# Patient Record
Sex: Female | Born: 1955 | Race: Black or African American | Hispanic: No | Smoking: Current every day smoker
Health system: Southern US, Community
[De-identification: ages and names within clinical notes are randomized; demographics above are authoritative.]

## PROBLEM LIST (undated history)

## (undated) ENCOUNTER — Emergency Department (HOSPITAL_COMMUNITY): Admission: EM | Discharge status: Absent without leave | Payer: Self-pay

## (undated) DIAGNOSIS — J189 Pneumonia, unspecified organism: Secondary | ICD-10-CM

## (undated) DIAGNOSIS — J45909 Unspecified asthma, uncomplicated: Secondary | ICD-10-CM

## (undated) DIAGNOSIS — M1990 Unspecified osteoarthritis, unspecified site: Secondary | ICD-10-CM

## (undated) DIAGNOSIS — J449 Chronic obstructive pulmonary disease, unspecified: Secondary | ICD-10-CM

## (undated) DIAGNOSIS — N83209 Unspecified ovarian cyst, unspecified side: Secondary | ICD-10-CM

## (undated) DIAGNOSIS — I10 Essential (primary) hypertension: Secondary | ICD-10-CM

## (undated) HISTORY — DX: Chronic obstructive pulmonary disease, unspecified: J44.9

## (undated) HISTORY — DX: Pneumonia, unspecified organism: J18.9

## (undated) HISTORY — DX: Unspecified asthma, uncomplicated: J45.909

## (undated) HISTORY — PX: TUBAL LIGATION: SHX77

## (undated) HISTORY — DX: Unspecified ovarian cyst, unspecified side: N83.209

---

## 2014-05-10 ENCOUNTER — Other Ambulatory Visit (HOSPITAL_COMMUNITY): Payer: Self-pay | Admitting: Family

## 2014-05-10 DIAGNOSIS — R1031 Right lower quadrant pain: Secondary | ICD-10-CM

## 2014-05-10 DIAGNOSIS — R1011 Right upper quadrant pain: Secondary | ICD-10-CM

## 2014-05-13 ENCOUNTER — Ambulatory Visit (HOSPITAL_COMMUNITY): Admission: RE | Admit: 2014-05-13 | Payer: 59 | Source: Ambulatory Visit

## 2014-05-24 ENCOUNTER — Encounter: Payer: Self-pay | Admitting: Obstetrics and Gynecology

## 2014-05-25 ENCOUNTER — Encounter: Payer: Self-pay | Admitting: *Deleted

## 2014-05-25 DIAGNOSIS — Z720 Tobacco use: Secondary | ICD-10-CM | POA: Insufficient documentation

## 2014-06-02 ENCOUNTER — Encounter: Payer: Self-pay | Admitting: Obstetrics and Gynecology

## 2015-08-12 ENCOUNTER — Observation Stay (HOSPITAL_COMMUNITY)
Admission: EM | Admit: 2015-08-12 | Discharge: 2015-08-15 | Disposition: A | Discharge status: Home / Self Care | Payer: 59 | Attending: Internal Medicine | Admitting: Internal Medicine

## 2015-08-12 ENCOUNTER — Emergency Department (HOSPITAL_COMMUNITY): Payer: 59

## 2015-08-12 ENCOUNTER — Encounter (HOSPITAL_COMMUNITY): Payer: Self-pay | Admitting: Emergency Medicine

## 2015-08-12 DIAGNOSIS — T50905A Adverse effect of unspecified drugs, medicaments and biological substances, initial encounter: Secondary | ICD-10-CM

## 2015-08-12 DIAGNOSIS — J441 Chronic obstructive pulmonary disease with (acute) exacerbation: Secondary | ICD-10-CM | POA: Diagnosis not present

## 2015-08-12 DIAGNOSIS — R0603 Acute respiratory distress: Secondary | ICD-10-CM | POA: Diagnosis present

## 2015-08-12 DIAGNOSIS — Z7982 Long term (current) use of aspirin: Secondary | ICD-10-CM | POA: Insufficient documentation

## 2015-08-12 DIAGNOSIS — J9600 Acute respiratory failure, unspecified whether with hypoxia or hypercapnia: Secondary | ICD-10-CM

## 2015-08-12 DIAGNOSIS — R739 Hyperglycemia, unspecified: Secondary | ICD-10-CM | POA: Diagnosis present

## 2015-08-12 DIAGNOSIS — F17210 Nicotine dependence, cigarettes, uncomplicated: Secondary | ICD-10-CM | POA: Insufficient documentation

## 2015-08-12 DIAGNOSIS — Z720 Tobacco use: Secondary | ICD-10-CM | POA: Diagnosis present

## 2015-08-12 DIAGNOSIS — J45909 Unspecified asthma, uncomplicated: Secondary | ICD-10-CM | POA: Insufficient documentation

## 2015-08-12 LAB — BASIC METABOLIC PANEL
ANION GAP: 9 (ref 5–15)
BUN: 12 mg/dL (ref 6–20)
CALCIUM: 9 mg/dL (ref 8.9–10.3)
CO2: 26 mmol/L (ref 22–32)
Chloride: 102 mmol/L (ref 101–111)
Creatinine, Ser: 0.82 mg/dL (ref 0.44–1.00)
GLUCOSE: 97 mg/dL (ref 65–99)
POTASSIUM: 3.7 mmol/L (ref 3.5–5.1)
Sodium: 137 mmol/L (ref 135–145)

## 2015-08-12 LAB — CBC WITH DIFFERENTIAL/PLATELET
BASOS ABS: 0 10*3/uL (ref 0.0–0.1)
BASOS PCT: 0 %
Eosinophils Absolute: 0 10*3/uL (ref 0.0–0.7)
Eosinophils Relative: 0 %
HEMATOCRIT: 45.4 % (ref 36.0–46.0)
HEMOGLOBIN: 14.7 g/dL (ref 12.0–15.0)
LYMPHS PCT: 48 %
Lymphs Abs: 5.3 10*3/uL — ABNORMAL HIGH (ref 0.7–4.0)
MCH: 30.8 pg (ref 26.0–34.0)
MCHC: 32.4 g/dL (ref 30.0–36.0)
MCV: 95.2 fL (ref 78.0–100.0)
MONO ABS: 1.2 10*3/uL — AB (ref 0.1–1.0)
Monocytes Relative: 11 %
NEUTROS ABS: 4.5 10*3/uL (ref 1.7–7.7)
NEUTROS PCT: 41 %
Platelets: 402 10*3/uL — ABNORMAL HIGH (ref 150–400)
RBC: 4.77 MIL/uL (ref 3.87–5.11)
RDW: 13.2 % (ref 11.5–15.5)
WBC: 11.1 10*3/uL — ABNORMAL HIGH (ref 4.0–10.5)

## 2015-08-12 MED ORDER — ACETAMINOPHEN 650 MG RE SUPP: 650.0000 mg | Freq: Four times a day (QID) | RECTAL | Status: DC | PRN

## 2015-08-12 MED ORDER — MOMETASONE FURO-FORMOTEROL FUM 200-5 MCG/ACT IN AERO
2.0000 | INHALATION_SPRAY | Freq: Two times a day (BID) | RESPIRATORY_TRACT | Status: DC
  Administered 2015-08-12 – 2015-08-15 (×6): 2 via RESPIRATORY_TRACT
  Filled 2015-08-12: qty 8.8

## 2015-08-12 MED ORDER — ALBUTEROL SULFATE (2.5 MG/3ML) 0.083% IN NEBU: 2.5000 mg | INHALATION_SOLUTION | Freq: Four times a day (QID) | RESPIRATORY_TRACT | Status: DC

## 2015-08-12 MED ORDER — ASPIRIN EC 81 MG PO TBEC
81.0000 mg | DELAYED_RELEASE_TABLET | Freq: Every day | ORAL | Status: DC
  Administered 2015-08-12 – 2015-08-15 (×4): 81 mg via ORAL
  Filled 2015-08-12 (×4): qty 1

## 2015-08-12 MED ORDER — IPRATROPIUM-ALBUTEROL 0.5-2.5 (3) MG/3ML IN SOLN
3.0000 mL | Freq: Four times a day (QID) | RESPIRATORY_TRACT | Status: DC
  Administered 2015-08-12: 3 mL via RESPIRATORY_TRACT
  Filled 2015-08-12: qty 3

## 2015-08-12 MED ORDER — METHYLPREDNISOLONE SODIUM SUCC 125 MG IJ SOLR
60.0000 mg | Freq: Two times a day (BID) | INTRAMUSCULAR | Status: DC
  Administered 2015-08-13 (×3): 60 mg via INTRAVENOUS
  Filled 2015-08-12 (×3): qty 2

## 2015-08-12 MED ORDER — ENOXAPARIN SODIUM 40 MG/0.4ML ~~LOC~~ SOLN
40.0000 mg | SUBCUTANEOUS | Status: DC
  Administered 2015-08-12 – 2015-08-14 (×3): 40 mg via SUBCUTANEOUS
  Filled 2015-08-12 (×3): qty 0.4

## 2015-08-12 MED ORDER — NICOTINE 21 MG/24HR TD PT24
21.0000 mg | MEDICATED_PATCH | Freq: Every day | TRANSDERMAL | Status: DC
  Administered 2015-08-12 – 2015-08-15 (×4): 21 mg via TRANSDERMAL
  Filled 2015-08-12 (×4): qty 1

## 2015-08-12 MED ORDER — LEVOFLOXACIN IN D5W 750 MG/150ML IV SOLN
750.0000 mg | Freq: Once | INTRAVENOUS | Status: AC
  Administered 2015-08-12: 750 mg via INTRAVENOUS
  Filled 2015-08-12: qty 150

## 2015-08-12 MED ORDER — IPRATROPIUM BROMIDE 0.02 % IN SOLN
1.0000 mg | Freq: Once | RESPIRATORY_TRACT | Status: AC
Start: 2015-08-12 — End: 2015-08-12
  Administered 2015-08-12: 1 mg via RESPIRATORY_TRACT
  Filled 2015-08-12: qty 5

## 2015-08-12 MED ORDER — ALBUTEROL (5 MG/ML) CONTINUOUS INHALATION SOLN
10.0000 mg/h | INHALATION_SOLUTION | Freq: Once | RESPIRATORY_TRACT | Status: AC
  Administered 2015-08-12: 10 mg/h via RESPIRATORY_TRACT
  Filled 2015-08-12: qty 20

## 2015-08-12 MED ORDER — MAGNESIUM SULFATE 2 GM/50ML IV SOLN
2.0000 g | Freq: Once | INTRAVENOUS | Status: AC
  Administered 2015-08-12: 2 g via INTRAVENOUS
  Filled 2015-08-12: qty 50

## 2015-08-12 MED ORDER — LEVOFLOXACIN IN D5W 750 MG/150ML IV SOLN
750.0000 mg | INTRAVENOUS | Status: DC
  Administered 2015-08-13: 750 mg via INTRAVENOUS
  Filled 2015-08-12: qty 150

## 2015-08-12 MED ORDER — POLYETHYLENE GLYCOL 3350 17 G PO PACK: 17.0000 g | PACK | Freq: Every day | ORAL | Status: DC | PRN

## 2015-08-12 MED ORDER — ONDANSETRON HCL 4 MG PO TABS: 4.0000 mg | ORAL_TABLET | Freq: Four times a day (QID) | ORAL | Status: DC | PRN

## 2015-08-12 MED ORDER — SODIUM CHLORIDE 0.9 % IV SOLN
INTRAVENOUS | Status: AC
  Administered 2015-08-12: 18:00:00 via INTRAVENOUS

## 2015-08-12 MED ORDER — ALBUTEROL SULFATE (2.5 MG/3ML) 0.083% IN NEBU
2.5000 mg | INHALATION_SOLUTION | RESPIRATORY_TRACT | Status: DC | PRN
  Administered 2015-08-13: 2.5 mg via RESPIRATORY_TRACT
  Filled 2015-08-12: qty 3

## 2015-08-12 MED ORDER — IPRATROPIUM BROMIDE 0.02 % IN SOLN: 0.5000 mg | Freq: Four times a day (QID) | RESPIRATORY_TRACT | Status: DC

## 2015-08-12 MED ORDER — GUAIFENESIN ER 600 MG PO TB12
600.0000 mg | ORAL_TABLET | Freq: Two times a day (BID) | ORAL | Status: DC
  Administered 2015-08-12 – 2015-08-15 (×6): 600 mg via ORAL
  Filled 2015-08-12 (×6): qty 1

## 2015-08-12 MED ORDER — TIOTROPIUM BROMIDE MONOHYDRATE 18 MCG IN CAPS
18.0000 ug | ORAL_CAPSULE | Freq: Every day | RESPIRATORY_TRACT | Status: DC
  Administered 2015-08-13: 18 ug via RESPIRATORY_TRACT
  Filled 2015-08-12: qty 5

## 2015-08-12 MED ORDER — ALBUTEROL SULFATE (2.5 MG/3ML) 0.083% IN NEBU: 2.5000 mg | INHALATION_SOLUTION | Freq: Once | RESPIRATORY_TRACT | Status: DC

## 2015-08-12 MED ORDER — ACETAMINOPHEN 325 MG PO TABS
650.0000 mg | ORAL_TABLET | Freq: Four times a day (QID) | ORAL | Status: DC | PRN
  Administered 2015-08-12: 650 mg via ORAL
  Filled 2015-08-12: qty 2

## 2015-08-12 MED ORDER — OXYCODONE HCL 5 MG PO TABS: 5.0000 mg | ORAL_TABLET | ORAL | Status: DC | PRN

## 2015-08-12 MED ORDER — ONDANSETRON HCL 4 MG/2ML IJ SOLN: 4.0000 mg | Freq: Four times a day (QID) | INTRAMUSCULAR | Status: DC | PRN

## 2015-08-12 MED ORDER — IPRATROPIUM-ALBUTEROL 0.5-2.5 (3) MG/3ML IN SOLN: 3.0000 mL | Freq: Once | RESPIRATORY_TRACT | Status: DC

## 2015-08-12 MED ORDER — METHYLPREDNISOLONE SODIUM SUCC 125 MG IJ SOLR
125.0000 mg | Freq: Once | INTRAMUSCULAR | Status: AC
  Administered 2015-08-12: 125 mg via INTRAVENOUS
  Filled 2015-08-12: qty 2

## 2015-08-12 NOTE — ED Provider Notes (Signed)
CSN: [id number]     Arrival date & time 08/12/15  1338 History   First MD Initiated Contact with Patient 08/12/15 1349     Chief Complaint  Patient presents with  . COPD    HPI  Pt was seen at 1355.  Per pt, c/o gradual onset and worsening of persistent cough, wheezing and SOB for the past 1 week.  Describes her symptoms as "my COPD is acting up."  Pt was evaluated by her PMD 4 days ago for same, rx prednisone and zithromax, in addition to her usual MDI. Pt has been taking these meds without change in her symptoms.  Denies CP/palpitations, no back pain, no abd pain, no N/V/D, no fevers, no rash.    Past Medical History  Diagnosis Date  . Ovarian cyst     Right ovarian cyst,   . Asthma   . COPD (chronic obstructive pulmonary disease) (HCC)   . Pneumonia    History reviewed. No pertinent past surgical history.   Family History  Problem Relation Age of Onset  . Hypertension Mother   . Stroke Brother    Social History  Substance Use Topics  . Smoking status: Current Every Day Smoker -- 0.50 packs/day    Types: Cigarettes  . Smokeless tobacco: Never Used  . Alcohol Use: No    Review of Systems ROS: Statement: All systems negative except as marked or noted in the HPI; Constitutional: Negative for fever and chills. ; ; Eyes: Negative for eye pain, redness and discharge. ; ; ENMT: Negative for ear pain, hoarseness, nasal congestion, sinus pressure and sore throat. ; ; Cardiovascular: Negative for chest pain, palpitations, diaphoresis, and peripheral edema. ; ; Respiratory: +cough, wheezing, SOB. Negative for stridor. ; ; Gastrointestinal: Negative for nausea, vomiting, diarrhea, abdominal pain, blood in stool, hematemesis, jaundice and rectal bleeding. . ; ; Genitourinary: Negative for dysuria, flank pain and hematuria. ; ; Musculoskeletal: Negative for back pain and neck pain. Negative for swelling and trauma.; ; Skin: Negative for pruritus, rash, abrasions, blisters, bruising and skin  lesion.; ; Neuro: Negative for headache, lightheadedness and neck stiffness. Negative for weakness, altered level of consciousness, altered mental status, extremity weakness, paresthesias, involuntary movement, seizure and syncope.      Allergies  Review of patient's allergies indicates no known allergies.  Home Medications   Prior to Admission medications   Medication Sig Start Date End Date Taking? Authorizing Provider  albuterol (PROVENTIL HFA;VENTOLIN HFA) 108 (90 BASE) MCG/ACT inhaler Inhale 2 puffs into the lungs every 6 (six) hours as needed for wheezing or shortness of breath.    Historical Provider, MD  Fluticasone-Salmeterol (ADVAIR) 500-50 MCG/DOSE AEPB Inhale 1 puff into the lungs 2 (two) times daily.    Historical Provider, MD  tiotropium (SPIRIVA) 18 MCG inhalation capsule Place 18 mcg into inhaler and inhale daily.    Historical Provider, MD   BP 175/89 mmHg  Pulse 108  Temp(Src) 97.6 F (36.4 C) (Tympanic)  Resp 18  Ht 5' (1.524 m)  Wt 158 lb (71.668 kg)  BMI 30.86 kg/m2  SpO2 96% Physical Exam 1400: Physical examination:  Nursing notes reviewed; Vital signs and O2 SAT reviewed;  Constitutional: Well developed, Well nourished, Well hydrated, Uncomfortable appearing.; Head:  Normocephalic, atraumatic; Eyes: EOMI, PERRL, No scleral icterus; ENMT: Mouth and pharynx normal, Mucous membranes moist; Neck: Supple, Full range of motion, No lymphadenopathy; Cardiovascular: Tachycardic rate and rhythm, No gallop; Respiratory: Breath sounds diminished & equal bilaterally, faint wheezes. No audible wheezing. Sitting  upright. Speaking phrases, tachypneic; Chest: Nontender, Movement normal; Abdomen: Soft, Nontender, Nondistended, Normal bowel sounds; Genitourinary: No CVA tenderness; Extremities: Pulses normal, No tenderness, No edema, No calf edema or asymmetry.; Neuro: AA&Ox3, Major CN grossly intact.  Speech clear. No gross focal motor or sensory deficits in extremities.; Skin: Color  normal, Warm, Dry.    ED Course  Procedures (including critical care time) Labs Review  Imaging Review  I have personally reviewed and evaluated these images and lab results as part of my medical decision-making.   EKG Interpretation None      MDM  MDM Reviewed: previous chart, nursing note and vitals Reviewed previous: labs Interpretation: labs and x-ray Total time providing critical care: 30-74 minutes. This excludes time spent performing separately reportable procedures and services. Consults: admitting MD   CRITICAL CARE Performed by: Laray AngerMCMANUS,Mathan Darroch M Total critical care time: 35 minutes Critical care time was exclusive of separately billable procedures and treating other patients. Critical care was necessary to treat or prevent imminent or life-threatening deterioration. Critical care was time spent personally by me on the following activities: development of treatment plan with patient and/or surrogate as well as nursing, discussions with consultants, evaluation of patient's response to treatment, examination of patient, obtaining history from patient or surrogate, ordering and performing treatments and interventions, ordering and review of laboratory studies, ordering and review of radiographic studies, pulse oximetry and re-evaluation of patient's condition.    Results for orders placed or performed during the hospital encounter of 08/12/15  Basic metabolic panel  Result Value Ref Range   Sodium 137 135 - 145 mmol/L   Potassium 3.7 3.5 - 5.1 mmol/L   Chloride 102 101 - 111 mmol/L   CO2 26 22 - 32 mmol/L   Glucose, Bld 97 65 - 99 mg/dL   BUN 12 6 - 20 mg/dL   Creatinine, Ser 0.450.82 0.44 - 1.00 mg/dL   Calcium 9.0 8.9 - 40.910.3 mg/dL   GFR calc non Af Amer >60 >60 mL/min   GFR calc Af Amer >60 >60 mL/min   Anion gap 9 5 - 15  CBC with Differential  Result Value Ref Range   WBC 11.1 (H) 4.0 - 10.5 K/uL   RBC 4.77 3.87 - 5.11 MIL/uL   Hemoglobin 14.7 12.0 - 15.0  g/dL   HCT 81.145.4 91.436.0 - 78.246.0 %   MCV 95.2 78.0 - 100.0 fL   MCH 30.8 26.0 - 34.0 pg   MCHC 32.4 30.0 - 36.0 g/dL   RDW 95.613.2 21.311.5 - 08.615.5 %   Platelets 402 (H) 150 - 400 K/uL   Neutrophils Relative % 41 %   Neutro Abs 4.5 1.7 - 7.7 K/uL   Lymphocytes Relative 48 %   Lymphs Abs 5.3 (H) 0.7 - 4.0 K/uL   Monocytes Relative 11 %   Monocytes Absolute 1.2 (H) 0.1 - 1.0 K/uL   Eosinophils Relative 0 %   Eosinophils Absolute 0.0 0.0 - 0.7 K/uL   Basophils Relative 0 %   Basophils Absolute 0.0 0.0 - 0.1 K/uL   Dg Chest 2 View 08/12/2015  CLINICAL DATA:  SOB X 1 WEEK, WORSENING, COPD, EMPHYSEMA, SMOKER 1 PK/DAY EXAM: CHEST  2 VIEW COMPARISON:  None. FINDINGS: Mild hyperinflation. Midline trachea. Normal heart size and mediastinal contours. No pleural effusion or pneumothorax. Clear lungs. IMPRESSION: Hyperinflation, without acute disease. Electronically Signed   By: Jeronimo GreavesKyle  Talbot M.D.   On: 08/12/2015 14:12    1545:  On arrival: pt sitting upright, tachypneic, tachycardic, Sats  92-95 % R/A, lungs diminished. IV solumedrol and hour long neb started. After neb: pt appears more comfortable at rest, less tachypneic, Sats 99 % R/A, lungs continue diminished/coarse with faint scattered wheezes. Pt ambulated in hallway: pt's O2 Sats remained 97-99 % R/A but pt c/o increasing SOB, with increasing HR and RR and had to stop after walking every few feet to rest. Lungs coarse with scattered wheezing, no audible wheezing. Pt escorted back to stretcher. Will admit. Dx and testing d/w pt and family.  Questions answered.  Verb understanding, agreeable to admit.  T/C to [facility] Dr. Adrian BlackwaterStinson, case discussed, including:  HPI, pertinent PM/SHx, VS/PE, dx testing, ED course and treatment:  Agreeable to admit, requests to start IV levaquin, write temporary orders, obtain medical bed to team APAdmits.   Samuel JesterKathleen Eilene Voigt, DO 08/17/15 1238

## 2015-08-12 NOTE — H&P (Signed)
History and Physical  Cassandra Montoya GMW:[id number] DOB: 12/15/1955 DOA: 08/12/2015  Referring physician: Dr Clarene DukeMcManus, ED physician [facility]  Outpatient Specialists: none  Chief Complaint: Shortness of breath  HPI: Cassandra Montoya is a 60 y.o. female with a history of COPD/asthma, tobacco abuse. Patient seen for 1 week of shortness of breath that has been worsening. Worse with exertion to about 20-30 feet, after which she has to stop and recover for approximately 30 minutes. Improved with rest. She was placed on Zithromax and steroid taper, which she has not improved on. Has exacerbations once to twice a year and is hospitalized approximately once a year. Does have productive cough with yellow sputum.  With the patient was ambulating after nebulizer treatment, patient had to stop every 10 feet or so to catch breath because she was so winded. Patient became quite tachypneic in our conversation giving 3-4 word answers despite not desaturating.   Review of Systems:   Pt denies any fevers, chills, nausea, vomiting, diarrhea, constipation, abdominal pain, orthopnea, wheezing, palpitations, headache, vision changes, lightheadedness, dizziness, melena, rectal bleeding.  Review of systems are otherwise negative  Past Medical History  Diagnosis Date  . Ovarian cyst     Right ovarian cyst,   . Asthma   . COPD (chronic obstructive pulmonary disease) (HCC)   . Pneumonia    History reviewed. No pertinent past surgical history. Social History:  reports that she has been smoking Cigarettes.  She has been smoking about 0.50 packs per day. She has never used smokeless tobacco. She reports that she does not drink alcohol or use illicit drugs. Patient lives at Home  No Known Allergies  Family History  Problem Relation Age of Onset  . Hypertension Mother   . Stroke Brother       Prior to Admission medications   Medication Sig Start Date End Date Taking? Authorizing  Provider  albuterol (PROVENTIL HFA;VENTOLIN HFA) 108 (90 BASE) MCG/ACT inhaler Inhale 2 puffs into the lungs every 6 (six) hours as needed for wheezing or shortness of breath.   Yes Historical Provider, MD  aspirin EC 81 MG tablet Take 81 mg by mouth daily.   Yes Historical Provider, MD  Fluticasone-Salmeterol (ADVAIR) 500-50 MCG/DOSE AEPB Inhale 1 puff into the lungs 2 (two) times daily.   Yes Historical Provider, MD  tiotropium (SPIRIVA) 18 MCG inhalation capsule Place 18 mcg into inhaler and inhale daily.   Yes Historical Provider, MD    Physical Exam: BP 188/91 mmHg  Pulse 105  Temp(Src) 97.6 F (36.4 C) (Tympanic)  Resp 21  Ht 5' (1.524 m)  Wt 71.668 kg (158 lb)  BMI 30.86 kg/m2  SpO2 99%  General: Older black female. Awake and alert and oriented x3. No acute cardiopulmonary distress.  HEENT: Normocephalic atraumatic.  Right and left ears normal in appearance.  Pupils equal, round, reactive to light. Extraocular muscles are intact. Sclerae anicteric and noninjected.  Moist mucosal membranes. No mucosal lesions.  Neck: Neck supple without lymphadenopathy. No carotid bruits. No masses palpated.  Cardiovascular: Regular rate with normal S1-S2 sounds. No murmurs, rubs, gallops auscultated. No JVD.  Respiratory: Lungs tight - diminished breath sounds with prolonged exhalation phase. Wheezing throughout. Possible rales in left base. Abdomen: Soft, nontender, nondistended. Active bowel sounds. No masses or hepatosplenomegaly  Skin: No rashes, lesions, or ulcerations.  Dry, warm to touch. 2+ dorsalis pedis and radial pulses. Musculoskeletal: No calf or leg pain. All major joints not erythematous  nontender.  No upper or lower joint deformation.  Good ROM.  No contractures  Psychiatric: Intact judgment and insight. Pleasant and cooperative. Neurologic: No focal neurological deficits. Strength is 5/5 and symmetric in upper and lower extremities.  Cranial nerves II through XII are grossly  intact.           Labs on Admission: I have personally reviewed following labs and imaging studies  CBC:  Recent Labs Lab 08/12/15 1434  WBC 11.1*  NEUTROABS 4.5  HGB 14.7  HCT 45.4  MCV 95.2  PLT 402*   Basic Metabolic Panel:  Recent Labs Lab 08/12/15 1434  NA 137  K 3.7  CL 102  CO2 26  GLUCOSE 97  BUN 12  CREATININE 0.82  CALCIUM 9.0   GFR: Estimated Creatinine Clearance: 64.5 mL/min (by C-G formula based on Cr of 0.82). Liver Function Tests: No results for input(s): AST, ALT, ALKPHOS, BILITOT, PROT, ALBUMIN in the last 168 hours. No results for input(s): LIPASE, AMYLASE in the last 168 hours. No results for input(s): AMMONIA in the last 168 hours. Coagulation Profile: No results for input(s): INR, PROTIME in the last 168 hours. Cardiac Enzymes: No results for input(s): CKTOTAL, CKMB, CKMBINDEX, TROPONINI in the last 168 hours. BNP (last 3 results) No results for input(s): PROBNP in the last 8760 hours. HbA1C: No results for input(s): HGBA1C in the last 72 hours. CBG: No results for input(s): GLUCAP in the last 168 hours. Lipid Profile: No results for input(s): CHOL, HDL, LDLCALC, TRIG, CHOLHDL, LDLDIRECT in the last 72 hours. Thyroid Function Tests: No results for input(s): TSH, T4TOTAL, FREET4, T3FREE, THYROIDAB in the last 72 hours. Anemia Panel: No results for input(s): VITAMINB12, FOLATE, FERRITIN, TIBC, IRON, RETICCTPCT in the last 72 hours. Urine analysis: No results found for: COLORURINE, APPEARANCEUR, LABSPEC, PHURINE, GLUCOSEU, HGBUR, BILIRUBINUR, KETONESUR, PROTEINUR, UROBILINOGEN, NITRITE, LEUKOCYTESUR Sepsis Labs: @LABRCNTIP (procalcitonin:4,lacticidven:4) )No results found for this or any previous visit (from the past 240 hour(s)).   Radiological Exams on Admission: Dg Chest 2 View  08/12/2015  CLINICAL DATA:  SOB X 1 WEEK, WORSENING, COPD, EMPHYSEMA, SMOKER 1 PK/DAY EXAM: CHEST  2 VIEW COMPARISON:  None. FINDINGS: Mild hyperinflation.  Midline trachea. Normal heart size and mediastinal contours. No pleural effusion or pneumothorax. Clear lungs. IMPRESSION: Hyperinflation, without acute disease. Electronically Signed   By: Jeronimo GreavesKyle  Talbot M.D.   On: 08/12/2015 14:12    Assessment/Plan: Principal Problem:   Acute respiratory failure (HCC) Active Problems:   Tobacco abuse   COPD with acute exacerbation [facility](HCC)    This patient was discussed with the ED physician, including pertinent vitals, physical exam findings, labs, and imaging.  We also discussed care given by the ED provider.  #1 acute respiratory failure  Admit to MedSurg  Oxygen therapy #2 COPD with acute exacerbation Antibiotics: Levaquin DuoNeb's every 6 scheduled with albuterol every 2 when necessary Continue inhaled steroids and LA bronchodilator Solu-Medrol 60 mg IV every 12 hours Mucinex #3 tobacco abuse  Nicoderm patch  DVT prophylaxis: Lovenox Consultants: None Code Status: Full code Family Communication: Son  Disposition Plan: Admit to MedSurg - plan to return home   Levie HeritageJacob J Kristyne Woodring, DO [facility] Pager [phone]  If 7PM-7AM, please contact night-coverage [email or web address] Password [email or web address]

## 2015-08-12 NOTE — ED Notes (Signed)
Pt c/o sob/cough x one week. Pt seen by PCP on Monday-has been taking inhalers, po prednisone and azithromycin with no relief. Pt states she does not have nebulizer prescriptions.

## 2015-08-12 NOTE — ED Notes (Signed)
EDP updated and assessed pt while ambulating in hallway, RA sats never dropped but pt c/o having difficulty breathing prior, during and after ambulation

## 2015-08-13 DIAGNOSIS — T50905A Adverse effect of unspecified drugs, medicaments and biological substances, initial encounter: Secondary | ICD-10-CM

## 2015-08-13 DIAGNOSIS — R0600 Dyspnea, unspecified: Secondary | ICD-10-CM | POA: Diagnosis not present

## 2015-08-13 DIAGNOSIS — R739 Hyperglycemia, unspecified: Secondary | ICD-10-CM

## 2015-08-13 DIAGNOSIS — Z720 Tobacco use: Secondary | ICD-10-CM

## 2015-08-13 DIAGNOSIS — J441 Chronic obstructive pulmonary disease with (acute) exacerbation: Secondary | ICD-10-CM

## 2015-08-13 LAB — CBC
HEMATOCRIT: 42.1 % (ref 36.0–46.0)
Hemoglobin: 13.7 g/dL (ref 12.0–15.0)
MCH: 31 pg (ref 26.0–34.0)
MCHC: 32.5 g/dL (ref 30.0–36.0)
MCV: 95.2 fL (ref 78.0–100.0)
Platelets: 365 10*3/uL (ref 150–400)
RBC: 4.42 MIL/uL (ref 3.87–5.11)
RDW: 13.2 % (ref 11.5–15.5)
WBC: 12.9 10*3/uL — ABNORMAL HIGH (ref 4.0–10.5)

## 2015-08-13 LAB — BASIC METABOLIC PANEL
ANION GAP: 5 (ref 5–15)
BUN: 13 mg/dL (ref 6–20)
CALCIUM: 8.5 mg/dL — AB (ref 8.9–10.3)
CHLORIDE: 106 mmol/L (ref 101–111)
CO2: 27 mmol/L (ref 22–32)
Creatinine, Ser: 0.55 mg/dL (ref 0.44–1.00)
GFR calc non Af Amer: >60 mL/min (ref >60)
Glucose, Bld: 133 mg/dL — ABNORMAL HIGH (ref 65–99)
Potassium: 4.5 mmol/L (ref 3.5–5.1)
SODIUM: 138 mmol/L (ref 135–145)

## 2015-08-13 LAB — GLUCOSE, CAPILLARY
GLUCOSE-CAPILLARY: 122 mg/dL — AB (ref 65–99)
GLUCOSE-CAPILLARY: 202 mg/dL — AB (ref 65–99)
GLUCOSE-CAPILLARY: 120 mg/dL — AB (ref 65–99)

## 2015-08-13 MED ORDER — INSULIN ASPART 100 UNIT/ML ~~LOC~~ SOLN: 0.0000 [IU] | Freq: Every day | SUBCUTANEOUS | Status: DC

## 2015-08-13 MED ORDER — INSULIN ASPART 100 UNIT/ML ~~LOC~~ SOLN
0.0000 [IU] | Freq: Three times a day (TID) | SUBCUTANEOUS | Status: DC
  Administered 2015-08-13 – 2015-08-14 (×2): 2 [IU] via SUBCUTANEOUS

## 2015-08-13 MED ORDER — ALBUTEROL SULFATE (2.5 MG/3ML) 0.083% IN NEBU
2.5000 mg | INHALATION_SOLUTION | Freq: Four times a day (QID) | RESPIRATORY_TRACT | Status: DC
  Administered 2015-08-13: 2.5 mg via RESPIRATORY_TRACT
  Filled 2015-08-13: qty 3

## 2015-08-13 MED ORDER — IPRATROPIUM-ALBUTEROL 0.5-2.5 (3) MG/3ML IN SOLN
3.0000 mL | Freq: Four times a day (QID) | RESPIRATORY_TRACT | Status: DC
  Administered 2015-08-13 – 2015-08-14 (×4): 3 mL via RESPIRATORY_TRACT
  Filled 2015-08-13 (×4): qty 3

## 2015-08-13 MED ORDER — BENZONATATE 100 MG PO CAPS: 100.0000 mg | ORAL_CAPSULE | Freq: Three times a day (TID) | ORAL | Status: DC | PRN

## 2015-08-13 NOTE — Progress Notes (Signed)
PROGRESS NOTE    Cassandra Montoya  ZOX:[id number] DOB: 02/22/1955 DOA: 08/12/2015 [facility]    Brief Narrative:  Patient is a 60 year old woman with a history of COPD/asthma and tobacco use, who presented to the ED on 08/12/2015 with a chief complaint of shortness of breath and chest congestion. In the ED, she was afebrile, short of breath, mildly tachycardic, and with an elevated blood pressure. Her oxygen saturations were in the low 90s on room air. Her chest x-ray revealed hyperinflation, but without acute disease. She was admitted for further evaluation and management.   Assessment & Plan:   Principal Problem:   Acute respiratory distress (HCC) Active Problems:   COPD with acute exacerbation (HCC)   Tobacco abuse   Hyperglycemia, drug-induced   1. Acute respiratory distress secondary to COPD exacerbation. The patient was given a continuous albuterol nebulizer in the ED. Following admission, she was started on treatment with 4 times a day albuterol nebulizer, Spiriva inhaler, IV Solu-Medrol, and IV Levaquin. Dulera inhaler was started. Oxygen was added for comfort. Patient is slightly symptomatically improved. -Have changed bronchodilator to DuoNeb every 6 hours; discontinue Spiriva; and continue when necessary albuterol nebulizer. -Have added Tessalon Perles as needed for cough. -Patient was strongly advised to stop smoking. We'll continue nicotine patch.  Hyperglycemia, likely steroid-induced. -Sliding-scale NovoLog has been started.    DVT prophylaxis: Lovenox Code Status: Full code Family Communication: Discussed with son Disposition Plan: discharge to home when clinically appropriate, likely in the next 24-48 hours.    Consultants:   None  Procedures:   None  Antimicrobials:  Levaquin 08/12/15   Subjective: Patient says she is breathing better. She has a mild cough with clear sputum. She denies chest pain or  pleurisy.  Objective: Filed Vitals:   08/13/15 0615 08/13/15 0733 08/13/15 0736 08/13/15 0743  BP: 143/72     Pulse: 64     Temp: 97.5 F (36.4 C)     TempSrc: Oral     Resp: 17     Height:      Weight:      SpO2: 99% 95% 90% 96%    Intake/Output Summary (Last 24 hours) at 08/13/15 1000 Last data filed at 08/13/15 0835  Gross per 24 hour  Intake    120 ml  Output      0 ml  Net    120 ml   Filed Weights   08/12/15 1345 08/12/15 1716  Weight: 71.668 kg (158 lb) 52.663 kg (116 lb 1.6 oz)    Examination:  General exam: Appears calm and comfortable  Respiratory system: Scattered wheezes and crackles Respiratory effort normal. Cardiovascular system: S1 & S2 heard, RRR. No JVD, murmurs, rubs, gallops or clicks. No pedal edema. Gastrointestinal system: Abdomen is nondistended, soft and nontender. No organomegaly or masses felt. Normal bowel sounds heard. Central nervous system: Alert and oriented. No focal neurological deficits. Extremities: Symmetric 5 x 5 power. Skin: No rashes, lesions or ulcers Psychiatry: Judgement and insight appear normal. Mood & affect appropriate.     Data Reviewed: I have personally reviewed following labs and imaging studies  CBC:  Recent Labs Lab 08/12/15 1434 08/13/15 0530  WBC 11.1* 12.9*  NEUTROABS 4.5  --   HGB 14.7 13.7  HCT 45.4 42.1  MCV 95.2 95.2  PLT 402* 365   Basic Metabolic Panel:  Recent Labs Lab 08/12/15 1434 08/13/15 0530  NA 137 138  K 3.7 4.5  CL 102 106  CO2 26 27  GLUCOSE 97 133*  BUN 12 13  CREATININE 0.82 0.55  CALCIUM 9.0 8.5*   GFR: Estimated Creatinine Clearance: 56.4 mL/min (by C-G formula based on Cr of 0.55). Liver Function Tests: No results for input(s): AST, ALT, ALKPHOS, BILITOT, PROT, ALBUMIN in the last 168 hours. No results for input(s): LIPASE, AMYLASE in the last 168 hours. No results for input(s): AMMONIA in the last 168 hours. Coagulation Profile: No results for input(s): INR,  PROTIME in the last 168 hours. Cardiac Enzymes: No results for input(s): CKTOTAL, CKMB, CKMBINDEX, TROPONINI in the last 168 hours. BNP (last 3 results) No results for input(s): PROBNP in the last 8760 hours. HbA1C: No results for input(s): HGBA1C in the last 72 hours. CBG: No results for input(s): GLUCAP in the last 168 hours. Lipid Profile: No results for input(s): CHOL, HDL, LDLCALC, TRIG, CHOLHDL, LDLDIRECT in the last 72 hours. Thyroid Function Tests: No results for input(s): TSH, T4TOTAL, FREET4, T3FREE, THYROIDAB in the last 72 hours. Anemia Panel: No results for input(s): VITAMINB12, FOLATE, FERRITIN, TIBC, IRON, RETICCTPCT in the last 72 hours. Sepsis Labs: No results for input(s): PROCALCITON, LATICACIDVEN in the last 168 hours.  No results found for this or any previous visit (from the past 240 hour(s)).       Radiology Studies: Dg Chest 2 View  08/12/2015  CLINICAL DATA:  SOB X 1 WEEK, WORSENING, COPD, EMPHYSEMA, SMOKER 1 PK/DAY EXAM: CHEST  2 VIEW COMPARISON:  None. FINDINGS: Mild hyperinflation. Midline trachea. Normal heart size and mediastinal contours. No pleural effusion or pneumothorax. Clear lungs. IMPRESSION: Hyperinflation, without acute disease. Electronically Signed   By: Jeronimo GreavesKyle  Talbot M.D.   On: 08/12/2015 14:12        Scheduled Meds: . aspirin EC  81 mg Oral Daily  . enoxaparin (LOVENOX) injection  40 mg Subcutaneous Q24H  . guaiFENesin  600 mg Oral BID  . insulin aspart  0-15 Units Subcutaneous TID WC  . insulin aspart  0-5 Units Subcutaneous QHS  . ipratropium-albuterol  3 mL Nebulization Q6H  . levofloxacin (LEVAQUIN) IV  750 mg Intravenous Q24H  . methylPREDNISolone (SOLU-MEDROL) injection  60 mg Intravenous Q12H  . mometasone-formoterol  2 puff Inhalation BID  . nicotine  21 mg Transdermal Daily   Continuous Infusions:       Time spent: 30 minutes    Elliot CousinFISHER,Cassandra Mauney, MD [facility] Pager [phone]  If 7PM-7AM, please  contact night-coverage [email or web address] Password [email or web address] 08/13/2015, 10:00 AM

## 2015-08-14 DIAGNOSIS — J441 Chronic obstructive pulmonary disease with (acute) exacerbation: Secondary | ICD-10-CM | POA: Diagnosis not present

## 2015-08-14 DIAGNOSIS — Z720 Tobacco use: Secondary | ICD-10-CM | POA: Diagnosis not present

## 2015-08-14 DIAGNOSIS — R739 Hyperglycemia, unspecified: Secondary | ICD-10-CM | POA: Diagnosis not present

## 2015-08-14 DIAGNOSIS — R0600 Dyspnea, unspecified: Secondary | ICD-10-CM | POA: Diagnosis not present

## 2015-08-14 LAB — GLUCOSE, CAPILLARY
GLUCOSE-CAPILLARY: 143 mg/dL — AB (ref 65–99)
GLUCOSE-CAPILLARY: 183 mg/dL — AB (ref 65–99)
Glucose-Capillary: 108 mg/dL — ABNORMAL HIGH (ref 65–99)
Glucose-Capillary: 107 mg/dL — ABNORMAL HIGH (ref 65–99)

## 2015-08-14 LAB — CBC
HEMATOCRIT: 40 % (ref 36.0–46.0)
HEMOGLOBIN: 12.8 g/dL (ref 12.0–15.0)
MCH: 30.9 pg (ref 26.0–34.0)
MCHC: 32 g/dL (ref 30.0–36.0)
MCV: 96.6 fL (ref 78.0–100.0)
Platelets: 380 10*3/uL (ref 150–400)
RBC: 4.14 MIL/uL (ref 3.87–5.11)
RDW: 13.5 % (ref 11.5–15.5)
WBC: 19.6 10*3/uL — ABNORMAL HIGH (ref 4.0–10.5)

## 2015-08-14 MED ORDER — IPRATROPIUM-ALBUTEROL 0.5-2.5 (3) MG/3ML IN SOLN
3.0000 mL | Freq: Three times a day (TID) | RESPIRATORY_TRACT | Status: DC
  Administered 2015-08-14 – 2015-08-15 (×3): 3 mL via RESPIRATORY_TRACT
  Filled 2015-08-14 (×3): qty 3

## 2015-08-14 MED ORDER — PREDNISONE 20 MG PO TABS
60.0000 mg | ORAL_TABLET | Freq: Two times a day (BID) | ORAL | Status: DC
  Administered 2015-08-14: 60 mg via ORAL
  Filled 2015-08-14: qty 3

## 2015-08-14 MED ORDER — LEVOFLOXACIN 750 MG PO TABS
750.0000 mg | ORAL_TABLET | ORAL | Status: DC
  Administered 2015-08-14: 750 mg via ORAL
  Filled 2015-08-14: qty 1

## 2015-08-14 NOTE — Progress Notes (Signed)
PROGRESS NOTE    Cassandra Montoya  HYQ:[id number] DOB: 11/08/1955 DOA: 08/12/2015 [facility]    Brief Narrative:  Patient is a 60 year old woman with a history of COPD/asthma and tobacco use, who presented to the ED on 08/12/2015 with a chief complaint of shortness of breath and chest congestion. In the ED, she was afebrile, short of breath, mildly tachycardic, and with an elevated blood pressure. Her oxygen saturations were in the low 90s on room air. Her chest x-ray revealed hyperinflation, but without acute disease. She was admitted for further evaluation and management.   Assessment & Plan:   Principal Problem:   Acute respiratory distress (HCC) Active Problems:   COPD with acute exacerbation (HCC)   Tobacco abuse   Hyperglycemia, drug-induced   1. Acute respiratory distress secondary to COPD exacerbation. The patient was given a continuous albuterol nebulizer in the ED. Following admission, she was started on treatment with 4 times a day albuterol nebulizer, Spiriva inhaler, IV Solu-Medrol, and IV Levaquin. Dulera inhaler was started. Oxygen was added for comfort. Patient is improving clinically and symptomatically, but may need another 24 hours of steroid tapering and reassessment. -Bronchodilators changed to DuoNeb every 6 hours; discontinue Spiriva; and continue when necessary albuterol nebulizer. Tessalon Perles added as needed for cough. The patient was strongly advised to stop smoking. Nicotine patch was ordered and was continued. -Will change steroid therapy to oral prednisone. -We'll ambulate the patient today and reassess.  Hyperglycemia, likely steroid-induced. -Sliding-scale NovoLog was started for treatment.    DVT prophylaxis: Lovenox Code Status: Full code Family Communication: Discussed with son Disposition Plan: discharge to home when clinically appropriate, likely tomorrow.    Consultants:   None  Procedures:    None  Antimicrobials:  Levaquin 08/12/15   Subjective: Patient says she is breathing better, but acknowledged some shortness breath when she walks to the bathroom.  Objective: Filed Vitals:   08/14/15 0132 08/14/15 0634 08/14/15 0739 08/14/15 0745  BP:  144/59    Pulse:  58    Temp:  97.7 F (36.5 C)    TempSrc:  Oral    Resp:  15    Height:      Weight:      SpO2: 98% 100% 98% 99%    Intake/Output Summary (Last 24 hours) at 08/14/15 1031 Last data filed at 08/13/15 1700  Gross per 24 hour  Intake    360 ml  Output      0 ml  Net    360 ml   Filed Weights   08/12/15 1345 08/12/15 1716  Weight: 71.668 kg (158 lb) 52.663 kg (116 lb 1.6 oz)    Examination:  General exam: Appears calm and comfortable  Respiratory system: Scattered wheezes and crackles, Decreased from yesterday. Respiratory effort normal. Cardiovascular system: S1 & S2 heard, RRR. No JVD, murmurs, rubs, gallops or clicks. No pedal edema. Gastrointestinal system: Abdomen is nondistended, soft and nontender. No organomegaly or masses felt. Normal bowel sounds heard. Central nervous system: Alert and oriented. No focal neurological deficits. Extremities: Symmetric 5 x 5 power. Skin: No rashes, lesions or ulcers Psychiatry: Judgement and insight appear normal. Mood & affect appropriate.     Data Reviewed: I have personally reviewed following labs and imaging studies  CBC:  Recent Labs Lab 08/12/15 1434 08/13/15 0530 08/14/15 0453  WBC 11.1* 12.9* 19.6*  NEUTROABS 4.5  --   --   HGB 14.7 13.7 12.8  HCT 45.4 42.1 40.0  MCV 95.2 95.2 96.6  PLT 402* 365 380   Basic Metabolic Panel:  Recent Labs Lab 08/12/15 1434 08/13/15 0530  NA 137 138  K 3.7 4.5  CL 102 106  CO2 26 27  GLUCOSE 97 133*  BUN 12 13  CREATININE 0.82 0.55  CALCIUM 9.0 8.5*   GFR: Estimated Creatinine Clearance: 56.4 mL/min (by C-G formula based on Cr of 0.55). Liver Function Tests: No results for input(s): AST,  ALT, ALKPHOS, BILITOT, PROT, ALBUMIN in the last 168 hours. No results for input(s): LIPASE, AMYLASE in the last 168 hours. No results for input(s): AMMONIA in the last 168 hours. Coagulation Profile: No results for input(s): INR, PROTIME in the last 168 hours. Cardiac Enzymes: No results for input(s): CKTOTAL, CKMB, CKMBINDEX, TROPONINI in the last 168 hours. BNP (last 3 results) No results for input(s): PROBNP in the last 8760 hours. HbA1C: No results for input(s): HGBA1C in the last 72 hours. CBG:  Recent Labs Lab 08/13/15 1128 08/13/15 1718 08/13/15 2202 08/14/15 0731  GLUCAP 122* 202* 120* 143*   Lipid Profile: No results for input(s): CHOL, HDL, LDLCALC, TRIG, CHOLHDL, LDLDIRECT in the last 72 hours. Thyroid Function Tests: No results for input(s): TSH, T4TOTAL, FREET4, T3FREE, THYROIDAB in the last 72 hours. Anemia Panel: No results for input(s): VITAMINB12, FOLATE, FERRITIN, TIBC, IRON, RETICCTPCT in the last 72 hours. Sepsis Labs: No results for input(s): PROCALCITON, LATICACIDVEN in the last 168 hours.  No results found for this or any previous visit (from the past 240 hour(s)).       Radiology Studies: Dg Chest 2 View  08/12/2015  CLINICAL DATA:  SOB X 1 WEEK, WORSENING, COPD, EMPHYSEMA, SMOKER 1 PK/DAY EXAM: CHEST  2 VIEW COMPARISON:  None. FINDINGS: Mild hyperinflation. Midline trachea. Normal heart size and mediastinal contours. No pleural effusion or pneumothorax. Clear lungs. IMPRESSION: Hyperinflation, without acute disease. Electronically Signed   By: Jeronimo GreavesKyle  Talbot M.D.   On: 08/12/2015 14:12        Scheduled Meds: . aspirin EC  81 mg Oral Daily  . enoxaparin (LOVENOX) injection  40 mg Subcutaneous Q24H  . guaiFENesin  600 mg Oral BID  . insulin aspart  0-15 Units Subcutaneous TID WC  . insulin aspart  0-5 Units Subcutaneous QHS  . ipratropium-albuterol  3 mL Nebulization TID  . levofloxacin  750 mg Oral Q24H  . methylPREDNISolone (SOLU-MEDROL)  injection  60 mg Intravenous Q12H  . mometasone-formoterol  2 puff Inhalation BID  . nicotine  21 mg Transdermal Daily   Continuous Infusions:       Time spent: 25 minutes    Elliot CousinFISHER,Lona Six, MD [facility] Pager [phone]  If 7PM-7AM, please contact night-coverage [email or web address] Password [email or web address] 08/14/2015, 10:31 AM

## 2015-08-14 NOTE — Progress Notes (Signed)
Pt ambulated in hallway with son without using oxygen. Walk was well tolerated, pt returned back to room and sat on the bed. Pt did not show any signs of distress, breathing was regular and non labored. Resting oxygen sat on room air was 99%.

## 2015-08-15 DIAGNOSIS — R739 Hyperglycemia, unspecified: Secondary | ICD-10-CM | POA: Diagnosis not present

## 2015-08-15 DIAGNOSIS — Z720 Tobacco use: Secondary | ICD-10-CM | POA: Diagnosis not present

## 2015-08-15 DIAGNOSIS — J441 Chronic obstructive pulmonary disease with (acute) exacerbation: Secondary | ICD-10-CM | POA: Diagnosis not present

## 2015-08-15 DIAGNOSIS — R0600 Dyspnea, unspecified: Secondary | ICD-10-CM | POA: Diagnosis not present

## 2015-08-15 LAB — GLUCOSE, CAPILLARY
GLUCOSE-CAPILLARY: 110 mg/dL — AB (ref 65–99)
Glucose-Capillary: 86 mg/dL (ref 65–99)

## 2015-08-15 MED ORDER — LEVOFLOXACIN 750 MG PO TABS
750.0000 mg | ORAL_TABLET | ORAL | Status: AC
  Administered 2015-08-15: 750 mg via ORAL
  Filled 2015-08-15: qty 1

## 2015-08-15 MED ORDER — BENZONATATE 100 MG PO CAPS: 100.0000 mg | ORAL_CAPSULE | Freq: Three times a day (TID) | ORAL | Status: DC

## 2015-08-15 MED ORDER — FLUTICASONE-SALMETEROL 500-50 MCG/DOSE IN AEPB: 1.0000 | INHALATION_SPRAY | Freq: Two times a day (BID) | RESPIRATORY_TRACT | Status: DC

## 2015-08-15 MED ORDER — PREDNISONE 10 MG PO TABS: ORAL_TABLET | ORAL | Status: DC

## 2015-08-15 MED ORDER — LEVOFLOXACIN 750 MG PO TABS: 750.0000 mg | ORAL_TABLET | Freq: Every day | ORAL | Status: DC

## 2015-08-15 MED ORDER — PREDNISONE 20 MG PO TABS
40.0000 mg | ORAL_TABLET | Freq: Two times a day (BID) | ORAL | Status: DC
  Administered 2015-08-15: 40 mg via ORAL
  Filled 2015-08-15: qty 2

## 2015-08-15 MED ORDER — TIOTROPIUM BROMIDE MONOHYDRATE 18 MCG IN CAPS: 18.0000 ug | ORAL_CAPSULE | Freq: Every day | RESPIRATORY_TRACT | Status: DC

## 2015-08-15 NOTE — Progress Notes (Signed)
Patient discharged home.  IV removed - WNL.  Reviewed DC instructions and medications.  Educational handouts given.  Encouraged to stop smoking.  Follow up appt in place.  Verbalized understanding. No questions at this time, assisted off unit via WC in NAD.

## 2015-08-15 NOTE — Care Management Note (Signed)
Case Management Note  Patient Details  Name: Cassandra Montoya MRN: [id number] Date of Birth: 06/09/1955  Subjective/Objective:                  Pt is from home, lives with her son and is ind with ADL's. Pt is employed. Pt plans to return home with self care. Pt has no home O2 or neb prior to admission. Pt will not need these at DC.   Action/Plan: Pt discharging home today. No CM needs.   Expected Discharge Date:     08/15/2015             Expected Discharge Plan:  Home/Self Care  In-House Referral:  NA  Discharge planning Services  CM Consult  Post Acute Care Choice:  NA Choice offered to:  NA  DME Arranged:    DME Agency:     HH Arranged:    HH Agency:     Status of Service:  Completed, signed off  If discussed at [employer] Length of Stay Meetings, dates discussed:    Additional Comments:  Malcolm MetroChildress, Travanti Mcmanus Demske, RN 08/15/2015, 1:05 PM

## 2015-08-15 NOTE — Discharge Summary (Signed)
Physician Discharge Summary  Cassandra Montoya ZHY:[id number] DOB: 1955/04/06 DOA: 08/12/2015  PCP: [facility]  Admit date: 08/12/2015 Discharge date: 08/15/2015  Time spent: Greater than 30 minutes  Recommendations for Outpatient Follow-up:  1. Recommend follow-up with the patient's blood pressure off of prednisone; to assess for need of BP medication. 2. Recommend follow-up of tobacco cessation. 3. Recommend follow-up of her blood glucose and WBC off of steroids.    Discharge Diagnoses:  1. COPD with acute exacerbation. 2. Acute respiratory distress secondary to #1. 3. Tobacco abuse. Patient advised to stop smoking. 4. Steroid-induced hyperglycemia. 5. Steroid-induced leukocytosis. 6. Elevated blood pressure without diagnosis of hypertension.  Discharge Condition: Improved.  Diet recommendation: Heart healthy.  Filed Weights   08/12/15 1345 08/12/15 1716  Weight: 71.668 kg (158 lb) 52.663 kg (116 lb 1.6 oz)    History of present illness:  Patient is a 60 year old woman with a history of COPD/asthma and tobacco use, who presented to the ED on 08/12/2015 with a chief complaint of shortness of breath and chest congestion. In the ED, she was afebrile, short of breath, mildly tachycardic, and with an elevated blood pressure. Her oxygen saturations were in the low 90s on room air. Her chest x-ray revealed hyperinflation, but without acute disease. She was admitted for further evaluation and management.   Hospital Course:  1. Acute respiratory distress secondary to COPD exacerbation. The patient was given a continuous albuterol nebulizer in the ED. Following admission, she was started on treatment with 4 times a day albuterol nebulizer, Spiriva inhaler, IV Solu-Medrol, and IV Levaquin. Dulera inhaler was started for replacement of Advair. Oxygen was added for comfort. Spiriva was later discontinued in favor of Atrovent nebulizer. Nicotine patch was ordered and  given. She was encouraged to stop smoking completely. -She improved clinically and symptomatically. She was able to ambulate in the hallway without oxygen while still saturating in the mid to upper 90s on room air. -She was discharged home on a prednisone taper, Levaquin, and Tessalon Perles. She was instructed to restart Spiriva inhaler and Advair inhaler and to use albuterol MDI as needed.  Elevated blood pressure. Patient has no history of hypertension. Her blood pressure gradually increased during the hospitalization. This was thought to be secondary to IV fluids and IV Solu-Medrol. Antihypertensive medication was not given. Would rather that she be reassessed in the outpatient setting off of steroids. Starting a BP medication will be left up to the discretion of her PCP.  Hyperglycemia and leukocytosis, likely steroid-induced. Patient's venous glucose was mildly elevated at 122 on admission. This was following IV Solu-Medrol in the ED. It gradually increased. Sliding scale NovoLog was started. Her white blood cell count was 11.1 on admission, but it progressively increased. This was thought to be secondary to IV Solu-Medrol. She remained afebrile during the hospitalization. -Recommend outpatient follow-up of her blood glucose and WBC.  Procedures:  None  Consultations:  None  Discharge Exam: Filed Vitals:   08/14/15 2233 08/15/15 0500  BP: 142/75 165/94  Pulse: 68 82  Temp: 98 F (36.7 C) 97.9 F (36.6 C)  Resp: 20 18  Oxygen saturations 97% on room air.  General: Pleasant 60 year old African-American woman in no acute distress. Cardiovascular: S1, S2, with a soft systolic murmur. Respiratory: Coarse breath sounds, but no audible wheezes. Breathing nonlabored at rest and with activity.  Discharge Instructions   Discharge Instructions    Diet - low sodium heart healthy    Complete by:  As directed      Discharge instructions    Complete by:  As directed   Do not smoke.  Eat less sugar and salt in your diet.     Increase activity slowly    Complete by:  As directed           Current Discharge Medication List    START taking these medications   Details  benzonatate (TESSALON) 100 MG capsule Take 1 capsule (100 mg total) by mouth 3 (three) times daily. FOR COUGH Qty: 20 capsule, Refills: 0    levofloxacin (LEVAQUIN) 750 MG tablet Take 1 tablet (750 mg total) by mouth daily. Antibiotic. Starting 08/16/2015, take 1 tablet daily for 3 more days. Qty: 3 tablet, Refills: 0    predniSONE (DELTASONE) 10 MG tablet STARTING 08/16/15 takes 5 tablets daily for 1 day; then 4 tablets the next day for 1 day; then 3 tablets the next day for 1 day; then 2 tablets the next day for 1 day; then 1 tablet the next day for 1 day; then stop. Qty: 16 tablet, Refills: 0      CONTINUE these medications which have CHANGED   Details  Fluticasone-Salmeterol (ADVAIR) 500-50 MCG/DOSE AEPB Inhale 1 puff into the lungs 2 (two) times daily. Qty: 60 each, Refills: 2    tiotropium (SPIRIVA) 18 MCG inhalation capsule Place 1 capsule (18 mcg total) into inhaler and inhale daily. Qty: 30 capsule, Refills: 2      CONTINUE these medications which have NOT CHANGED   Details  albuterol (PROVENTIL HFA;VENTOLIN HFA) 108 (90 BASE) MCG/ACT inhaler Inhale 2 puffs into the lungs every 6 (six) hours as needed for wheezing or shortness of breath.    aspirin EC 81 MG tablet Take 81 mg by mouth daily.       No Known Allergies Follow-up Information    [facility].   Why:  at 11:45 am   Contact information:   [phone]       The results of significant diagnostics from this hospitalization (including imaging, microbiology, ancillary and laboratory) are listed below for reference.    Significant Diagnostic Studies: Dg Chest 2 View  08/12/2015  CLINICAL DATA:  SOB X 1 WEEK, WORSENING, COPD, EMPHYSEMA, SMOKER 1 PK/DAY EXAM: CHEST  2 VIEW  COMPARISON:  None. FINDINGS: Mild hyperinflation. Midline trachea. Normal heart size and mediastinal contours. No pleural effusion or pneumothorax. Clear lungs. IMPRESSION: Hyperinflation, without acute disease. Electronically Signed   By: Jeronimo GreavesKyle  Talbot M.D.   On: 08/12/2015 14:12    Microbiology: No results found for this or any previous visit (from the past 240 hour(s)).   Labs: Basic Metabolic Panel:  Recent Labs Lab 08/12/15 1434 08/13/15 0530  NA 137 138  K 3.7 4.5  CL 102 106  CO2 26 27  GLUCOSE 97 133*  BUN 12 13  CREATININE 0.82 0.55  CALCIUM 9.0 8.5*   Liver Function Tests: No results for input(s): AST, ALT, ALKPHOS, BILITOT, PROT, ALBUMIN in the last 168 hours. No results for input(s): LIPASE, AMYLASE in the last 168 hours. No results for input(s): AMMONIA in the last 168 hours. CBC:  Recent Labs Lab 08/12/15 1434 08/13/15 0530 08/14/15 0453  WBC 11.1* 12.9* 19.6*  NEUTROABS 4.5  --   --   HGB 14.7 13.7 12.8  HCT 45.4 42.1 40.0  MCV 95.2 95.2 96.6  PLT 402* 365 380   Cardiac Enzymes: No results for input(s): CKTOTAL, CKMB, CKMBINDEX,  TROPONINI in the last 168 hours. BNP: BNP (last 3 results) No results for input(s): BNP in the last 8760 hours.  ProBNP (last 3 results) No results for input(s): PROBNP in the last 8760 hours.  CBG:  Recent Labs Lab 08/14/15 1143 08/14/15 1609 08/14/15 2233 08/15/15 0742 08/15/15 1157  GLUCAP 108* 107* 183* 110* 86       Signed:  Darroll Bredeson MD.  [facility] 08/15/2015, 12:41 PM

## 2015-12-15 ENCOUNTER — Other Ambulatory Visit (HOSPITAL_COMMUNITY): Payer: Self-pay | Admitting: Respiratory Therapy

## 2015-12-15 DIAGNOSIS — J441 Chronic obstructive pulmonary disease with (acute) exacerbation: Secondary | ICD-10-CM

## 2015-12-21 ENCOUNTER — Ambulatory Visit (HOSPITAL_COMMUNITY)
Admission: RE | Admit: 2015-12-21 | Discharge: 2015-12-21 | Disposition: A | Discharge status: Home / Self Care | Payer: 59 | Source: Ambulatory Visit | Attending: Pulmonary Disease | Admitting: Pulmonary Disease

## 2015-12-21 DIAGNOSIS — J441 Chronic obstructive pulmonary disease with (acute) exacerbation: Secondary | ICD-10-CM | POA: Diagnosis present

## 2015-12-21 LAB — PULMONARY FUNCTION TEST
DL/VA % PRED: 96 %
DL/VA: 4.24 ml/min/mmHg/L
DLCO UNC: 9.5 ml/min/mmHg
DLCO unc % pred: 46 %
FEF 25-75 POST: 0.87 L/s
FEF 25-75 Pre: 0.57 L/sec
FEF2575-%Change-Post: 52 %
FEF2575-%Pred-Post: 47 %
FEF2575-%Pred-Pre: 30 %
FEV1-%CHANGE-POST: 12 %
FEV1-%PRED-POST: 57 %
FEV1-%PRED-PRE: 50 %
FEV1-POST: 1.04 L
FEV1-PRE: 0.93 L
FEV1FVC-%Change-Post: 0 %
FEV1FVC-%PRED-PRE: 87 %
FEV6-%Change-Post: 12 %
FEV6-%PRED-POST: 67 %
FEV6-%PRED-PRE: 60 %
FEV6-POST: 1.51 L
FEV6-Pre: 1.35 L
FEV6FVC-%CHANGE-POST: 0 %
FEV6FVC-%PRED-POST: 104 %
FEV6FVC-%Pred-Pre: 104 %
FVC-%Change-Post: 12 %
FVC-%Pred-Post: 65 %
FVC-%Pred-Pre: 58 %
FVC-Post: 1.51 L
FVC-Pre: 1.35 L
POST FEV6/FVC RATIO: 100 %
PRE FEV1/FVC RATIO: 69 %
PRE FEV6/FVC RATIO: 100 %
Post FEV1/FVC ratio: 69 %
RV % pred: 142 %
RV: 2.63 L
TLC % PRED: 92 %
TLC: 4.24 L

## 2015-12-21 MED ORDER — ALBUTEROL SULFATE (2.5 MG/3ML) 0.083% IN NEBU
2.5000 mg | INHALATION_SOLUTION | Freq: Once | RESPIRATORY_TRACT | Status: AC
  Administered 2015-12-21: 2.5 mg via RESPIRATORY_TRACT

## 2016-03-08 ENCOUNTER — Ambulatory Visit (INDEPENDENT_AMBULATORY_CARE_PROVIDER_SITE_OTHER): Payer: 59 | Admitting: Otolaryngology

## 2016-05-31 ENCOUNTER — Ambulatory Visit (INDEPENDENT_AMBULATORY_CARE_PROVIDER_SITE_OTHER): Payer: Managed Care, Other (non HMO) | Admitting: Otolaryngology

## 2016-05-31 DIAGNOSIS — R430 Anosmia: Secondary | ICD-10-CM | POA: Diagnosis not present

## 2016-05-31 DIAGNOSIS — J310 Chronic rhinitis: Secondary | ICD-10-CM

## 2016-05-31 DIAGNOSIS — J343 Hypertrophy of nasal turbinates: Secondary | ICD-10-CM

## 2016-05-31 DIAGNOSIS — J342 Deviated nasal septum: Secondary | ICD-10-CM | POA: Diagnosis not present

## 2016-07-05 ENCOUNTER — Ambulatory Visit (INDEPENDENT_AMBULATORY_CARE_PROVIDER_SITE_OTHER): Payer: Managed Care, Other (non HMO) | Admitting: Otolaryngology

## 2016-07-05 DIAGNOSIS — R430 Anosmia: Secondary | ICD-10-CM | POA: Diagnosis not present

## 2016-07-05 DIAGNOSIS — F17210 Nicotine dependence, cigarettes, uncomplicated: Secondary | ICD-10-CM | POA: Diagnosis not present

## 2016-07-05 DIAGNOSIS — J310 Chronic rhinitis: Secondary | ICD-10-CM

## 2016-07-05 DIAGNOSIS — J342 Deviated nasal septum: Secondary | ICD-10-CM | POA: Diagnosis not present

## 2016-07-05 DIAGNOSIS — J343 Hypertrophy of nasal turbinates: Secondary | ICD-10-CM

## 2016-08-14 ENCOUNTER — Emergency Department (HOSPITAL_COMMUNITY): Payer: Managed Care, Other (non HMO)

## 2016-08-14 ENCOUNTER — Emergency Department (HOSPITAL_COMMUNITY)
Admission: EM | Admit: 2016-08-14 | Discharge: 2016-08-14 | Disposition: A | Discharge status: Home / Self Care | Payer: Managed Care, Other (non HMO) | Attending: Emergency Medicine | Admitting: Emergency Medicine

## 2016-08-14 ENCOUNTER — Encounter (HOSPITAL_COMMUNITY): Payer: Self-pay | Admitting: Emergency Medicine

## 2016-08-14 DIAGNOSIS — R05 Cough: Secondary | ICD-10-CM

## 2016-08-14 DIAGNOSIS — Z7951 Long term (current) use of inhaled steroids: Secondary | ICD-10-CM | POA: Insufficient documentation

## 2016-08-14 DIAGNOSIS — Z7982 Long term (current) use of aspirin: Secondary | ICD-10-CM | POA: Insufficient documentation

## 2016-08-14 DIAGNOSIS — F17210 Nicotine dependence, cigarettes, uncomplicated: Secondary | ICD-10-CM | POA: Diagnosis not present

## 2016-08-14 DIAGNOSIS — J45909 Unspecified asthma, uncomplicated: Secondary | ICD-10-CM | POA: Insufficient documentation

## 2016-08-14 DIAGNOSIS — J441 Chronic obstructive pulmonary disease with (acute) exacerbation: Secondary | ICD-10-CM | POA: Diagnosis not present

## 2016-08-14 DIAGNOSIS — R059 Cough, unspecified: Secondary | ICD-10-CM

## 2016-08-14 MED ORDER — ALBUTEROL SULFATE HFA 108 (90 BASE) MCG/ACT IN AERS
2.0000 | INHALATION_SPRAY | Freq: Once | RESPIRATORY_TRACT | Status: AC
  Administered 2016-08-14: 2 via RESPIRATORY_TRACT
  Filled 2016-08-14: qty 6.7

## 2016-08-14 MED ORDER — BENZONATATE 100 MG PO CAPS
100.0000 mg | ORAL_CAPSULE | Freq: Once | ORAL | Status: AC
  Administered 2016-08-14: 100 mg via ORAL
  Filled 2016-08-14: qty 1

## 2016-08-14 MED ORDER — PREDNISONE 20 MG PO TABS: ORAL_TABLET | ORAL | 0 refills | Status: DC

## 2016-08-14 MED ORDER — PREDNISONE 10 MG PO TABS
60.0000 mg | ORAL_TABLET | Freq: Once | ORAL | Status: AC
  Administered 2016-08-14: 60 mg via ORAL
  Filled 2016-08-14: qty 1

## 2016-08-14 MED ORDER — AZITHROMYCIN 250 MG PO TABS
500.0000 mg | ORAL_TABLET | Freq: Once | ORAL | Status: AC
  Administered 2016-08-14: 500 mg via ORAL
  Filled 2016-08-14: qty 2

## 2016-08-14 MED ORDER — FLUTICASONE PROPIONATE 50 MCG/ACT NA SUSP
2.0000 | Freq: Every day | NASAL | Status: DC
  Administered 2016-08-14: 2 via NASAL
  Filled 2016-08-14: qty 16

## 2016-08-14 MED ORDER — BENZONATATE 100 MG PO CAPS: 100.0000 mg | ORAL_CAPSULE | Freq: Three times a day (TID) | ORAL | 0 refills | Status: DC | PRN

## 2016-08-14 MED ORDER — IPRATROPIUM-ALBUTEROL 0.5-2.5 (3) MG/3ML IN SOLN
3.0000 mL | RESPIRATORY_TRACT | Status: AC
  Administered 2016-08-14 (×2): 3 mL via RESPIRATORY_TRACT
  Filled 2016-08-14: qty 6

## 2016-08-14 MED ORDER — FLUTICASONE PROPIONATE 50 MCG/ACT NA SUSP: 2.0000 | Freq: Every day | NASAL | 0 refills | Status: DC

## 2016-08-14 MED ORDER — AZITHROMYCIN 250 MG PO TABS: 250.0000 mg | ORAL_TABLET | Freq: Every day | ORAL | 0 refills | Status: DC

## 2016-08-14 MED ORDER — ALBUTEROL SULFATE HFA 108 (90 BASE) MCG/ACT IN AERS: 2.0000 | INHALATION_SPRAY | RESPIRATORY_TRACT | 0 refills | Status: DC | PRN

## 2016-08-14 NOTE — ED Provider Notes (Signed)
[facility]-EMERGENCY DEPT Provider Note   CSN: [id number] Arrival date & time: 08/14/16  7829     History   Chief Complaint Chief Complaint  Patient presents with  . Cough    HPI Cassandra Montoya is a 61 y.o. female.   Cough  This is a recurrent problem. The current episode started 2 days ago. The problem occurs constantly. The problem has been gradually worsening. The cough is productive of sputum. There has been no fever. Associated symptoms include chest pain (with coughing), rhinorrhea, shortness of breath and wheezing. Pertinent negatives include no sweats and no ear congestion. Treatments tried: albuterol. The treatment provided no relief. She is a smoker. Her past medical history is significant for COPD.    Past Medical History:  Diagnosis Date  . Asthma   . COPD (chronic obstructive pulmonary disease) (HCC)   . Ovarian cyst    Right ovarian cyst,   . Pneumonia     Patient Active Problem List   Diagnosis Date Noted  . Hyperglycemia, drug-induced 08/13/2015  . COPD with acute exacerbation (HCC) 08/12/2015  . Acute respiratory distress 08/12/2015  . Tobacco abuse 05/25/2014    History reviewed. No pertinent surgical history.  OB History    No data available       Home Medications    Prior to Admission medications   Medication Sig Start Date End Date Taking? Authorizing Provider  albuterol (PROVENTIL HFA;VENTOLIN HFA) 108 (90 BASE) MCG/ACT inhaler Inhale 2 puffs into the lungs every 6 (six) hours as needed for wheezing or shortness of breath.   Yes [provider]  aspirin EC 81 MG tablet Take 81 mg by mouth daily.   Yes [provider]  BREO ELLIPTA 200-25 MCG/INH AEPB Inhale 1 puff into the lungs daily. 05/12/16  Yes [provider]  Fluticasone-Salmeterol (ADVAIR) 500-50 MCG/DOSE AEPB Inhale 1 puff into the lungs 2 (two) times daily. 08/15/15  Yes Elliot Cousin, MD  INCRUSE ELLIPTA 62.5 MCG/INH AEPB Inhale 1 puff into the lungs daily.  05/12/16  Yes [provider]  albuterol (PROVENTIL HFA;VENTOLIN HFA) 108 (90 Base) MCG/ACT inhaler Inhale 2-4 puffs into the lungs every 4 (four) hours as needed for wheezing or shortness of breath. 08/14/16   Keats Kingry, Barbara Cower, MD  azithromycin (ZITHROMAX) 250 MG tablet Take 1 tablet (250 mg total) by mouth daily. Take 1 every day until finished. 08/14/16   Omid Deardorff, Barbara Cower, MD  benzonatate (TESSALON) 100 MG capsule Take 1 capsule (100 mg total) by mouth 3 (three) times daily as needed for cough. 08/14/16   Mako Pelfrey, Barbara Cower, MD  fluticasone (FLONASE) 50 MCG/ACT nasal spray Place 2 sprays into both nostrils daily. 08/14/16   Loyd Salvador, Barbara Cower, MD  levofloxacin (LEVAQUIN) 750 MG tablet Take 1 tablet (750 mg total) by mouth daily. Antibiotic. Starting 08/16/2015, take 1 tablet daily for 3 more days. 08/15/15   Elliot Cousin, MD  predniSONE (DELTASONE) 20 MG tablet 2 tabs po daily x 4 days 08/14/16   Haifa Hatton, Barbara Cower, MD  tiotropium (SPIRIVA) 18 MCG inhalation capsule Place 1 capsule (18 mcg total) into inhaler and inhale daily. 08/15/15   Elliot Cousin, MD    Family History Family History  Problem Relation Age of Onset  . Stroke Brother   . Hypertension Mother     Social History Social History  Substance Use Topics  . Smoking status: Current Every Day Smoker    Packs/day: 0.50    Types: Cigarettes  . Smokeless tobacco: Never Used  . Alcohol use  No     Allergies   Patient has no known allergies.   Review of Systems Review of Systems  HENT: Positive for rhinorrhea.   Respiratory: Positive for cough, shortness of breath and wheezing.   Cardiovascular: Positive for chest pain (with coughing).  All other systems reviewed and are negative.    Physical Exam Updated Vital Signs BP 96/61   Pulse 74   Temp 97.5 F (36.4 C) (Oral)   Resp (!) 21   Ht 5\' 1"  (1.549 m)   Wt 63.5 kg (140 lb)   SpO2 98%   BMI 26.45 kg/m   Physical Exam  Constitutional: She is oriented to person, place, and  time. She appears well-developed and well-nourished.  HENT:  Head: Normocephalic and atraumatic.  Eyes: Conjunctivae and EOM are normal.  Neck: Normal range of motion.  Cardiovascular: Normal rate and regular rhythm.   Pulmonary/Chest: No stridor. No respiratory distress.  Abdominal: Soft. She exhibits no distension.  Neurological: She is alert and oriented to person, place, and time. No cranial nerve deficit. Coordination normal.  Skin: Skin is warm and dry.  Nursing note and vitals reviewed.    ED Treatments / Results  Labs (all labs ordered are listed, but only abnormal results are displayed) Labs Reviewed - No data to display  EKG  EKG Interpretation  Date/Time:  Tuesday August 14 2016 07:43:24 EDT Ventricular Rate:  81 PR Interval:    QRS Duration: 108 QT Interval:  345 QTC Calculation: 401 R Axis:   44 Text Interpretation:  Sinus rhythm Probable anteroseptal infarct, old No old tracing to compare Confirmed by Marily Memos [phone]) on 08/14/2016 8:27:46 AM       Radiology Dg Chest 2 View  Result Date: 08/14/2016 CLINICAL DATA:  Two days of cough. History of asthma-COPD. Current smoker. EXAM: CHEST  2 VIEW COMPARISON:  PA and lateral chest x-ray of August 12, 2015 FINDINGS: The lungs are mildly hyperinflated with hemidiaphragm flattening. The heart and pulmonary vascularity are normal. The mediastinum is normal in width. There is no pleural effusion. There is multilevel degenerative disc disease of the thoracic spine. There is calcification in the wall of the aortic arch. IMPRESSION: Mild hyperinflation consistent with the history of reactive airway disease/COPD. No pneumonia, CHF, nor other acute cardiopulmonary abnormality. Thoracic aortic atherosclerosis. Electronically Signed   By: David  [place] M.D.   On: 08/14/2016 07:38    Procedures Procedures (including critical care time)  Medications Ordered in ED Medications  ipratropium-albuterol (DUONEB) 0.5-2.5 (3) MG/3ML  nebulizer solution 3 mL (3 mLs Nebulization Given 08/14/16 0815)  fluticasone (FLONASE) 50 MCG/ACT nasal spray 2 spray (2 sprays Each Nare Given 08/14/16 0853)  albuterol (PROVENTIL HFA;VENTOLIN HFA) 108 (90 Base) MCG/ACT inhaler 2 puff (not administered)  predniSONE (DELTASONE) tablet 60 mg (60 mg Oral Given 08/14/16 0721)  benzonatate (TESSALON) capsule 100 mg (100 mg Oral Given 08/14/16 0721)  azithromycin (ZITHROMAX) tablet 500 mg (500 mg Oral Given 08/14/16 0720)     Initial Impression / Assessment and Plan / ED Course  I have reviewed the triage vital signs and the nursing notes.  Pertinent labs & imaging results that were available during my care of the patient were reviewed by me and considered in my medical decision making (see chart for details).    Likely URI causing copd exacerbation but has increased productive sputum so will treat with abx. Also will give tessalon/steroids/duonebs here to see if it can help with symptoms.   On reevaluation, significant  improvement in breathing, coughing and overall rhinnorhea. Feels much improved. ECG/CXR not concerning for acute processes, stable for dc on continued abx/supportive care/PCP follow up.   Final Clinical Impressions(s) / ED Diagnoses   Final diagnoses:  Cough  COPD exacerbation (HCC)    New Prescriptions New Prescriptions   ALBUTEROL (PROVENTIL HFA;VENTOLIN HFA) 108 (90 BASE) MCG/ACT INHALER    Inhale 2-4 puffs into the lungs every 4 (four) hours as needed for wheezing or shortness of breath.   AZITHROMYCIN (ZITHROMAX) 250 MG TABLET    Take 1 tablet (250 mg total) by mouth daily. Take 1 every day until finished.   BENZONATATE (TESSALON) 100 MG CAPSULE    Take 1 capsule (100 mg total) by mouth 3 (three) times daily as needed for cough.   FLUTICASONE (FLONASE) 50 MCG/ACT NASAL SPRAY    Place 2 sprays into both nostrils daily.   PREDNISONE (DELTASONE) 20 MG TABLET    2 tabs po daily x 4 days     Marily MemosMesner, Khylan Sawyer, MD 08/14/16  [phone]

## 2016-08-14 NOTE — ED Triage Notes (Signed)
Pt reports cough, congestion, runny nose, and wheezing since last night.  Does not use nebs at home.

## 2016-09-03 ENCOUNTER — Emergency Department (HOSPITAL_COMMUNITY)
Admission: EM | Admit: 2016-09-03 | Discharge: 2016-09-03 | Disposition: A | Discharge status: Home / Self Care | Payer: Managed Care, Other (non HMO) | Attending: Emergency Medicine | Admitting: Emergency Medicine

## 2016-09-03 ENCOUNTER — Encounter (HOSPITAL_COMMUNITY): Payer: Self-pay | Admitting: *Deleted

## 2016-09-03 ENCOUNTER — Emergency Department (HOSPITAL_COMMUNITY): Payer: Managed Care, Other (non HMO)

## 2016-09-03 DIAGNOSIS — F17210 Nicotine dependence, cigarettes, uncomplicated: Secondary | ICD-10-CM | POA: Diagnosis not present

## 2016-09-03 DIAGNOSIS — R0602 Shortness of breath: Secondary | ICD-10-CM | POA: Insufficient documentation

## 2016-09-03 DIAGNOSIS — Z720 Tobacco use: Secondary | ICD-10-CM

## 2016-09-03 DIAGNOSIS — J45909 Unspecified asthma, uncomplicated: Secondary | ICD-10-CM | POA: Insufficient documentation

## 2016-09-03 DIAGNOSIS — R079 Chest pain, unspecified: Secondary | ICD-10-CM | POA: Diagnosis present

## 2016-09-03 DIAGNOSIS — J441 Chronic obstructive pulmonary disease with (acute) exacerbation: Secondary | ICD-10-CM | POA: Insufficient documentation

## 2016-09-03 DIAGNOSIS — Z7982 Long term (current) use of aspirin: Secondary | ICD-10-CM | POA: Diagnosis not present

## 2016-09-03 LAB — BASIC METABOLIC PANEL
ANION GAP: 9 (ref 5–15)
BUN: 6 mg/dL (ref 6–20)
CALCIUM: 8.5 mg/dL — AB (ref 8.9–10.3)
CO2: 25 mmol/L (ref 22–32)
CREATININE: 0.76 mg/dL (ref 0.44–1.00)
Chloride: 109 mmol/L (ref 101–111)
GFR calc Af Amer: >60 mL/min (ref >60)
Glucose, Bld: 89 mg/dL (ref 65–99)
Potassium: 3.5 mmol/L (ref 3.5–5.1)
Sodium: 143 mmol/L (ref 135–145)

## 2016-09-03 LAB — CBC
HCT: 41.4 % (ref 36.0–46.0)
HEMOGLOBIN: 13.5 g/dL (ref 12.0–15.0)
MCH: 31.6 pg (ref 26.0–34.0)
MCHC: 32.6 g/dL (ref 30.0–36.0)
MCV: 97 fL (ref 78.0–100.0)
PLATELETS: 364 10*3/uL (ref 150–400)
RBC: 4.27 MIL/uL (ref 3.87–5.11)
RDW: 14.6 % (ref 11.5–15.5)
WBC: 5.3 10*3/uL (ref 4.0–10.5)

## 2016-09-03 LAB — I-STAT TROPONIN, ED: TROPONIN I, POC: 0.02 ng/mL (ref 0.00–0.08)

## 2016-09-03 LAB — BRAIN NATRIURETIC PEPTIDE: B Natriuretic Peptide: 38 pg/mL (ref 0.0–100.0)

## 2016-09-03 MED ORDER — ALBUTEROL (5 MG/ML) CONTINUOUS INHALATION SOLN
10.0000 mg/h | INHALATION_SOLUTION | Freq: Once | RESPIRATORY_TRACT | Status: AC
  Administered 2016-09-03: 10 mg/h via RESPIRATORY_TRACT
  Filled 2016-09-03: qty 20

## 2016-09-03 MED ORDER — PREDNISONE 10 MG PO TABS: ORAL_TABLET | ORAL | 0 refills | Status: DC

## 2016-09-03 MED ORDER — PREDNISONE 50 MG PO TABS
60.0000 mg | ORAL_TABLET | Freq: Once | ORAL | Status: AC
  Administered 2016-09-03: 60 mg via ORAL
  Filled 2016-09-03: qty 1

## 2016-09-03 NOTE — ED Provider Notes (Signed)
[facility]-EMERGENCY DEPT Provider Note   CSN: [id number] Arrival date & time: 09/03/16  1546     History   Chief Complaint Chief Complaint  Patient presents with  . Chest Pain    HPI Cassandra Montoya is a [age] y.o. female.   She presents for evaluation of chest discomfort, intermittent for several days.  The discomfort seems to be caused by movement, deep breathing, and is associated with coughing, productive of yellow to clear sputum.  She was recently seen and treated for cough with antibiotics, cough suppressant tablet, nasal steroid and beta agonists.  She also uses long-term medication for COPD.  She denies fever, chills, nausea or vomiting.  She has chronic ongoing anorexia, secondary to difficulty tasting food.  She continues to smoke cigarettes.  There are no other known modifying factors.   HPI  Past Medical History:  Diagnosis Date  . Asthma   . COPD (chronic obstructive pulmonary disease) (HCC)   . Ovarian cyst    Right ovarian cyst,   . Pneumonia     Patient Active Problem List   Diagnosis Date Noted  . Hyperglycemia, drug-induced 08/13/2015  . COPD with acute exacerbation (HCC) 08/12/2015  . Acute respiratory distress 08/12/2015  . Tobacco abuse 05/25/2014    History reviewed. No pertinent surgical history.  OB History    No data available       Home Medications    Prior to Admission medications   Medication Sig Start Date End Date Taking? Authorizing Provider  albuterol (PROVENTIL HFA;VENTOLIN HFA) 108 (90 Base) MCG/ACT inhaler Inhale 2-4 puffs into the lungs every 4 (four) hours as needed for wheezing or shortness of breath. 08/14/16  Yes Mesner, Barbara CowerJason, MD  aspirin EC 81 MG tablet Take 81 mg by mouth daily.   Yes [provider]  BREO ELLIPTA 200-25 MCG/INH AEPB Inhale 1 puff into the lungs daily. 05/12/16  Yes [provider]  fluticasone (FLONASE) 50 MCG/ACT nasal spray Place 2 sprays into both nostrils daily. 08/14/16  Yes Mesner, Barbara CowerJason,  MD  INCRUSE ELLIPTA 62.5 MCG/INH AEPB Inhale 1 puff into the lungs daily. 05/12/16  Yes [provider]  predniSONE (DELTASONE) 10 MG tablet Take q day 6,5,4,3,2,1 09/03/16   Mancel BaleWentz, Maygen Sirico, MD    Family History Family History  Problem Relation Age of Onset  . Stroke Brother   . Hypertension Mother     Social History Social History  Substance Use Topics  . Smoking status: Current Every Day Smoker    Packs/day: 0.50    Types: Cigarettes  . Smokeless tobacco: Never Used  . Alcohol use No     Allergies   Patient has no known allergies.   Review of Systems Review of Systems  All other systems reviewed and are negative.    Physical Exam Updated Vital Signs BP 131/79   Pulse 87   Temp 97.9 F (36.6 C) (Oral)   Resp (!) 22   Ht 5\' 1"  (1.549 m)   Wt 59 kg (130 lb)   SpO2 96%   BMI 24.56 kg/m   Physical Exam  Constitutional: She is oriented to person, place, and time. She appears well-developed and well-nourished.  HENT:  Head: Normocephalic and atraumatic.  Eyes: Pupils are equal, round, and reactive to light. Conjunctivae and EOM are normal.  Neck: Normal range of motion and phonation normal. Neck supple.  Cardiovascular: Normal rate and regular rhythm.   Pulmonary/Chest: Effort normal. She exhibits no tenderness.  Decreased air movement bilaterally  with scattered rhonchi and wheezes.  No increased work of breathing.  Abdominal: Soft. She exhibits no distension. There is no tenderness. There is no guarding.  Musculoskeletal: Normal range of motion. She exhibits no edema, tenderness or deformity.  Neurological: She is alert and oriented to person, place, and time. She exhibits normal muscle tone.  Skin: Skin is warm and dry.  Psychiatric: She has a normal mood and affect. Her behavior is normal. Judgment and thought content normal.  Nursing note and vitals reviewed.    ED Treatments / Results  Labs (all labs ordered are listed, but only abnormal  results are displayed) Labs Reviewed  BASIC METABOLIC PANEL - Abnormal; Notable for the following:       Result Value   Calcium 8.5 (*)    All other components within normal limits  CBC  BRAIN NATRIURETIC PEPTIDE  I-STAT TROPOININ, ED    EKG  EKG Interpretation  Date/Time:  Monday September 03 2016 15:51:55 EDT Ventricular Rate:  74 PR Interval:    QRS Duration: 79 QT Interval:  366 QTC Calculation: 406 R Axis:   52 Text Interpretation:  Sinus rhythm since last tracing no significant change Confirmed by Mancel Bale [id number]) on 09/03/2016 3:57:30 PM       Radiology Dg Chest 2 View  Result Date: 09/03/2016 CLINICAL DATA:  Chest pain.  Shortness of breath . EXAM: CHEST  2 VIEW COMPARISON:  08/14/2016. FINDINGS: Mediastinum hilar structures normal. Lungs are clear. No pleural effusion or pneumothorax. Stable apical pleural-parenchymal thickening consistent with scarring. Heart size stable. No acute bony abnormality. Degenerative changes thoracic spine . IMPRESSION: No acute cardiopulmonary disease.  Stable chest. Electronically Signed   By: Maisie Fus  Register   On: 09/03/2016 16:42    Procedures Procedures (including critical care time)  Medications Ordered in ED Medications  albuterol (PROVENTIL,VENTOLIN) solution continuous neb (10 mg/hr Nebulization Given 09/03/16 1637)  predniSONE (DELTASONE) tablet 60 mg (60 mg Oral Given 09/03/16 1700)     Initial Impression / Assessment and Plan / ED Course  I have reviewed the triage vital signs and the nursing notes.  Pertinent labs & imaging results that were available during my care of the patient were reviewed by me and considered in my medical decision making (see chart for details).      Patient Vitals for the past 24 hrs:  BP Temp Temp src Pulse Resp SpO2 Height Weight  09/03/16 1900 131/79 - - 87 (!) 22 96 % - -  09/03/16 1830 137/68 - - 84 (!) 21 94 % - -  09/03/16 1800 (!) 147/76 - - 98 (!) 27 96 % - -  09/03/16 1730  139/67 - - 93 (!) 27 99 % - -  09/03/16 1637 - - - - - 97 % - -  09/03/16 1600 (!) 143/82 - - 68 (!) 26 99 % - -  09/03/16 1550 (!) 157/73 97.9 F (36.6 C) Oral 68 20 96 % - -  09/03/16 1547 - - - - - - 5\' 1"  (1.549 m) 59 kg (130 lb)    7:18 PM Reevaluation with update and discussion. After initial assessment and treatment, an updated evaluation reveals she is more comfortable now and feels ready to go home.  Lungs have somewhat improved air movement.  Findings discussed with the patient and all questions were answered. Shadonna Benedick L    Final Clinical Impressions(s) / ED Diagnoses   Final diagnoses:  COPD exacerbation (HCC)  Tobacco abuse  Evaluation consistent with tobacco related COPD exacerbation.  Doubt pneumonia, ACS, PE or impending vascular collapse.  Nursing Notes Reviewed/ Care Coordinated Applicable Imaging Reviewed Interpretation of Laboratory Data incorporated into ED treatment  The patient appears reasonably screened and/or stabilized for discharge and I doubt any other medical condition or other [facility] requiring further screening, evaluation, or treatment in the ED at this time prior to discharge.  Plan: Home Medications-continue current medications; Home Treatments-stop smoking; return here if the recommended treatment, does not improve the symptoms; Recommended follow up-PCP 1 week, for checkup   New Prescriptions New Prescriptions   PREDNISONE (DELTASONE) 10 MG TABLET    Take q day 6,5,4,3,2,1     Mancel Bale, MD 09/03/16 1919

## 2016-09-03 NOTE — ED Notes (Signed)
Pt states understanding of care given and follow up instructions.  Pt a/o ambulated from ED slowly but with steady gait

## 2016-09-03 NOTE — Discharge Instructions (Signed)
It is important to stop smoking.  Take the prednisone as directed, starting tomorrow morning.  Use your albuterol inhaler 2 puffs every 3 or 4 hours as needed for cough or trouble breathing.  To help with the cough you can try Robitussin-DM every 4 hours.

## 2016-09-03 NOTE — ED Triage Notes (Signed)
Pt comes in by EMS for a cough for 2 weeks with chest pain starting Thursday with crackles to lower lobes. NAD noted.

## 2016-10-11 ENCOUNTER — Ambulatory Visit (INDEPENDENT_AMBULATORY_CARE_PROVIDER_SITE_OTHER): Payer: Managed Care, Other (non HMO) | Admitting: Otolaryngology

## 2016-12-17 ENCOUNTER — Emergency Department (HOSPITAL_COMMUNITY)
Admission: EM | Admit: 2016-12-17 | Discharge: 2016-12-17 | Disposition: A | Discharge status: Home / Self Care | Payer: 59 | Attending: Emergency Medicine | Admitting: Emergency Medicine

## 2016-12-17 ENCOUNTER — Encounter (HOSPITAL_COMMUNITY): Payer: Self-pay | Admitting: *Deleted

## 2016-12-17 DIAGNOSIS — J45909 Unspecified asthma, uncomplicated: Secondary | ICD-10-CM | POA: Diagnosis not present

## 2016-12-17 DIAGNOSIS — J449 Chronic obstructive pulmonary disease, unspecified: Secondary | ICD-10-CM | POA: Insufficient documentation

## 2016-12-17 DIAGNOSIS — M79601 Pain in right arm: Secondary | ICD-10-CM | POA: Diagnosis present

## 2016-12-17 DIAGNOSIS — F17210 Nicotine dependence, cigarettes, uncomplicated: Secondary | ICD-10-CM | POA: Insufficient documentation

## 2016-12-17 DIAGNOSIS — Z7982 Long term (current) use of aspirin: Secondary | ICD-10-CM | POA: Insufficient documentation

## 2016-12-17 DIAGNOSIS — G5601 Carpal tunnel syndrome, right upper limb: Secondary | ICD-10-CM | POA: Diagnosis not present

## 2016-12-17 DIAGNOSIS — Z79899 Other long term (current) drug therapy: Secondary | ICD-10-CM | POA: Insufficient documentation

## 2016-12-17 MED ORDER — IBUPROFEN 400 MG PO TABS
400.0000 mg | ORAL_TABLET | Freq: Once | ORAL | Status: AC
  Administered 2016-12-17: 400 mg via ORAL
  Filled 2016-12-17: qty 1

## 2016-12-17 MED ORDER — IBUPROFEN 400 MG PO TABS: 400.0000 mg | ORAL_TABLET | Freq: Four times a day (QID) | ORAL | 0 refills | Status: DC | PRN

## 2016-12-17 NOTE — ED Provider Notes (Signed)
[facility] EMERGENCY DEPARTMENT Provider Note   CSN: [id number] Arrival date & time: 12/17/16  0350     History   Chief Complaint Chief Complaint  Patient presents with  . Arm Pain    HPI Cassandra Montoya is a 61 y.o. female.  HPI  This is a 61 year old female with a history of asthma, COPD who presents with right arm pain.  Patient reports many months of progressive right arm pain.  She states that it started in her wrist and now seems to migrate to her elbow and shoulder.  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  She does note some tingling in her third and fourth digits.  She takes [facility] powder with minimal relief.  Currently she rates her pain at 10 out of 10.  She states "it just got unbearable."  She denies any specific injury.  She denies any chest complaints including chest pain or shortness of breath.  Past Medical History:  Diagnosis Date  . Asthma   . COPD (chronic obstructive pulmonary disease) (HCC)   . Ovarian cyst    Right ovarian cyst,   . Pneumonia     Patient Active Problem List   Diagnosis Date Noted  . Hyperglycemia, drug-induced 08/13/2015  . COPD with acute exacerbation (HCC) 08/12/2015  . Acute respiratory distress 08/12/2015  . Tobacco abuse 05/25/2014    History reviewed. No pertinent surgical history.  OB History    No data available       Home Medications    Prior to Admission medications   Medication Sig Start Date End Date Taking? Authorizing Provider  albuterol (PROVENTIL HFA;VENTOLIN HFA) 108 (90 Base) MCG/ACT inhaler Inhale 2-4 puffs into the lungs every 4 (four) hours as needed for wheezing or shortness of breath. 08/14/16   Mesner, Barbara Cower, MD  aspirin EC 81 MG tablet Take 81 mg by mouth daily.    [provider]  BREO ELLIPTA 200-25 MCG/INH AEPB Inhale 1 puff into the lungs daily. 05/12/16   [provider]  fluticasone (FLONASE) 50 MCG/ACT nasal spray Place 2 sprays  into both nostrils daily. 08/14/16   Mesner, Barbara Cower, MD  ibuprofen (ADVIL,MOTRIN) 400 MG tablet Take 1 tablet (400 mg total) by mouth every 6 (six) hours as needed. 12/17/16   Alaynna Kerwood, Mayer Masker, MD  INCRUSE ELLIPTA 62.5 MCG/INH AEPB Inhale 1 puff into the lungs daily. 05/12/16   [provider]    Family History Family History  Problem Relation Age of Onset  . Stroke Brother   . Hypertension Mother     Social History Social History  Substance Use Topics  . Smoking status: Current Every Day Smoker    Packs/day: 0.50    Types: Cigarettes  . Smokeless tobacco: Never Used  . Alcohol use No     Allergies   Patient has no known allergies.   Review of Systems Review of Systems  Constitutional: Negative for fever.  Musculoskeletal:       Right arm pain  Skin: Negative for wound.  Neurological: Positive for numbness. Negative for weakness.  All other systems reviewed and are negative.    Physical Exam Updated Vital Signs BP (!) 160/77 (BP Location: Right Arm)   Pulse 63   Temp (!) 97.5 F (36.4 C) (Oral)   Resp 16   Ht 5\' 1"  (1.549 m)   Wt 64.4 kg (142 lb)   SpO2 100%   BMI 26.83 kg/m  Physical Exam  Constitutional: She is oriented to person, place, and time. She appears well-developed and well-nourished. No distress.  HENT:  Head: Normocephalic and atraumatic.  Cardiovascular: Normal rate and regular rhythm.   Pulmonary/Chest: Effort normal. No respiratory distress.  Musculoskeletal: She exhibits no edema.  no obvious swelling, tenderness to palpation over the carpal tunnel with radiation of pain into her fingers, otherwise neurovascularly intact, normal range of motion of the wrist, elbow, shoulder  Neurological: She is alert and oriented to person, place, and time.  5 out of 5 grip, biceps, triceps strength,   Skin: Skin is warm and dry.  Psychiatric: She has a normal mood and affect.  Nursing note and vitals reviewed.    ED Treatments / Results    Labs (all labs ordered are listed, but only abnormal results are displayed) Labs Reviewed - No data to display  EKG  EKG Interpretation None       Radiology No results found.  Procedures Procedures (including critical care time)  Medications Ordered in ED Medications  ibuprofen (ADVIL,MOTRIN) tablet 400 mg (not administered)     Initial Impression / Assessment and Plan / ED Course  I have reviewed the triage vital signs and the nursing notes.  Pertinent labs & imaging results that were available during my care of the patient were reviewed by me and considered in my medical decision making (see chart for details).     Patient presents with acute on chronic right wrist and arm pain.  Nontoxic on exam.  No obvious objective abnormalities on exam with the exception of tenderness over the carpal tunnel with reproduction of the numbness into her fingers.  Given her line of work, suspect carpal tunnel injury with compensatory injury to the proximal arm.  Will give patient a brace.  Recommend anti-inflammatories.  After history, exam, and medical workup I feel the patient has been appropriately medically screened and is safe for discharge home. Pertinent diagnoses were discussed with the patient. Patient was given return precautions.   Final Clinical Impressions(s) / ED Diagnoses   Final diagnoses:  Carpal tunnel syndrome of right wrist    New Prescriptions New Prescriptions   IBUPROFEN (ADVIL,MOTRIN) 400 MG TABLET    Take 1 tablet (400 mg total) by mouth every 6 (six) hours as needed.     Shon BatonHorton, Aylyn Wenzler F, MD 12/17/16 [phone]

## 2016-12-17 NOTE — Discharge Instructions (Signed)
You were seen today for right arm pain.  This may be related to carpal tunnel syndrome.  Use the brace as needed.  Take ibuprofen for pain.

## 2016-12-17 NOTE — ED Notes (Signed)
Pt alert & oriented x4, stable gait. Patient  given discharge instructions, paperwork & prescription(s). Patient verbalized understanding. Pt left department w/ no further questions. 

## 2016-12-17 NOTE — ED Triage Notes (Signed)
Pt c/o right arm pain x one month; pt denies any injury

## 2016-12-17 NOTE — ED Notes (Signed)
Pt states right arm pain for a long time. Pt denies any injury or new activity. Pt has good pulses & sensation. Described as a burning in her bones. Came to the ER tonight because she can't sleep.

## 2016-12-28 ENCOUNTER — Encounter (INDEPENDENT_AMBULATORY_CARE_PROVIDER_SITE_OTHER): Payer: Self-pay | Admitting: *Deleted

## 2016-12-28 ENCOUNTER — Other Ambulatory Visit (HOSPITAL_COMMUNITY): Payer: Self-pay | Admitting: Family

## 2016-12-28 DIAGNOSIS — R432 Parageusia: Secondary | ICD-10-CM

## 2016-12-28 DIAGNOSIS — R430 Anosmia: Secondary | ICD-10-CM

## 2017-01-04 ENCOUNTER — Other Ambulatory Visit: Payer: Self-pay

## 2017-01-04 ENCOUNTER — Ambulatory Visit (HOSPITAL_COMMUNITY)
Admission: RE | Admit: 2017-01-04 | Discharge: 2017-01-04 | Disposition: A | Discharge status: Home / Self Care | Payer: 59 | Source: Ambulatory Visit | Attending: Family | Admitting: Family

## 2017-01-04 ENCOUNTER — Encounter (HOSPITAL_COMMUNITY): Payer: Self-pay | Admitting: Emergency Medicine

## 2017-01-04 DIAGNOSIS — R430 Anosmia: Secondary | ICD-10-CM

## 2017-01-04 DIAGNOSIS — F17210 Nicotine dependence, cigarettes, uncomplicated: Secondary | ICD-10-CM | POA: Insufficient documentation

## 2017-01-04 DIAGNOSIS — J441 Chronic obstructive pulmonary disease with (acute) exacerbation: Secondary | ICD-10-CM | POA: Diagnosis not present

## 2017-01-04 DIAGNOSIS — Z79899 Other long term (current) drug therapy: Secondary | ICD-10-CM | POA: Insufficient documentation

## 2017-01-04 DIAGNOSIS — Z7982 Long term (current) use of aspirin: Secondary | ICD-10-CM | POA: Diagnosis not present

## 2017-01-04 DIAGNOSIS — M25531 Pain in right wrist: Secondary | ICD-10-CM | POA: Diagnosis not present

## 2017-01-04 DIAGNOSIS — R432 Parageusia: Secondary | ICD-10-CM

## 2017-01-04 NOTE — ED Triage Notes (Signed)
Pt c/o bilateral hand/wrist to mid forearm pain. Pt states she has history of carpal tunnel.

## 2017-01-05 ENCOUNTER — Emergency Department (HOSPITAL_COMMUNITY)
Admission: EM | Admit: 2017-01-05 | Discharge: 2017-01-05 | Disposition: A | Discharge status: Home / Self Care | Payer: 59 | Attending: Emergency Medicine | Admitting: Emergency Medicine

## 2017-01-05 ENCOUNTER — Emergency Department (HOSPITAL_COMMUNITY): Payer: 59

## 2017-01-05 DIAGNOSIS — M25531 Pain in right wrist: Secondary | ICD-10-CM

## 2017-01-05 MED ORDER — HYDROCODONE-ACETAMINOPHEN 5-325 MG PO TABS: 1.0000 | ORAL_TABLET | Freq: Four times a day (QID) | ORAL | 0 refills | Status: DC | PRN

## 2017-01-05 MED ORDER — HYDROCODONE-ACETAMINOPHEN 5-325 MG PO TABS
1.0000 | ORAL_TABLET | Freq: Once | ORAL | Status: AC
  Administered 2017-01-05: 1 via ORAL
  Filled 2017-01-05: qty 1

## 2017-01-05 MED ORDER — PREDNISONE 50 MG PO TABS: ORAL_TABLET | ORAL | 0 refills | Status: DC

## 2017-01-05 NOTE — ED Notes (Signed)
Patient transported to X-ray 

## 2017-01-05 NOTE — ED Notes (Signed)
Pt c/o bilateral hand, wrist and forearm pain that started "awhile ago" pt states that she has hx of carpal tunnel. Denies any recent injury.;

## 2017-01-05 NOTE — ED Notes (Signed)
ED Provider at bedside. 

## 2017-01-05 NOTE — ED Notes (Signed)
Pt updated on plan of care, denies any complaints,  

## 2017-01-05 NOTE — ED Provider Notes (Signed)
[facility] EMERGENCY DEPARTMENT Provider Note   CSN: [id number] Arrival date & time: 01/04/17  2256     History   Chief Complaint Chief Complaint  Patient presents with  . Wrist Pain    HPI Cassandra Montoya is a 61 y.o. female.  The history is provided by the patient.  Wrist Pain  This is a recurrent problem. The current episode started more than 2 days ago. The problem occurs daily. The problem has been gradually worsening. Pertinent negatives include no chest pain. The symptoms are aggravated by twisting. The symptoms are relieved by rest.   Patient reports worsening bilateral wrist pain, right >left No trauma She feels it is work related Recently told she may have carpal tunnel, she has been using wrist splint but no improvement No other acute issues No neck pain No CP No leg pain/weakness  Past Medical History:  Diagnosis Date  . Asthma   . COPD (chronic obstructive pulmonary disease) (HCC)   . Ovarian cyst    Right ovarian cyst,   . Pneumonia     Patient Active Problem List   Diagnosis Date Noted  . Hyperglycemia, drug-induced 08/13/2015  . COPD with acute exacerbation (HCC) 08/12/2015  . Acute respiratory distress 08/12/2015  . Tobacco abuse 05/25/2014    History reviewed. No pertinent surgical history.  OB History    No data available       Home Medications    Prior to Admission medications   Medication Sig Start Date End Date Taking? Authorizing Provider  albuterol (PROVENTIL HFA;VENTOLIN HFA) 108 (90 Base) MCG/ACT inhaler Inhale 2-4 puffs into the lungs every 4 (four) hours as needed for wheezing or shortness of breath. 08/14/16   Mesner, Barbara CowerJason, MD  aspirin EC 81 MG tablet Take 81 mg by mouth daily.    [provider]  BREO ELLIPTA 200-25 MCG/INH AEPB Inhale 1 puff into the lungs daily. 05/12/16   [provider]  fluticasone (FLONASE) 50 MCG/ACT nasal spray Place 2 sprays into both nostrils daily. 08/14/16   Mesner, Barbara CowerJason, MD    ibuprofen (ADVIL,MOTRIN) 400 MG tablet Take 1 tablet (400 mg total) by mouth every 6 (six) hours as needed. 12/17/16   Horton, Mayer Maskerourtney F, MD  INCRUSE ELLIPTA 62.5 MCG/INH AEPB Inhale 1 puff into the lungs daily. 05/12/16   [provider]    Family History Family History  Problem Relation Age of Onset  . Stroke Brother   . Hypertension Mother     Social History Social History   Tobacco Use  . Smoking status: Current Every Day Smoker    Packs/day: 0.50    Types: Cigarettes  . Smokeless tobacco: Never Used  Substance Use Topics  . Alcohol use: No  . Drug use: No     Allergies   Patient has no known allergies.   Review of Systems Review of Systems  Cardiovascular: Negative for chest pain.  Musculoskeletal: Positive for arthralgias.     Physical Exam Updated Vital Signs BP (!) 147/80   Pulse 68   Temp (!) 97.5 F (36.4 C)   Resp 18   Ht 1.549 m (5\' 1" )   Wt 62.1 kg (137 lb)   SpO2 100%   BMI 25.89 kg/m   Physical Exam CONSTITUTIONAL: Well developed/well nourished HEAD: Normocephalic/atraumatic ENMT: Mucous membranes moist NECK: supple no meningeal signs SPINE/BACK:entire spine nontender CV: S1/S2 noted, no murmurs/rubs/gallops noted LUNGS: Lungs are clear to auscultation bilaterally, no apparent distress ABDOMEN: soft, nontender NEURO: Pt is  awake/alert/appropriate, moves all extremitiesx4.  No facial droop.  Equal hand grips.  No arm drift.  Equal power with elbow flex/extension.   EXTREMITIES: pulses normal/equal, full ROM of both wrists, tenderness and mild edema to right wrist/hand on dorsal surface.  No tenderness along palm of right hand.  No erythema noted.  Minimal tenderness to left wrist and no edema noted  SKIN: warm, color normal PSYCH: no abnormalities of mood noted, alert and oriented to situation   ED Treatments / Results  Labs (all labs ordered are listed, but only abnormal results are displayed) Labs Reviewed - No data to  display  EKG  EKG Interpretation None       Radiology Dg Wrist Complete Right  Result Date: 01/05/2017 CLINICAL DATA:  Chronic right hand and wrist pain. EXAM: RIGHT WRIST - COMPLETE 3+ VIEW COMPARISON:  None. FINDINGS: There is no evidence of fracture or dislocation. The carpal rows are intact, and demonstrate normal alignment. The joint spaces are preserved. No significant soft tissue abnormalities are seen. IMPRESSION: No evidence of fracture or dislocation. Electronically Signed   By: Roanna RaiderJeffery  Chang M.D.   On: 01/05/2017 01:35   Dg Hand Complete Right  Result Date: 01/05/2017 CLINICAL DATA:  Chronic right hand pain. EXAM: RIGHT HAND - COMPLETE 3+ VIEW COMPARISON:  None. FINDINGS: There is no evidence of fracture or dislocation. Mild joint space narrowing and sclerosis are noted at the distal interphalangeal joints. The carpal rows are intact, and demonstrate normal alignment. The soft tissues are unremarkable in appearance. IMPRESSION: 1. No evidence of fracture or dislocation. 2. Minimal changes of osteoarthritis. Electronically Signed   By: Roanna RaiderJeffery  Chang M.D.   On: 01/05/2017 01:36    Procedures Procedures (including critical care time)  Medications Ordered in ED Medications  HYDROcodone-acetaminophen (NORCO/VICODIN) 5-325 MG per tablet 1 tablet (1 tablet Oral Given 01/05/17 0058)     Initial Impression / Assessment and Plan / ED Course  I have reviewed the triage vital signs and the nursing notes.  Pertinent  imaging results that were available during my care of the patient were reviewed by me and considered in my medical decision making (see chart for details). Narcotic database reviewed and considered in decision making     Pt with recent diagnosis of carpal tunnel syndrome which could be playing a role, but also seems to have findings of noninfectious arthritis Will start short course of pain meds and prednisone Advised need for outpatient management and referral to  ortho   Final Clinical Impressions(s) / ED Diagnoses   Final diagnoses:  Arthralgia of right wrist    ED Discharge Orders        Ordered    predniSONE (DELTASONE) 50 MG tablet     01/05/17 0206    HYDROcodone-acetaminophen (NORCO/VICODIN) 5-325 MG tablet  Every 6 hours PRN     01/05/17 0208       Zadie RhineWickline, Jaymian Bogart, MD 01/05/17 [phone]

## 2017-01-05 NOTE — ED Notes (Signed)
Pt waiting for ride home,  

## 2017-02-14 ENCOUNTER — Encounter (HOSPITAL_COMMUNITY): Payer: Self-pay | Admitting: *Deleted

## 2017-02-14 ENCOUNTER — Inpatient Hospital Stay (HOSPITAL_COMMUNITY)
Admission: EM | Admit: 2017-02-14 | Discharge: 2017-02-17 | DRG: 194 | Disposition: A | Discharge status: Home / Self Care | Payer: 59 | Attending: Internal Medicine | Admitting: Internal Medicine

## 2017-02-14 ENCOUNTER — Emergency Department (HOSPITAL_COMMUNITY): Payer: 59

## 2017-02-14 ENCOUNTER — Other Ambulatory Visit: Payer: Self-pay

## 2017-02-14 DIAGNOSIS — G894 Chronic pain syndrome: Secondary | ICD-10-CM | POA: Diagnosis present

## 2017-02-14 DIAGNOSIS — J441 Chronic obstructive pulmonary disease with (acute) exacerbation: Secondary | ICD-10-CM | POA: Diagnosis present

## 2017-02-14 DIAGNOSIS — R0603 Acute respiratory distress: Secondary | ICD-10-CM

## 2017-02-14 DIAGNOSIS — F17210 Nicotine dependence, cigarettes, uncomplicated: Secondary | ICD-10-CM | POA: Diagnosis present

## 2017-02-14 DIAGNOSIS — E876 Hypokalemia: Secondary | ICD-10-CM | POA: Diagnosis present

## 2017-02-14 DIAGNOSIS — R0902 Hypoxemia: Secondary | ICD-10-CM | POA: Diagnosis present

## 2017-02-14 DIAGNOSIS — Z8249 Family history of ischemic heart disease and other diseases of the circulatory system: Secondary | ICD-10-CM | POA: Diagnosis not present

## 2017-02-14 DIAGNOSIS — J101 Influenza due to other identified influenza virus with other respiratory manifestations: Principal | ICD-10-CM | POA: Diagnosis present

## 2017-02-14 DIAGNOSIS — Z7982 Long term (current) use of aspirin: Secondary | ICD-10-CM | POA: Diagnosis not present

## 2017-02-14 LAB — BASIC METABOLIC PANEL
ANION GAP: 10 (ref 5–15)
BUN: 12 mg/dL (ref 6–20)
CHLORIDE: 102 mmol/L (ref 101–111)
CO2: 26 mmol/L (ref 22–32)
CREATININE: 0.65 mg/dL (ref 0.44–1.00)
Calcium: 8.3 mg/dL — ABNORMAL LOW (ref 8.9–10.3)
GFR calc non Af Amer: >60 mL/min (ref >60)
Glucose, Bld: 109 mg/dL — ABNORMAL HIGH (ref 65–99)
POTASSIUM: 3.3 mmol/L — AB (ref 3.5–5.1)
SODIUM: 138 mmol/L (ref 135–145)

## 2017-02-14 LAB — CBC WITH DIFFERENTIAL/PLATELET
BASOS PCT: 0 %
Basophils Absolute: 0 10*3/uL (ref 0.0–0.1)
Eosinophils Absolute: 0.1 10*3/uL (ref 0.0–0.7)
Eosinophils Relative: 1 %
HEMATOCRIT: 41.5 % (ref 36.0–46.0)
Hemoglobin: 13.7 g/dL (ref 12.0–15.0)
Lymphocytes Relative: 12 %
Lymphs Abs: 1.2 10*3/uL (ref 0.7–4.0)
MCH: 32.1 pg (ref 26.0–34.0)
MCHC: 33 g/dL (ref 30.0–36.0)
MCV: 97.2 fL (ref 78.0–100.0)
MONOS PCT: 9 %
Monocytes Absolute: 0.9 10*3/uL (ref 0.1–1.0)
NEUTROS ABS: 7.3 10*3/uL (ref 1.7–7.7)
Neutrophils Relative %: 78 %
Platelets: 307 10*3/uL (ref 150–400)
RBC: 4.27 MIL/uL (ref 3.87–5.11)
RDW: 13.9 % (ref 11.5–15.5)
WBC: 9.5 10*3/uL (ref 4.0–10.5)

## 2017-02-14 LAB — INFLUENZA PANEL BY PCR (TYPE A & B)
INFLAPCR: POSITIVE — AB
Influenza B By PCR: NEGATIVE

## 2017-02-14 MED ORDER — POTASSIUM CHLORIDE CRYS ER 20 MEQ PO TBCR
20.0000 meq | EXTENDED_RELEASE_TABLET | Freq: Once | ORAL | Status: AC
  Administered 2017-02-15: 20 meq via ORAL
  Filled 2017-02-14: qty 1

## 2017-02-14 MED ORDER — ASPIRIN EC 81 MG PO TBEC
81.0000 mg | DELAYED_RELEASE_TABLET | Freq: Every day | ORAL | Status: DC
Start: 2017-02-15 — End: 2017-02-17
  Administered 2017-02-15 – 2017-02-17 (×3): 81 mg via ORAL
  Filled 2017-02-14 (×3): qty 1

## 2017-02-14 MED ORDER — LORAZEPAM 0.5 MG PO TABS
0.5000 mg | ORAL_TABLET | Freq: Four times a day (QID) | ORAL | Status: DC | PRN
  Administered 2017-02-15 (×3): 0.5 mg via ORAL
  Filled 2017-02-14 (×3): qty 1

## 2017-02-14 MED ORDER — BENZONATATE 100 MG PO CAPS
200.0000 mg | ORAL_CAPSULE | Freq: Once | ORAL | Status: AC
  Administered 2017-02-14: 200 mg via ORAL
  Filled 2017-02-14: qty 2

## 2017-02-14 MED ORDER — ONDANSETRON HCL 4 MG/2ML IJ SOLN: 4.0000 mg | Freq: Four times a day (QID) | INTRAMUSCULAR | Status: DC | PRN

## 2017-02-14 MED ORDER — ACETAMINOPHEN 650 MG RE SUPP: 650.0000 mg | Freq: Four times a day (QID) | RECTAL | Status: DC | PRN

## 2017-02-14 MED ORDER — ALBUTEROL SULFATE (2.5 MG/3ML) 0.083% IN NEBU
2.5000 mg | INHALATION_SOLUTION | RESPIRATORY_TRACT | Status: DC | PRN
  Administered 2017-02-15 – 2017-02-16 (×3): 2.5 mg via RESPIRATORY_TRACT
  Filled 2017-02-14 (×2): qty 3

## 2017-02-14 MED ORDER — ONDANSETRON HCL 4 MG PO TABS: 4.0000 mg | ORAL_TABLET | Freq: Four times a day (QID) | ORAL | Status: DC | PRN

## 2017-02-14 MED ORDER — ALBUTEROL (5 MG/ML) CONTINUOUS INHALATION SOLN
10.0000 mg/h | INHALATION_SOLUTION | RESPIRATORY_TRACT | Status: AC
  Administered 2017-02-14: 10 mg/h via RESPIRATORY_TRACT
  Filled 2017-02-14: qty 20

## 2017-02-14 MED ORDER — BISACODYL 5 MG PO TBEC: 5.0000 mg | DELAYED_RELEASE_TABLET | Freq: Every day | ORAL | Status: DC | PRN

## 2017-02-14 MED ORDER — ENOXAPARIN SODIUM 40 MG/0.4ML ~~LOC~~ SOLN
40.0000 mg | SUBCUTANEOUS | Status: DC
Start: 2017-02-14 — End: 2017-02-17
  Administered 2017-02-15 – 2017-02-16 (×3): 40 mg via SUBCUTANEOUS
  Filled 2017-02-14 (×3): qty 0.4

## 2017-02-14 MED ORDER — UMECLIDINIUM BROMIDE 62.5 MCG/INH IN AEPB
1.0000 | INHALATION_SPRAY | Freq: Every day | RESPIRATORY_TRACT | Status: DC
  Administered 2017-02-15 – 2017-02-17 (×3): 1 via RESPIRATORY_TRACT
  Filled 2017-02-14: qty 7

## 2017-02-14 MED ORDER — METHYLPREDNISOLONE SODIUM SUCC 40 MG IJ SOLR
40.0000 mg | Freq: Two times a day (BID) | INTRAMUSCULAR | Status: DC
  Administered 2017-02-15 – 2017-02-17 (×6): 40 mg via INTRAVENOUS
  Filled 2017-02-14 (×6): qty 1

## 2017-02-14 MED ORDER — OSELTAMIVIR PHOSPHATE 75 MG PO CAPS
75.0000 mg | ORAL_CAPSULE | Freq: Once | ORAL | Status: AC
  Administered 2017-02-14: 75 mg via ORAL
  Filled 2017-02-14: qty 1

## 2017-02-14 MED ORDER — ACETAMINOPHEN 325 MG PO TABS
650.0000 mg | ORAL_TABLET | Freq: Four times a day (QID) | ORAL | Status: DC | PRN
  Administered 2017-02-15 – 2017-02-16 (×3): 650 mg via ORAL
  Filled 2017-02-14 (×3): qty 2

## 2017-02-14 MED ORDER — ALBUTEROL SULFATE (2.5 MG/3ML) 0.083% IN NEBU
5.0000 mg | INHALATION_SOLUTION | Freq: Once | RESPIRATORY_TRACT | Status: AC
  Administered 2017-02-14: 5 mg via RESPIRATORY_TRACT
  Filled 2017-02-14: qty 6

## 2017-02-14 MED ORDER — OSELTAMIVIR PHOSPHATE 75 MG PO CAPS
75.0000 mg | ORAL_CAPSULE | Freq: Two times a day (BID) | ORAL | Status: DC
  Administered 2017-02-15 – 2017-02-17 (×5): 75 mg via ORAL
  Filled 2017-02-14 (×5): qty 1

## 2017-02-14 MED ORDER — IOPAMIDOL (ISOVUE-370) INJECTION 76%: 100.0000 mL | Freq: Once | INTRAVENOUS | Status: DC | PRN

## 2017-02-14 MED ORDER — FLUTICASONE FUROATE-VILANTEROL 200-25 MCG/INH IN AEPB
1.0000 | INHALATION_SPRAY | Freq: Every day | RESPIRATORY_TRACT | Status: DC
  Administered 2017-02-15 – 2017-02-17 (×3): 1 via RESPIRATORY_TRACT
  Filled 2017-02-14: qty 28

## 2017-02-14 MED ORDER — HYDROCODONE-ACETAMINOPHEN 5-325 MG PO TABS
1.0000 | ORAL_TABLET | Freq: Four times a day (QID) | ORAL | Status: DC | PRN
  Administered 2017-02-14: 1 via ORAL
  Filled 2017-02-14: qty 1

## 2017-02-14 MED ORDER — SENNOSIDES-DOCUSATE SODIUM 8.6-50 MG PO TABS: 1.0000 | ORAL_TABLET | Freq: Every evening | ORAL | Status: DC | PRN

## 2017-02-14 MED ORDER — POTASSIUM CHLORIDE IN NACL 20-0.9 MEQ/L-% IV SOLN
INTRAVENOUS | Status: AC
  Administered 2017-02-15: 01:00:00 via INTRAVENOUS

## 2017-02-14 MED ORDER — BENZONATATE 100 MG PO CAPS
200.0000 mg | ORAL_CAPSULE | Freq: Three times a day (TID) | ORAL | Status: DC | PRN
  Administered 2017-02-15 – 2017-02-17 (×3): 200 mg via ORAL
  Filled 2017-02-14 (×3): qty 2

## 2017-02-14 NOTE — ED Notes (Signed)
Pt c/o feeling like she is going to "pass out"; went in to reassess pt, pt sitting on side of bed in nad; VSS at this time, family at bedside

## 2017-02-14 NOTE — H&P (Signed)
History and Physical    Cassandra Montoya WGN:[id number] DOB: 07/03/1955 DOA: 02/14/2017  [facility]   Patient coming from: Home  Chief Complaint: SOB, cough, fever  HPI: Cassandra Montoya is a 61 y.o. female with medical history significant for COPD and chronic pain, now presenting to the emergency department for evaluation of shortness of breath, fever, and cough.  Patient reports that she was in her usual state until last night when she had the insidious development of fevers, chills, shortness of breath, cough, and rhinorrhea.  Symptoms worsened over the course of the day to the point where she was having difficulty catching her breath at rest.  Denies sick contacts.  Did not receive her flu shot this year.  Reports some right-sided chest pain with cough.  ED Course: Upon arrival to the ED, patient is found to be afebrile here, requiring supplemental oxygen in order to maintain saturations in the 90s, tachypneic as high as 40, and with stable blood pressure.  EKG features a sinus tachycardia with rate 106.  Chest x-ray is negative for acute cardiopulmonary disease.  Chemistry panel reveals a potassium of 3.3 and CBC is unremarkable but I am told that influenza A is positive, though this has not resulted in her chart yet.  CTA chest had been ordered, but the patient refused it.  She was given multiple neb treatments in the ED, Tamiflu, and Tessalon.  She remains hemodynamically stable, continues to be in respiratory distress at rest, and will be admitted for ongoing evaluation and management of respiratory distress suspected secondary to influenza A.   Review of Systems:  All other systems reviewed and apart from HPI, are negative.  Past Medical History:  Diagnosis Date  . Asthma   . COPD (chronic obstructive pulmonary disease) (HCC)   . Ovarian cyst    Right ovarian cyst,   . Pneumonia     History reviewed. No pertinent surgical history.   reports that she  has been smoking cigarettes.  She has been smoking about 0.50 packs per day. she has never used smokeless tobacco. She reports that she does not drink alcohol or use drugs.  No Known Allergies  Family History  Problem Relation Age of Onset  . Stroke Brother   . Hypertension Mother      Prior to Admission medications   Medication Sig Start Date End Date Taking? Authorizing Provider  aspirin EC 81 MG tablet Take 81 mg by mouth daily.   Yes [provider]  BREO ELLIPTA 200-25 MCG/INH AEPB Inhale 1 puff into the lungs daily. 05/12/16  Yes [provider]  HYDROcodone-acetaminophen (NORCO/VICODIN) 5-325 MG tablet Take 1 tablet every 6 (six) hours as needed by mouth. 01/05/17  Yes Zadie RhineWickline, Donald, MD  ibuprofen (ADVIL,MOTRIN) 400 MG tablet Take 1 tablet (400 mg total) by mouth every 6 (six) hours as needed. 12/17/16  Yes Horton, Mayer Maskerourtney F, MD  INCRUSE ELLIPTA 62.5 MCG/INH AEPB Inhale 1 puff into the lungs daily. 05/12/16  Yes [provider]    Physical Exam: Vitals:   02/14/17 2030 02/14/17 2100 02/14/17 2140 02/14/17 2211  BP: 140/72 (!) 142/88 (!) 172/68   Pulse: 76  84   Resp: (!) 35 (!) 24 (!) 40   Temp:      TempSrc:      SpO2: (!) 86%  97% 97%  Weight:      Height:          Constitutional: tachypneic, agitated,  no pallor or diaphoresis  Eyes: PERTLA, lids and conjunctivae normal ENMT: Mucous membranes are moist. Posterior pharynx clear of any exudate or lesions.   Neck: normal, supple, no masses, no thyromegaly Respiratory: Diminished bilaterally with expiratory wheezes, no crackles. Tachypnea. Speaking full sentences. No accessory muscle use.  Cardiovascular: S1 & S2 heard, regular rate and rhythm, no significant murmurs / rubs / gallops. No significant JVD. Abdomen: No distension, no tenderness, no masses palpated. Bowel sounds normal.  Musculoskeletal: no clubbing / cyanosis. No joint deformity upper and lower extremities.    Skin: no  significant rashes, lesions, ulcers. Warm, dry, well-perfused. Neurologic: CN 2-12 grossly intact. Sensation intact. Strength 5/5 in all 4 limbs.  Psychiatric: Alert and oriented x 3. Irascible, agitated.      Labs on Admission: I have personally reviewed following labs and imaging studies  CBC: Recent Labs  Lab 02/14/17 2014  WBC 9.5  NEUTROABS 7.3  HGB 13.7  HCT 41.5  MCV 97.2  PLT 307   Basic Metabolic Panel: Recent Labs  Lab 02/14/17 2014  NA 138  K 3.3*  CL 102  CO2 26  GLUCOSE 109*  BUN 12  CREATININE 0.65  CALCIUM 8.3*   GFR: Estimated Creatinine Clearance: 55.7 mL/min (by C-G formula based on SCr of 0.65 mg/dL). Liver Function Tests: No results for input(s): AST, ALT, ALKPHOS, BILITOT, PROT, ALBUMIN in the last 168 hours. No results for input(s): LIPASE, AMYLASE in the last 168 hours. No results for input(s): AMMONIA in the last 168 hours. Coagulation Profile: No results for input(s): INR, PROTIME in the last 168 hours. Cardiac Enzymes: No results for input(s): CKTOTAL, CKMB, CKMBINDEX, TROPONINI in the last 168 hours. BNP (last 3 results) No results for input(s): PROBNP in the last 8760 hours. HbA1C: No results for input(s): HGBA1C in the last 72 hours. CBG: No results for input(s): GLUCAP in the last 168 hours. Lipid Profile: No results for input(s): CHOL, HDL, LDLCALC, TRIG, CHOLHDL, LDLDIRECT in the last 72 hours. Thyroid Function Tests: No results for input(s): TSH, T4TOTAL, FREET4, T3FREE, THYROIDAB in the last 72 hours. Anemia Panel: No results for input(s): VITAMINB12, FOLATE, FERRITIN, TIBC, IRON, RETICCTPCT in the last 72 hours. Urine analysis: No results found for: COLORURINE, APPEARANCEUR, LABSPEC, PHURINE, GLUCOSEU, HGBUR, BILIRUBINUR, KETONESUR, PROTEINUR, UROBILINOGEN, NITRITE, LEUKOCYTESUR Sepsis Labs: @LABRCNTIP (procalcitonin:4,lacticidven:4) )No results found for this or any previous visit (from the past 240 hour(s)).    Radiological Exams on Admission: Dg Chest 2 View  Result Date: 02/14/2017 CLINICAL DATA:  [age] year old female with cough and fever. EXAM: CHEST  2 VIEW COMPARISON:  Chest radiograph dated 09/03/2016 FINDINGS: The lungs are clear. There is no pleural effusion or pneumothorax. The cardiac silhouette is within normal limits. No acute osseous pathology. IMPRESSION: No active cardiopulmonary disease. Electronically Signed   By: Elgie CollardArash  Radparvar M.D.   On: 02/14/2017 19:52    EKG: Independently reviewed. Sinus tachycardia (rate 106).   Assessment/Plan  1. Influenza A  - Presents with rhinorrhea, fever, cough, SOB - Requiring supplemental O2 in ED - Influenza A was reportedly positive in ED though result not appearing in EMR - Treated with Tamiflu in ED will continue 75 mg BID  - Continue supportive care with nebs, supplemental O2, analgesia    2. COPD with acute exacerbation  - Wheezing and hypoxia on admission, likely precipitated by #1  - Treated with continuous albuterol treatments in ED and still wheezing  - Continue Breo and Incruse Ellipta, continue albuterol nebs, start systemic steroid with  Solu-Medrol, continue prn supplemental O2   3. Chronic pain  - Stable, continue home regimen with Norco prn    4. Hypokalemia  - Serum potassium is 3.3 on admission  - Treated with 20 mEq oral potassium  - Repeat chem panel in am     DVT prophylaxis: Lovenox  Code Status: Full  Family Communication: Mother updated at bedside Disposition Plan: Admit to med-surg Consults called: None Admission status: Inpatient    Briscoe Deutscher, MD [facility] Pager [phone]  If 7PM-7AM, please contact night-coverage [email or web address] Password [facility]  02/14/2017, 10:22 PM

## 2017-02-14 NOTE — ED Notes (Signed)
Pt gone over to CT 

## 2017-02-14 NOTE — ED Notes (Addendum)
Patient assisted to restroom via wheelchair, due to patient not being able to continue to walk due to being SOB. Assisted patient back to room to bed. Patient very weak and wheezing and coughing. Patient moaning in pain at this time. States that she is ready to go home. Family states to patient " your are in no shape to go home." Patient's 02 sat upon returning to room 92 percent, respirations 40.

## 2017-02-14 NOTE — ED Provider Notes (Addendum)
[facility]GENCY [facility]PART[facility] Provider Note   CSN: [id number] Arrival date & time: 02/14/17  1820     History   Chief Complaint Chief Complaint  Patient presents with  . Cough    HPI Cassandra Montoya is a 61 y.o. female.  HPI  The patient is a 61 year old female, she has a known history of COPD, reactive airway disease and states that she started having coughing yesterday, this is progressed through the last day and a half and has since developed some fever subjectively as well as a feeling of shortness of breath, persistent productive cough and now has a runny nose.  She does have some fatigue and body aches.  Her chest pain is on the right side and gets worse with coughing and deep breathing.  She reports that she has more coughing and shortness of breath when she tries to lay down.  She denies any known sick contacts, she did not get a flu shot this year.  Past Medical History:  Diagnosis Date  . Asthma   . COPD (chronic obstructive pulmonary disease) (HCC)   . Ovarian cyst    Right ovarian cyst,   . Pneumonia     Patient Active Problem List   Diagnosis Date Noted  . Hyperglycemia, drug-induced 08/13/2015  . COPD with acute exacerbation (HCC) 08/12/2015  . Acute respiratory distress 08/12/2015  . Tobacco abuse 05/25/2014    History reviewed. No pertinent surgical history.  OB History    No data available       Home Medications    Prior to Admission medications   Medication Sig Start Date End Date Taking? Authorizing Provider  aspirin EC 81 MG tablet Take 81 mg by mouth daily.   Yes [provider]  BREO ELLIPTA 200-25 MCG/INH AEPB Inhale 1 puff into the lungs daily. 05/12/16  Yes [provider]  HYDROcodone-acetaminophen (NORCO/VICODIN) 5-325 MG tablet Take 1 tablet every 6 (six) hours as needed by mouth. 01/05/17  Yes Zadie RhineWickline, Donald, MD  ibuprofen (ADVIL,MOTRIN) 400 MG tablet Take 1 tablet (400 mg total) by mouth every 6 (six) hours as  needed. 12/17/16  Yes Horton, Mayer Maskerourtney F, MD  INCRUSE ELLIPTA 62.5 MCG/INH AEPB Inhale 1 puff into the lungs daily. 05/12/16  Yes [provider]  predniSONE (DELTASONE) 50 MG tablet One tablet PO daily for 5 days Patient not taking: Reported on 02/14/2017 01/05/17   Zadie RhineWickline, Donald, MD    Family History Family History  Problem Relation Age of Onset  . Stroke Brother   . Hypertension Mother     Social History Social History   Tobacco Use  . Smoking status: Current Every Day Smoker    Packs/day: 0.50    Types: Cigarettes  . Smokeless tobacco: Never Used  Substance Use Topics  . Alcohol use: No  . Drug use: No     Allergies   Patient has no known allergies.   Review of Systems Review of Systems  All other systems reviewed and are negative.    Physical Exam Updated Vital Signs BP (!) 172/68 (BP Location: Right Arm)   Pulse 84   Temp 99 F (37.2 C) (Oral)   Resp (!) 40   Ht 4\' 9"  (1.448 m)   Wt 61.7 kg (136 lb)   SpO2 97%   BMI 29.43 kg/m   Physical Exam  Constitutional: She appears well-developed and well-nourished. No distress.  HENT:  Head: Normocephalic and atraumatic.  Mouth/Throat: Oropharynx is clear and moist. No oropharyngeal  exudate.  Copious amounts of clear rhinorrhea bilaterally, oropharynx is clear and moist  Eyes: Conjunctivae and EOM are normal. Pupils are equal, round, and reactive to light. Right eye exhibits no discharge. Left eye exhibits no discharge. No scleral icterus.  Neck: Normal range of motion. Neck supple. No JVD present. No thyromegaly present.  Cardiovascular: Normal rate, regular rhythm, normal heart sounds and intact distal pulses. Exam reveals no gallop and no friction rub.  No murmur heard. Pulmonary/Chest: Breath sounds normal. No respiratory distress. She has no wheezes. She has no rales.  Lung sounds overall are normal, she is minimally tachypneic  Abdominal: Soft. Bowel sounds are normal. She exhibits no  distension and no mass. There is no tenderness.  Musculoskeletal: Normal range of motion. She exhibits edema ( Minimal bilateral lower extremity edema, symmetrical, scant). She exhibits no tenderness.  Lymphadenopathy:    She has no cervical adenopathy.  Neurological: She is alert. Coordination normal.  Skin: Skin is warm and dry. No rash noted. No erythema.  Psychiatric: She has a normal mood and affect. Her behavior is normal.  Nursing note and vitals reviewed.    ED Treatments / Results  Labs (all labs ordered are listed, but only abnormal results are displayed) Labs Reviewed  BASIC METABOLIC PANEL - Abnormal; Notable for the following components:      Result Value   Potassium 3.3 (*)    Glucose, Bld 109 (*)    Calcium 8.3 (*)    All other components within normal limits  CBC WITH DIFFERENTIAL/PLATELET  INFLUENZA PANEL BY PCR (TYPE A & B)    EKG  EKG Interpretation  Date/Time:  Thursday February 14 2017 18:33:15 EST Ventricular Rate:  106 PR Interval:    QRS Duration: 99 QT Interval:  324 QTC Calculation: 431 R Axis:   68 Text Interpretation:  Sinus tachycardia Low voltage, precordial leads Baseline wander in lead(s) III aVF V4 Since last tracing rate faster Confirmed by Eber HongMiller, Enriqueta Augusta ([id number]) on 02/14/2017 6:51:18 PM       Radiology Dg Chest 2 View  Result Date: 02/14/2017 CLINICAL DATA:  61 year old female with cough and fever. EXAM: CHEST  2 VIEW COMPARISON:  Chest radiograph dated 09/03/2016 FINDINGS: The lungs are clear. There is no pleural effusion or pneumothorax. The cardiac silhouette is within normal limits. No acute osseous pathology. IMPRESSION: No active cardiopulmonary disease. Electronically Signed   By: Elgie CollardArash  Radparvar M.D.   On: 02/14/2017 19:52    Procedures Procedures (including critical care time)  Medications Ordered in ED Medications  iopamidol (ISOVUE-370) 76 % injection 100 mL (not administered)  albuterol (PROVENTIL,VENTOLIN) solution  continuous neb (not administered)  benzonatate (TESSALON) capsule 200 mg (200 mg Oral Given 02/14/17 1915)  albuterol (PROVENTIL) (2.5 MG/3ML) 0.083% nebulizer solution 5 mg (5 mg Nebulization Given 02/14/17 1908)  oseltamivir (TAMIFLU) capsule 75 mg (75 mg Oral Given 02/14/17 2204)     Initial Impression / Assessment and Plan / ED Course  I have reviewed the triage vital signs and the nursing notes.  Pertinent labs & imaging results that were available during my care of the patient were reviewed by me and considered in my medical decision making (see chart for details).  Clinical Course as of Feb 15 2204  Thu Feb 14, 2017  2104 The patient was reexamined after the nebulizer treatment, she is still tight, wheezing, not moving much air and coughing frequently.  Oxygenation ranges between 92 and 98%.  When she is sleeping on her side she  drops into the low 80% range.  Will pursue further evaluation with a CT angiogram as labs thus far have been nondiagnostic and the chest x-ray showed no signs of infiltrate or pneumothorax  [BM]  2202 The pt just ambulated and dropped to 82% with increased visual dyspnea and work of breathing - better with rest and back on nebs.  Paged hospitalist at 10 PM  [BM]    Clinical Course User Index [BM] Eber Hong, MD    Overall the EKG is unremarkable, the patient is not tachycardic, she is not hypoxic, she is 95% on room air.  She has copious clear rhinorrhea with coughing and fever, I suspect this is likely viral and flu related however will obtain an x-ray secondary to the chest pain.  Influenza test result came back positive for influenza A, the patient refused CT scan which at this time I think is reasonable since she is flu positive.  She continues to wheeze, she continues to desaturate, if she is at risk for fluid related complications and will be admitted to the hospital.  She has been given Tamiflu, multiple nebulized treatments, no signs of  pneumonia  D/w Dr. Antionette Char who will admit  Final Clinical Impressions(s) / ED Diagnoses   Final diagnoses:  Respiratory distress  Influenza A    ED Discharge Orders    None       Eber Hong, MD 02/14/17 2158    Eber Hong, MD 02/14/17 2205

## 2017-02-14 NOTE — ED Notes (Signed)
Pt gone over to xray  

## 2017-02-14 NOTE — ED Triage Notes (Signed)
[facility] with c/o clear, frothy productive cough that started last night and right sided sharp chest pain that occurs with coughing. Temp of 101.4 for EMS. Hx of COPD. O2 sat 93-94% on RA for EMS.

## 2017-02-15 ENCOUNTER — Encounter (HOSPITAL_COMMUNITY): Payer: Self-pay

## 2017-02-15 DIAGNOSIS — J101 Influenza due to other identified influenza virus with other respiratory manifestations: Principal | ICD-10-CM

## 2017-02-15 DIAGNOSIS — J441 Chronic obstructive pulmonary disease with (acute) exacerbation: Secondary | ICD-10-CM

## 2017-02-15 DIAGNOSIS — R0603 Acute respiratory distress: Secondary | ICD-10-CM

## 2017-02-15 DIAGNOSIS — E876 Hypokalemia: Secondary | ICD-10-CM

## 2017-02-15 LAB — BASIC METABOLIC PANEL
ANION GAP: 7 (ref 5–15)
BUN: 10 mg/dL (ref 6–20)
CALCIUM: 8.2 mg/dL — AB (ref 8.9–10.3)
CO2: 28 mmol/L (ref 22–32)
CREATININE: 0.48 mg/dL (ref 0.44–1.00)
Chloride: 102 mmol/L (ref 101–111)
GLUCOSE: 120 mg/dL — AB (ref 65–99)
Potassium: 3.7 mmol/L (ref 3.5–5.1)
Sodium: 137 mmol/L (ref 135–145)

## 2017-02-15 LAB — CBC WITH DIFFERENTIAL/PLATELET
BASOS ABS: 0 10*3/uL (ref 0.0–0.1)
BASOS PCT: 0 %
EOS ABS: 0 10*3/uL (ref 0.0–0.7)
Eosinophils Relative: 0 %
HEMATOCRIT: 41.9 % (ref 36.0–46.0)
Hemoglobin: 13.4 g/dL (ref 12.0–15.0)
Lymphocytes Relative: 2 %
Lymphs Abs: 0.2 10*3/uL — ABNORMAL LOW (ref 0.7–4.0)
MCH: 31.7 pg (ref 26.0–34.0)
MCHC: 32 g/dL (ref 30.0–36.0)
MCV: 99.1 fL (ref 78.0–100.0)
MONO ABS: 0.2 10*3/uL (ref 0.1–1.0)
MONOS PCT: 2 %
NEUTROS ABS: 10.7 10*3/uL — AB (ref 1.7–7.7)
NEUTROS PCT: 96 %
Platelets: 354 10*3/uL (ref 150–400)
RBC: 4.23 MIL/uL (ref 3.87–5.11)
RDW: 14.2 % (ref 11.5–15.5)
WBC: 11.1 10*3/uL — ABNORMAL HIGH (ref 4.0–10.5)

## 2017-02-15 MED ORDER — ENSURE ENLIVE PO LIQD
237.0000 mL | Freq: Two times a day (BID) | ORAL | Status: DC
  Administered 2017-02-15 – 2017-02-17 (×5): 237 mL via ORAL

## 2017-02-15 MED ORDER — GUAIFENESIN-DM 100-10 MG/5ML PO SYRP: 5.0000 mL | ORAL_SOLUTION | ORAL | Status: DC | PRN

## 2017-02-15 MED ORDER — ALBUTEROL SULFATE (2.5 MG/3ML) 0.083% IN NEBU
2.5000 mg | INHALATION_SOLUTION | RESPIRATORY_TRACT | Status: DC
  Administered 2017-02-15: 2.5 mg via RESPIRATORY_TRACT
  Filled 2017-02-15 (×2): qty 3

## 2017-02-15 MED ORDER — GUAIFENESIN-DM 100-10 MG/5ML PO SYRP
10.0000 mL | ORAL_SOLUTION | ORAL | Status: DC | PRN
  Administered 2017-02-15 – 2017-02-17 (×7): 10 mL via ORAL
  Filled 2017-02-15 (×8): qty 10

## 2017-02-15 NOTE — ED Notes (Signed)
Report given to Allison RN on 300 

## 2017-02-15 NOTE — Progress Notes (Signed)
PROGRESS NOTE    Cassandra Montoya  WUJ:[id number] DOB: 10/23/1955 DOA: 02/14/2017 [facility]    Brief NarrativeMary L Lodema Montoya is a 61 y.o. female with medical history significant for COPD and chronic pain, now presenting to the emergency department for evaluation of shortness of breath, fever, and cough.  Patient reports that she was in her usual state until last night when she had the insidious development of fevers, chills, shortness of breath, cough, and rhinorrhea.  Symptoms worsened over the course of the day to the point where she was having difficulty catching her breath at rest.  Denies sick contacts.  Did not receive her flu shot this year.  Reports some right-sided chest pain with cough.    Assessment & Plan:   Principal Problem:   Influenza A Active Problems:   COPD with acute exacerbation (HCC)   Hypokalemia  Influenza A On Tamiflu, continue with supplemental oxygen and supportive care with nebs.  ACute COPD exacerbation secondary to influenza A Continue with duo nebs, nasal cannula oxygen as needed IV Solu-Medrol.    Chronic pain syndrome resume home medications   Hypokalemia Replaced recheck in the morning.        DVT prophylaxis: Lovenox Code Status: Full code Family Communication:familya t bedside.  Disposition Plan:home when stable.    Consultants:   None.    Procedures: none.    Antimicrobials:   Subjective: Feels awful.   Objective: Vitals:   02/15/17 1249 02/15/17 1252 02/15/17 1257 02/15/17 1500  BP:    129/75  Pulse:    91  Resp:    20  Temp:    98.9 F (37.2 C)  TempSrc:    Oral  SpO2: 97% 97% 97% 96%  Weight:      Height:        Intake/Output Summary (Last 24 hours) at 02/15/2017 1855 Last data filed at 02/15/2017 1500 Gross per 24 hour  Intake 1128.33 ml  Output 2150 ml  Net -1021.67 ml   Filed Weights   02/14/17 1827 02/15/17 0142  Weight: 61.7 kg (136 lb) 60.6 kg (133 lb 9.6 oz)     Examination:  General exam: Appears calm and comfortable  Respiratory system: bilateral wheezing heard.  Cardiovascular system: S1 & S2 heard, RRR. No JVD, murmurs, rubs, gallops or clicks. No pedal edema. Gastrointestinal system: Abdomen is nondistended, soft and nontender. No organomegaly or masses felt. Normal bowel sounds heard. Central nervous system: Alert and oriented. No focal neurological deficits. Extremities: Symmetric 5 x 5 power. Skin: No rashes, lesions or ulcers Psychiatry: Judgement and insight appear normal. Mood & affect appropriate.     Data Reviewed: I have personally reviewed following labs and imaging studies  CBC: Recent Labs  Lab 02/14/17 2014 02/15/17 0546  WBC 9.5 11.1*  NEUTROABS 7.3 10.7*  HGB 13.7 13.4  HCT 41.5 41.9  MCV 97.2 99.1  PLT 307 354   Basic Metabolic Panel: Recent Labs  Lab 02/14/17 2014 02/15/17 0546  NA 138 137  K 3.3* 3.7  CL 102 102  CO2 26 28  GLUCOSE 109* 120*  BUN 12 10  CREATININE 0.65 0.48  CALCIUM 8.3* 8.2*   GFR: Estimated Creatinine Clearance: 61.7 mL/min (by C-G formula based on SCr of 0.48 mg/dL). Liver Function Tests: No results for input(s): AST, ALT, ALKPHOS, BILITOT, PROT, ALBUMIN in the last 168 hours. No results for input(s): LIPASE, AMYLASE in the last 168 hours. No results for input(s): AMMONIA in the  last 168 hours. Coagulation Profile: No results for input(s): INR, PROTIME in the last 168 hours. Cardiac Enzymes: No results for input(s): CKTOTAL, CKMB, CKMBINDEX, TROPONINI in the last 168 hours. BNP (last 3 results) No results for input(s): PROBNP in the last 8760 hours. HbA1C: No results for input(s): HGBA1C in the last 72 hours. CBG: No results for input(s): GLUCAP in the last 168 hours. Lipid Profile: No results for input(s): CHOL, HDL, LDLCALC, TRIG, CHOLHDL, LDLDIRECT in the last 72 hours. Thyroid Function Tests: No results for input(s): TSH, T4TOTAL, FREET4, T3FREE, THYROIDAB in the  last 72 hours. Anemia Panel: No results for input(s): VITAMINB12, FOLATE, FERRITIN, TIBC, IRON, RETICCTPCT in the last 72 hours. Sepsis Labs: No results for input(s): PROCALCITON, LATICACIDVEN in the last 168 hours.  No results found for this or any previous visit (from the past 240 hour(s)).       Radiology Studies: Dg Chest 2 View  Result Date: 02/14/2017 CLINICAL DATA:  61 year old female with cough and fever. EXAM: CHEST  2 VIEW COMPARISON:  Chest radiograph dated 09/03/2016 FINDINGS: The lungs are clear. There is no pleural effusion or pneumothorax. The cardiac silhouette is within normal limits. No acute osseous pathology. IMPRESSION: No active cardiopulmonary disease. Electronically Signed   By: Elgie CollardArash  Radparvar M.D.   On: 02/14/2017 19:52        Scheduled Meds: . albuterol  2.5 mg Nebulization Q4H  . aspirin EC  81 mg Oral Daily  . enoxaparin (LOVENOX) injection  40 mg Subcutaneous Q24H  . feeding supplement (ENSURE ENLIVE)  237 mL Oral BID BM  . fluticasone furoate-vilanterol  1 puff Inhalation Daily  . methylPREDNISolone (SOLU-MEDROL) injection  40 mg Intravenous Q12H  . oseltamivir  75 mg Oral BID  . umeclidinium bromide  1 puff Inhalation Daily   Continuous Infusions:   LOS: 1 day    Time spent: 35 minutes.     Kathlen ModyVijaya Jeorge Reister, MD [facility] Pager [phone]   If 7PM-7AM, please contact night-coverage [email or web address] Password [email or web address] 02/15/2017, 6:55 PM

## 2017-02-16 LAB — BASIC METABOLIC PANEL
Anion gap: 11 (ref 5–15)
BUN: 10 mg/dL (ref 6–20)
CHLORIDE: 101 mmol/L (ref 101–111)
CO2: 27 mmol/L (ref 22–32)
Calcium: 8.6 mg/dL — ABNORMAL LOW (ref 8.9–10.3)
Creatinine, Ser: 0.56 mg/dL (ref 0.44–1.00)
GFR calc Af Amer: >60 mL/min (ref >60)
GFR calc non Af Amer: >60 mL/min (ref >60)
GLUCOSE: 123 mg/dL — AB (ref 65–99)
POTASSIUM: 4.2 mmol/L (ref 3.5–5.1)
Sodium: 139 mmol/L (ref 135–145)

## 2017-02-16 LAB — HIV ANTIBODY (ROUTINE TESTING W REFLEX): HIV Screen 4th Generation wRfx: NONREACTIVE

## 2017-02-16 MED ORDER — ALBUTEROL SULFATE (2.5 MG/3ML) 0.083% IN NEBU
2.5000 mg | INHALATION_SOLUTION | Freq: Three times a day (TID) | RESPIRATORY_TRACT | Status: DC
  Administered 2017-02-16 – 2017-02-17 (×2): 2.5 mg via RESPIRATORY_TRACT
  Filled 2017-02-16 (×3): qty 3

## 2017-02-16 NOTE — Progress Notes (Signed)
PROGRESS NOTE    Cassandra Montoya  WUJ:[id number]:[id number] DOB: 11/18/1955 DOA: 02/14/2017 [facility]    Brief NarrativeMary L Cassandra Montoya is a 61 y.o. female with medical history significant for COPD and chronic pain, now presenting to the emergency department for evaluation of shortness of breath, fever, and cough.  Patient reports that she was in her usual state until last night when she had the insidious development of fevers, chills, shortness of breath, cough, and rhinorrhea.  Symptoms worsened over the course of the day to the point where she was having difficulty catching her breath at rest.  Denies sick contacts.  Did not receive her flu shot this year.  Reports some right-sided chest pain with cough.    Assessment & Plan:   Principal Problem:   Influenza A Active Problems:   COPD with acute exacerbation (HCC)   Hypokalemia  Influenza A On Tamiflu, continue with supplemental oxygen and supportive care with nebs.  Patient continues to require up to 2 L of nasal cannula oxygen.  Breathing and cough has improved but she is not back to baseline.  ACute COPD exacerbation secondary to influenza A Continue with duo nebs, nasal cannula oxygen as needed IV Solu-Medrol.  Start tapering Solu-Medrol to prednisone.  On exam wheezing has improved.    Chronic pain syndrome resume home medications   Hypokalemia Replaced recheck in the morning is normal       DVT prophylaxis: Lovenox Code Status: Full code Family Communication:familya t bedside.  Disposition Plan: Home tomorrow   Consultants:   None.    Procedures: none.    Antimicrobials:   Subjective: Breathing and coughing is better  Objective: Vitals:   02/16/17 0616 02/16/17 1001 02/16/17 1300 02/16/17 1613  BP: 128/78  117/61   Pulse: 79  (!) 59   Resp: 20  20   Temp: 98.3 F (36.8 C)  98.3 F (36.8 C)   TempSrc: Oral  Oral   SpO2: 95% 96% 98% 97%  Weight:      Height:         Intake/Output Summary (Last 24 hours) at 02/16/2017 1658 Last data filed at 02/16/2017 1245 Gross per 24 hour  Intake 1080 ml  Output 1300 ml  Net -220 ml   Filed Weights   02/14/17 1827 02/15/17 0142  Weight: 61.7 kg (136 lb) 60.6 kg (133 lb 9.6 oz)    Examination:  General exam: Appears calm and comfortable in any kind of distress Respiratory system: Bilateral wheezing has improved no rhonchi or rales, air entry fair Cardiovascular system: S1 & S2 heard, RRR. No JVD, murmurs,  No pedal edema. Gastrointestinal system: Abdomen is nondistended, soft and nontender. No organomegaly or masses felt. Normal bowel sounds heard. Central nervous system: Alert and oriented.  Nonfocal Extremities: Symmetric 5 x 5 power.  No cyanosis or clubbing Skin: No rashes, lesions or ulcers Psychiatry: . Mood & affect appropriate.     Data Reviewed: I have personally reviewed following labs and imaging studies  CBC: Recent Labs  Lab 02/14/17 2014 02/15/17 0546  WBC 9.5 11.1*  NEUTROABS 7.3 10.7*  HGB 13.7 13.4  HCT 41.5 41.9  MCV 97.2 99.1  PLT 307 354   Basic Metabolic Panel: Recent Labs  Lab 02/14/17 2014 02/15/17 0546 02/16/17 0555  NA 138 137 139  K 3.3* 3.7 4.2  CL 102 102 101  CO2 26 28 27   GLUCOSE 109* 120* 123*  BUN 12 10 10   CREATININE  0.65 0.48 0.56  CALCIUM 8.3* 8.2* 8.6*   GFR: Estimated Creatinine Clearance: 61.7 mL/min (by C-G formula based on SCr of 0.56 mg/dL). Liver Function Tests: No results for input(s): AST, ALT, ALKPHOS, BILITOT, PROT, ALBUMIN in the last 168 hours. No results for input(s): LIPASE, AMYLASE in the last 168 hours. No results for input(s): AMMONIA in the last 168 hours. Coagulation Profile: No results for input(s): INR, PROTIME in the last 168 hours. Cardiac Enzymes: No results for input(s): CKTOTAL, CKMB, CKMBINDEX, TROPONINI in the last 168 hours. BNP (last 3 results) No results for input(s): PROBNP in the last 8760  hours. HbA1C: No results for input(s): HGBA1C in the last 72 hours. CBG: No results for input(s): GLUCAP in the last 168 hours. Lipid Profile: No results for input(s): CHOL, HDL, LDLCALC, TRIG, CHOLHDL, LDLDIRECT in the last 72 hours. Thyroid Function Tests: No results for input(s): TSH, T4TOTAL, FREET4, T3FREE, THYROIDAB in the last 72 hours. Anemia Panel: No results for input(s): VITAMINB12, FOLATE, FERRITIN, TIBC, IRON, RETICCTPCT in the last 72 hours. Sepsis Labs: No results for input(s): PROCALCITON, LATICACIDVEN in the last 168 hours.  No results found for this or any previous visit (from the past 240 hour(s)).       Radiology Studies: Dg Chest 2 View  Result Date: 02/14/2017 CLINICAL DATA:  61 year old female with cough and fever. EXAM: CHEST  2 VIEW COMPARISON:  Chest radiograph dated 09/03/2016 FINDINGS: The lungs are clear. There is no pleural effusion or pneumothorax. The cardiac silhouette is within normal limits. No acute osseous pathology. IMPRESSION: No active cardiopulmonary disease. Electronically Signed   By: Elgie CollardArash  Radparvar M.D.   On: 02/14/2017 19:52        Scheduled Meds: . albuterol  2.5 mg Nebulization TID  . aspirin EC  81 mg Oral Daily  . enoxaparin (LOVENOX) injection  40 mg Subcutaneous Q24H  . feeding supplement (ENSURE ENLIVE)  237 mL Oral BID BM  . fluticasone furoate-vilanterol  1 puff Inhalation Daily  . methylPREDNISolone (SOLU-MEDROL) injection  40 mg Intravenous Q12H  . oseltamivir  75 mg Oral BID  . umeclidinium bromide  1 puff Inhalation Daily   Continuous Infusions:   LOS: 2 days    Time spent: 35 minutes.     Kathlen ModyVijaya Alphons Burgert, MD [facility] Pager [phone]   If 7PM-7AM, please contact night-coverage [email or web address] Password [email or web address] 02/16/2017, 4:58 PM

## 2017-02-16 NOTE — Progress Notes (Deleted)
ABG results (pH 6.988, pCO2 is above reportable range, pO2 153 and sO2 97.6%)  given by phone to B. Patraw, RN at [phone]. I am unable to enter results as lab is currently processing patient specimens.

## 2017-02-17 MED ORDER — LORAZEPAM 0.5 MG PO TABS: 0.5000 mg | ORAL_TABLET | Freq: Four times a day (QID) | ORAL | 0 refills | Status: DC | PRN

## 2017-02-17 MED ORDER — PREDNISONE 20 MG PO TABS: ORAL_TABLET | ORAL | 0 refills | Status: DC

## 2017-02-17 MED ORDER — GUAIFENESIN-DM 100-10 MG/5ML PO SYRP: 10.0000 mL | ORAL_SOLUTION | ORAL | 0 refills | Status: DC | PRN

## 2017-02-17 MED ORDER — ENSURE ENLIVE PO LIQD: 237.0000 mL | Freq: Two times a day (BID) | ORAL | 12 refills | Status: DC

## 2017-02-17 MED ORDER — SENNOSIDES-DOCUSATE SODIUM 8.6-50 MG PO TABS: 1.0000 | ORAL_TABLET | Freq: Every evening | ORAL | 0 refills | Status: DC | PRN

## 2017-02-17 MED ORDER — ALBUTEROL SULFATE (2.5 MG/3ML) 0.083% IN NEBU
2.5000 mg | INHALATION_SOLUTION | RESPIRATORY_TRACT | 12 refills | Status: DC | PRN
Start: 2017-02-17 — End: 2020-07-24

## 2017-02-17 MED ORDER — OSELTAMIVIR PHOSPHATE 75 MG PO CAPS: 75.0000 mg | ORAL_CAPSULE | Freq: Two times a day (BID) | ORAL | 0 refills | Status: DC

## 2017-02-17 MED ORDER — BENZONATATE 200 MG PO CAPS: 200.0000 mg | ORAL_CAPSULE | Freq: Three times a day (TID) | ORAL | 0 refills | Status: DC | PRN

## 2017-02-17 NOTE — Progress Notes (Signed)
Patient ambulated to nurses station and down hall then back to room.  02 sats were 97 upon starting and dropped to 94 while ambulating.   Pt denied dizziness, SHOB during ambulation.    Will continue to monitor.

## 2017-02-17 NOTE — Progress Notes (Signed)
AVS printed and discussed with patient.  New prescriptions given to patient.  IV removed. Site clean, dry and intact. Pt taken to care by this RN.  Pt stable upon discharge.

## 2017-02-21 NOTE — Discharge Summary (Signed)
Physician Discharge Summary  Cassandra Montoya:[id number]:[id number] DOB: 02/22/1955 DOA: 02/14/2017  [facility]  Admit date: 02/14/2017 Discharge date: 02/17/2017  Admitted From: Home Disposition:  Home.   Recommendations for Outpatient Follow-up:  1. Follow up with PCP in 1-2 weeks 2. Please obtain BMP/CBC in one week   Discharge Condition:stable.  CODE STATUS: full code.  Diet recommendation: Heart Healthy / Brief/Interim Summary: NarrativeMary L Simpsonis a 62 y.o.femalewith medical history significant forCOPD and chronic pain, now presenting to the emergency department for evaluation of shortness of breath, fever, and cough. Patient reports that she was in her usual state until last night when she had the insidious development of fevers, chills, shortness of breath, cough, and rhinorrhea. Symptoms worsened over the course of the day to the point where she was having difficulty catching her breath at rest.     Discharge Diagnoses:  Principal Problem:   Influenza A Active Problems:   COPD with acute exacerbation (HCC)   Hypokalemia   Influenza A On Tamiflu, continue with supplemental oxygen and supportive care with nebs.  Breathing and cough has improved, discharged on tamiflu to complete the course.   ACute COPD exacerbation secondary to influenza A Continue with duo nebs, nasal cannula oxygen as needed IV Solu-Medrol.  Started tapering Solu-Medrol to prednisone on discharge. .  On exam wheezing has improved.    Chronic pain syndrome resume home medications   Hypokalemia Replaced recheck in the morning is normal    Discharge Instructions  Discharge Instructions    Diet - low sodium heart healthy   Complete by:  As directed    Discharge instructions   Complete by:  As directed    Please follow up with PCP in one week.     Allergies as of 02/17/2017   No Known Allergies     Medication List    STOP taking these  medications   ibuprofen 400 MG tablet Commonly known as:  ADVIL,MOTRIN     TAKE these medications   albuterol (2.5 MG/3ML) 0.083% nebulizer solution Commonly known as:  PROVENTIL Take 3 mLs (2.5 mg total) by nebulization every 4 (four) hours as needed for wheezing or shortness of breath.   aspirin EC 81 MG tablet Take 81 mg by mouth daily.   benzonatate 200 MG capsule Commonly known as:  TESSALON Take 1 capsule (200 mg total) by mouth 3 (three) times daily as needed for cough.   BREO ELLIPTA 200-25 MCG/INH Aepb Generic drug:  fluticasone furoate-vilanterol Inhale 1 puff into the lungs daily.   feeding supplement (ENSURE ENLIVE) Liqd Take 237 mLs by mouth 2 (two) times daily between meals.   guaiFENesin-dextromethorphan 100-10 MG/5ML syrup Commonly known as:  ROBITUSSIN DM Take 10 mLs by mouth every 4 (four) hours as needed for cough.   HYDROcodone-acetaminophen 5-325 MG tablet Commonly known as:  NORCO/VICODIN Take 1 tablet every 6 (six) hours as needed by mouth.   INCRUSE ELLIPTA 62.5 MCG/INH Aepb Generic drug:  umeclidinium bromide Inhale 1 puff into the lungs daily.   LORazepam 0.5 MG tablet Commonly known as:  ATIVAN Take 1 tablet (0.5 mg total) by mouth every 6 (six) hours as needed for anxiety or sleep.   oseltamivir 75 MG capsule Commonly known as:  TAMIFLU Take 1 capsule (75 mg total) by mouth 2 (two) times daily.   predniSONE 20 MG tablet Commonly known as:  DELTASONE Prednisone 40 mg daily for 2 days followed by  Prednisone 20 mg  daily for 3 days and stop.   senna-docusate 8.6-50 MG tablet Commonly known as:  Senokot-S Take 1 tablet by mouth at bedtime as needed for mild constipation.      Follow-up Information    [facility] Schedule an appointment as soon as possible for a visit in 1 week(s).   Contact information: [address] [place] [address] [phone]          No Known  Allergies  Consultations: None.   Procedures/Studies: Dg Chest 2 View  Result Date: 02/14/2017 CLINICAL DATA:  62 year old female with cough and fever. EXAM: CHEST  2 VIEW COMPARISON:  Chest radiograph dated 09/03/2016 FINDINGS: The lungs are clear. There is no pleural effusion or pneumothorax. The cardiac silhouette is within normal limits. No acute osseous pathology. IMPRESSION: No active cardiopulmonary disease. Electronically Signed   By: Elgie Collard M.D.   On: 02/14/2017 19:52       Subjective: No new complaints.   Discharge Exam: Vitals:   02/17/17 0442 02/17/17 0743  BP: 114/70   Pulse: 64   Resp: 19   Temp: 98.1 F (36.7 C)   SpO2: 98% 93%   Vitals:   02/16/17 2040 02/16/17 2130 02/17/17 0442 02/17/17 0743  BP: (!) 121/52  114/70   Pulse: (!) 59  64   Resp:   19   Temp: 98.5 F (36.9 C)  98.1 F (36.7 C)   TempSrc: Oral  Oral   SpO2: 98% 96% 98% 93%  Weight:      Height:        General: Pt is alert, awake, not in acute distress Cardiovascular: RRR, S1/S2 +, no rubs, no gallops Respiratory: CTA bilaterally, no wheezing, no rhonchi Abdominal: Soft, NT, ND, bowel sounds + Extremities: no edema, no cyanosis    The results of significant diagnostics from this hospitalization (including imaging, microbiology, ancillary and laboratory) are listed below for reference.     Microbiology: No results found for this or any previous visit (from the past 240 hour(s)).   Labs: BNP (last 3 results) Recent Labs    09/03/16 1556  BNP 38.0   Basic Metabolic Panel: Recent Labs  Lab 02/15/17 0546 02/16/17 0555  NA 137 139  K 3.7 4.2  CL 102 101  CO2 28 27  GLUCOSE 120* 123*  BUN 10 10  CREATININE 0.48 0.56  CALCIUM 8.2* 8.6*   Liver Function Tests: No results for input(s): AST, ALT, ALKPHOS, BILITOT, PROT, ALBUMIN in the last 168 hours. No results for input(s): LIPASE, AMYLASE in the last 168 hours. No results for input(s): AMMONIA in the last  168 hours. CBC: Recent Labs  Lab 02/15/17 0546  WBC 11.1*  NEUTROABS 10.7*  HGB 13.4  HCT 41.9  MCV 99.1  PLT 354   Cardiac Enzymes: No results for input(s): CKTOTAL, CKMB, CKMBINDEX, TROPONINI in the last 168 hours. BNP: Invalid input(s): POCBNP CBG: No results for input(s): GLUCAP in the last 168 hours. D-Dimer No results for input(s): DDIMER in the last 72 hours. Hgb A1c No results for input(s): HGBA1C in the last 72 hours. Lipid Profile No results for input(s): CHOL, HDL, LDLCALC, TRIG, CHOLHDL, LDLDIRECT in the last 72 hours. Thyroid function studies No results for input(s): TSH, T4TOTAL, T3FREE, THYROIDAB in the last 72 hours.  Invalid input(s): FREET3 Anemia work up No results for input(s): VITAMINB12, FOLATE, FERRITIN, TIBC, IRON, RETICCTPCT in the last 72 hours. Urinalysis No results found for: COLORURINE, APPEARANCEUR, LABSPEC, PHURINE, GLUCOSEU,  HGBUR, BILIRUBINUR, KETONESUR, PROTEINUR, UROBILINOGEN, NITRITE, LEUKOCYTESUR Sepsis Labs Invalid input(s): PROCALCITONIN,  WBC,  LACTICIDVEN Microbiology No results found for this or any previous visit (from the past 240 hour(s)).   Time coordinating discharge: Over 30 minutes  SIGNED:   Kathlen Mody, MD  [facility] 02/21/2017, 10:42 PM Pager   If 7PM-7AM, please contact night-coverage [email or web address] Password [email or web address]

## 2017-12-11 ENCOUNTER — Ambulatory Visit: Payer: 59

## 2017-12-11 ENCOUNTER — Telehealth: Payer: Self-pay

## 2017-12-11 NOTE — Telephone Encounter (Signed)
noted 

## 2017-12-11 NOTE — Telephone Encounter (Signed)
PATIENT WAS A NO SHOW AND LETTER SENT  °

## 2018-01-13 ENCOUNTER — Emergency Department (HOSPITAL_COMMUNITY)
Admission: EM | Admit: 2018-01-13 | Discharge: 2018-01-13 | Disposition: A | Discharge status: Home / Self Care | Payer: BLUE CROSS/BLUE SHIELD | Attending: Emergency Medicine | Admitting: Emergency Medicine

## 2018-01-13 ENCOUNTER — Encounter (HOSPITAL_COMMUNITY): Payer: Self-pay | Admitting: *Deleted

## 2018-01-13 ENCOUNTER — Other Ambulatory Visit: Payer: Self-pay

## 2018-01-13 DIAGNOSIS — F17210 Nicotine dependence, cigarettes, uncomplicated: Secondary | ICD-10-CM | POA: Insufficient documentation

## 2018-01-13 DIAGNOSIS — K047 Periapical abscess without sinus: Secondary | ICD-10-CM | POA: Insufficient documentation

## 2018-01-13 DIAGNOSIS — Z7982 Long term (current) use of aspirin: Secondary | ICD-10-CM | POA: Insufficient documentation

## 2018-01-13 DIAGNOSIS — Z79899 Other long term (current) drug therapy: Secondary | ICD-10-CM | POA: Insufficient documentation

## 2018-01-13 DIAGNOSIS — K0889 Other specified disorders of teeth and supporting structures: Secondary | ICD-10-CM | POA: Diagnosis present

## 2018-01-13 DIAGNOSIS — J449 Chronic obstructive pulmonary disease, unspecified: Secondary | ICD-10-CM | POA: Insufficient documentation

## 2018-01-13 MED ORDER — PENICILLIN V POTASSIUM 500 MG PO TABS: 500.0000 mg | ORAL_TABLET | Freq: Four times a day (QID) | ORAL | 0 refills | Status: AC

## 2018-01-13 MED ORDER — IBUPROFEN 400 MG PO TABS: 400.0000 mg | ORAL_TABLET | Freq: Four times a day (QID) | ORAL | 0 refills | Status: DC | PRN

## 2018-01-13 MED ORDER — IBUPROFEN 400 MG PO TABS
400.0000 mg | ORAL_TABLET | Freq: Once | ORAL | Status: AC
  Administered 2018-01-13: 400 mg via ORAL
  Filled 2018-01-13: qty 1

## 2018-01-13 MED ORDER — ACETAMINOPHEN 325 MG PO TABS
650.0000 mg | ORAL_TABLET | Freq: Once | ORAL | Status: AC
  Administered 2018-01-13: 650 mg via ORAL
  Filled 2018-01-13: qty 2

## 2018-01-13 MED ORDER — ACETAMINOPHEN ER 650 MG PO TBCR: 650.0000 mg | EXTENDED_RELEASE_TABLET | Freq: Three times a day (TID) | ORAL | 0 refills | Status: DC

## 2018-01-13 NOTE — ED Triage Notes (Signed)
Pt c/o right lower tooth pain that has gotten progressively worse over the last day

## 2018-01-13 NOTE — ED Provider Notes (Signed)
[facility] EMERGENCY DEPARTMENT Provider Note   CSN: [id number] Arrival date & time: 01/13/18  0544     History   Chief Complaint Chief Complaint  Patient presents with  . Dental Pain    HPI Cassandra Montoya is a 62 y.o. female.  HPI  62 year old female with history of COPD, asthma, prior dental issues comes in with chief complaint of dental pain.  Patient states that she started having dental pain yesterday.  Her pain is located over the right upper quadrant and it is constant and throbbing.  Patient denies any associated nausea, vomiting, fevers, chills, drooling, difficulty swallowing or opening her mouth.  Past Medical History:  Diagnosis Date  . Asthma   . COPD (chronic obstructive pulmonary disease) (HCC)   . Ovarian cyst    Right ovarian cyst,   . Pneumonia     Patient Active Problem List   Diagnosis Date Noted  . Influenza A 02/14/2017  . Hypokalemia 02/14/2017  . Hyperglycemia, drug-induced 08/13/2015  . COPD with acute exacerbation (HCC) 08/12/2015  . Acute respiratory distress 08/12/2015  . Tobacco abuse 05/25/2014    History reviewed. No pertinent surgical history.   OB History   None      Home Medications    Prior to Admission medications   Medication Sig Start Date End Date Taking? Authorizing Provider  acetaminophen (TYLENOL 8 HOUR) 650 MG CR tablet Take 1 tablet (650 mg total) by mouth every 8 (eight) hours. 01/13/18   Derwood Kaplan, MD  albuterol (PROVENTIL) (2.5 MG/3ML) 0.083% nebulizer solution Take 3 mLs (2.5 mg total) by nebulization every 4 (four) hours as needed for wheezing or shortness of breath. 02/17/17   Kathlen Mody, MD  aspirin EC 81 MG tablet Take 81 mg by mouth daily.    [provider]  benzonatate (TESSALON) 200 MG capsule Take 1 capsule (200 mg total) by mouth 3 (three) times daily as needed for cough. 02/17/17   Kathlen Mody, MD  BREO ELLIPTA 200-25 MCG/INH AEPB Inhale 1 puff into the lungs daily. 05/12/16    [provider]  feeding supplement, ENSURE ENLIVE, (ENSURE ENLIVE) LIQD Take 237 mLs by mouth 2 (two) times daily between meals. 02/17/17   Kathlen Mody, MD  guaiFENesin-dextromethorphan (ROBITUSSIN DM) 100-10 MG/5ML syrup Take 10 mLs by mouth every 4 (four) hours as needed for cough. 02/17/17   Kathlen Mody, MD  HYDROcodone-acetaminophen (NORCO/VICODIN) 5-325 MG tablet Take 1 tablet every 6 (six) hours as needed by mouth. 01/05/17   Zadie Rhine, MD  ibuprofen (ADVIL,MOTRIN) 400 MG tablet Take 1 tablet (400 mg total) by mouth every 6 (six) hours as needed. 01/13/18   Khalik Pewitt, MD  INCRUSE ELLIPTA 62.5 MCG/INH AEPB Inhale 1 puff into the lungs daily. 05/12/16   [provider]  LORazepam (ATIVAN) 0.5 MG tablet Take 1 tablet (0.5 mg total) by mouth every 6 (six) hours as needed for anxiety or sleep. 02/17/17   Kathlen Mody, MD  oseltamivir (TAMIFLU) 75 MG capsule Take 1 capsule (75 mg total) by mouth 2 (two) times daily. 02/17/17   Kathlen Mody, MD  penicillin v potassium (VEETID) 500 MG tablet Take 1 tablet (500 mg total) by mouth 4 (four) times daily for 7 days. 01/13/18 01/20/18  Derwood Kaplan, MD  predniSONE (DELTASONE) 20 MG tablet Prednisone 40 mg daily for 2 days followed by  Prednisone 20 mg daily for 3 days and stop. 02/17/17   Kathlen Mody, MD  senna-docusate (SENOKOT-S) 8.6-50 MG tablet Take 1  tablet by mouth at bedtime as needed for mild constipation. 02/17/17   Kathlen ModyAkula, Vijaya, MD    Family History Family History  Problem Relation Age of Onset  . Stroke Brother   . Hypertension Mother     Social History Social History   Tobacco Use  . Smoking status: Current Every Day Smoker    Packs/day: 0.50    Types: Cigarettes  . Smokeless tobacco: Never Used  Substance Use Topics  . Alcohol use: No  . Drug use: No     Allergies   Patient has no known allergies.   Review of Systems Review of Systems  Constitutional: Positive for activity  change.  HENT: Positive for dental problem.   Gastrointestinal: Negative for vomiting.  Allergic/Immunologic: Negative for immunocompromised state.     Physical Exam Updated Vital Signs BP (!) 185/83 (BP Location: Left Arm)   Pulse 65   Temp 98 F (36.7 C) (Oral)   Resp 20   Ht 4\' 9"  (1.448 m)   Wt 61.7 kg   SpO2 99%   BMI 29.43 kg/m   Physical Exam  Constitutional: She is oriented to person, place, and time. She appears well-developed.  HENT:  Head: Normocephalic and atraumatic.  Patient has a singular tooth in her right upper quadrant which has tenderness to palpation.  There is no peri-gingival fluctuance.  No trismus, no drooling.  Eyes: EOM are normal.  Neck: Normal range of motion. Neck supple.  Cardiovascular: Normal rate.  Pulmonary/Chest: Effort normal.  Abdominal: Bowel sounds are normal.  Neurological: She is alert and oriented to person, place, and time.  Skin: Skin is warm and dry.  Nursing note and vitals reviewed.    ED Treatments / Results  Labs (all labs ordered are listed, but only abnormal results are displayed) Labs Reviewed - No data to display  EKG None  Radiology No results found.  Procedures Procedures (including critical care time)  Medications Ordered in ED Medications  ibuprofen (ADVIL,MOTRIN) tablet 400 mg (has no administration in time range)  acetaminophen (TYLENOL) tablet 650 mg (has no administration in time range)     Initial Impression / Assessment and Plan / ED Course  I have reviewed the triage vital signs and the nursing notes.  Pertinent labs & imaging results that were available during my care of the patient were reviewed by me and considered in my medical decision making (see chart for details).     62 year old female comes in with chief complaint of dental pain.  She is noted to have poor dentition throughout, and singular tooth in the right upper quadrant which is now painful.  There is no abscess for us to  drain.  Patient has no hard signs on exam that would get us concerned about deep space infection.  Patient is not toxic appearing.  We will start her on Pen-Vee K and over-the-counter pain medications.  She has had multiple tooth removed in the past, we will give her dentist follow-up information so that she can see him right away.  Final Clinical Impressions(s) / ED Diagnoses   Final diagnoses:  Dental infection    ED Discharge Orders         Ordered    ibuprofen (ADVIL,MOTRIN) 400 MG tablet  Every 6 hours PRN     01/13/18 0638    acetaminophen (TYLENOL 8 HOUR) 650 MG CR tablet  Every 8 hours     01/13/18 0638    penicillin v potassium (VEETID) 500 MG tablet  4 times daily     01/13/18 1610           Derwood Kaplan, MD 01/13/18 [phone]

## 2018-01-13 NOTE — Discharge Instructions (Addendum)
We saw you in the ER for the toothache. °No evidence of deep infection, no evidence of pus that can be drained out. °YOU WILL NEED TO SEE A DENTIST to get appropriate care. °Please take the antibiotics and pain meds provided in the interval. ° °Return to the ER if there is drooling, difficulty breathing, difficulty opening mouth, fevers, chills, confusion, headaches, seizures. ° °

## 2018-01-14 ENCOUNTER — Encounter (INDEPENDENT_AMBULATORY_CARE_PROVIDER_SITE_OTHER): Payer: Self-pay | Admitting: *Deleted

## 2018-01-20 ENCOUNTER — Ambulatory Visit (INDEPENDENT_AMBULATORY_CARE_PROVIDER_SITE_OTHER): Payer: Self-pay

## 2018-01-20 DIAGNOSIS — Z1211 Encounter for screening for malignant neoplasm of colon: Secondary | ICD-10-CM

## 2018-01-20 NOTE — Progress Notes (Signed)
Pt came in for a nurse visit. She stated [facility] told her to be sure to advise us of her recent weight loss and difficulty swallowing. There is no mention of this in her referral. Pt stated she lost her sense of taste and smell about a year ago and doesn't have much of an appetite and she has been loosing weight. She was 155 pounds 1 year ago, she lost down to 135 pounds over the past year, within the past 2 weeks she has lost down to 123 pounds. She said she has difficultly swallowing and her throat feels sore all the time. I have made her an office visit on 01/31/19 with LSL and pt is aware of appt date and time.

## 2018-01-21 NOTE — Progress Notes (Signed)
Noted, agree. No further recommendations.

## 2018-01-30 ENCOUNTER — Encounter: Payer: Self-pay | Admitting: *Deleted

## 2018-01-30 ENCOUNTER — Encounter: Payer: Self-pay | Admitting: Gastroenterology

## 2018-01-30 ENCOUNTER — Ambulatory Visit: Payer: BLUE CROSS/BLUE SHIELD | Admitting: Gastroenterology

## 2018-01-30 ENCOUNTER — Other Ambulatory Visit: Payer: Self-pay | Admitting: *Deleted

## 2018-01-30 ENCOUNTER — Telehealth: Payer: Self-pay | Admitting: *Deleted

## 2018-01-30 VITALS — BP 128/68 | HR 68 | Temp 97.0°F | Ht <= 58 in | Wt 121.2 lb

## 2018-01-30 DIAGNOSIS — R1310 Dysphagia, unspecified: Secondary | ICD-10-CM | POA: Diagnosis not present

## 2018-01-30 DIAGNOSIS — R1013 Epigastric pain: Secondary | ICD-10-CM | POA: Insufficient documentation

## 2018-01-30 DIAGNOSIS — R1319 Other dysphagia: Secondary | ICD-10-CM

## 2018-01-30 DIAGNOSIS — R634 Abnormal weight loss: Secondary | ICD-10-CM | POA: Diagnosis not present

## 2018-01-30 DIAGNOSIS — R630 Anorexia: Secondary | ICD-10-CM

## 2018-01-30 DIAGNOSIS — R1900 Intra-abdominal and pelvic swelling, mass and lump, unspecified site: Secondary | ICD-10-CM

## 2018-01-30 MED ORDER — PEG 3350-KCL-NA BICARB-NACL 420 G PO SOLR: 4000.0000 mL | Freq: Once | ORAL | 0 refills | Status: AC

## 2018-01-30 MED ORDER — PANTOPRAZOLE SODIUM 40 MG PO TBEC: 40.0000 mg | DELAYED_RELEASE_TABLET | Freq: Every day | ORAL | 5 refills | Status: DC

## 2018-01-30 NOTE — Progress Notes (Signed)
Primary Care Physician:  [facility]  Primary Gastroenterologist:  Roetta Sessions, MD   Chief Complaint  Patient presents with  . Weight Loss    weighed 159 in July.   Marland Kitchen Dysphagia    feels like something is stuck on her throat  . Consult    tcs- never had one prior    HPI:  Cassandra Montoya is a 62 y.o. female here for further evaluation of dysphagia, colonoscopy, weight loss.  Patient came in for nurse visit to schedule screening colonoscopy previously.  Patient reported that she was advised by her PCP to make sure that we were aware that she had recent weight loss and difficulty swallowing as well.  This information was not provided with the referral papers.  She presents today for office visit to evaluate these concerns.  Patient reports that about two years ago she lost sense of taste and smell, and diminished appetite. Doesn't know why. Around that time her son was murdered. She doesn't feel like her loss of appetite is related.  Reports being 155 pounds before. Gradually losing down to 135 over the past year.  Recently down to 123.  According to records available in epic, her highest weight in the past year was 142 pounds in October 2018.  01/2017 at ED 133 lb 10/2017 at PCP 128 lb 01/13/18 at ED 136 lb 01/30/18 here 121 lb  Eats one meal per day. Eats because she has to.  Eats soft foods, soups, green beans, not a lot of meat or bread. Drinks mostly water. Still smoking. No etoh. denies heartburn. Feels like food doesn't want to go down. Feels like throat gets "clogged" up but can't get anything up. Having problem swallowing larger pills. Coughs up about a cup of phlegm every day after waking up. BM daily, small amounts. No melena, brbpr. Also complains of throat/neck pain more on right. Headaches.   No previous upper endoscopy or colonoscopy.     Current Outpatient Medications  Medication Sig Dispense Refill  . albuterol (PROVENTIL HFA;VENTOLIN HFA) 108  (90 Base) MCG/ACT inhaler Inhale 1-2 puffs into the lungs every 6 (six) hours as needed for wheezing or shortness of breath.    Marland Kitchen albuterol (PROVENTIL) (2.5 MG/3ML) 0.083% nebulizer solution Take 3 mLs (2.5 mg total) by nebulization every 4 (four) hours as needed for wheezing or shortness of breath. 75 mL 12  . aspirin EC 81 MG tablet Take 81 mg by mouth daily.    Marland Kitchen BREO ELLIPTA 200-25 MCG/INH AEPB Inhale 1 puff into the lungs daily.    . feeding supplement, ENSURE ENLIVE, (ENSURE ENLIVE) LIQD Take 237 mLs by mouth 2 (two) times daily between meals. (Patient taking differently: Take 237 mLs by mouth as needed. ) 237 mL 12  . ibuprofen (ADVIL,MOTRIN) 400 MG tablet Take 1 tablet (400 mg total) by mouth every 6 (six) hours as needed. 30 tablet 0  . INCRUSE ELLIPTA 62.5 MCG/INH AEPB Inhale 1 puff into the lungs daily.    Marland Kitchen LORazepam (ATIVAN) 0.5 MG tablet Take 1 tablet (0.5 mg total) by mouth every 6 (six) hours as needed for anxiety or sleep. 6 tablet 0   No current facility-administered medications for this visit.     Allergies as of 01/30/2018  . (No Known Allergies)    Past Medical History:  Diagnosis Date  . Asthma   . COPD (chronic obstructive pulmonary disease) (HCC)   . Ovarian cyst    Right ovarian cyst,   .  Pneumonia     History reviewed. No pertinent surgical history.  Family History  Problem Relation Age of Onset  . Stroke Brother   . Hypertension Mother   . Colon cancer Neg Hx     Social History   Socioeconomic History  . Marital status: Widowed    Spouse name: Not on file  . Number of children: Not on file  . Years of education: Not on file  . Highest education level: Not on file  Occupational History  . Not on file  Social Needs  . Financial resource strain: Not on file  . Food insecurity:    Worry: Not on file    Inability: Not on file  . Transportation needs:    Medical: Not on file    Non-medical: Not on file  Tobacco Use  . Smoking status: Current  Every Day Smoker    Packs/day: 0.50    Types: Cigarettes  . Smokeless tobacco: Never Used  Substance and Sexual Activity  . Alcohol use: No  . Drug use: No  . Sexual activity: Not on file  Lifestyle  . Physical activity:    Days per week: Not on file    Minutes per session: Not on file  . Stress: Not on file  Relationships  . Social connections:    Talks on phone: Not on file    Gets together: Not on file    Attends religious service: Not on file    Active member of club or organization: Not on file    Attends meetings of clubs or organizations: Not on file    Relationship status: Not on file  . Intimate partner violence:    Fear of current or ex partner: Not on file    Emotionally abused: Not on file    Physically abused: Not on file    Forced sexual activity: Not on file  Other Topics Concern  . Not on file  Social History Narrative  . Not on file      ROS:  General: Negative for fever, chills, fatigue, weakness. See hpi Eyes: Negative for vision changes.  ENT: Negative for hoarseness,   nasal congestion. See hpi CV: Negative for chest pain, angina, palpitations, dyspnea on exertion, peripheral edema.  Respiratory: Negative for dyspnea at rest,   wheezing. See hpi. Also has some DOE with incline. Still able to work. GI: See history of present illness. GU:  Negative for dysuria, hematuria, urinary incontinence, urinary frequency, nocturnal urination.  MS: Negative for joint pain, low back pain.  Derm: Negative for rash or itching.  Neuro: Negative for weakness, abnormal sensation, seizure, frequent headaches, memory loss, confusion.  Psych: Negative for anxiety, depression, suicidal ideation, hallucinations.  Endo: Negative for unusual weight change.  Heme: Negative for bruising or bleeding. Allergy: Negative for rash or hives.    Physical Examination:  BP 128/68   Pulse 68   Temp (!) 97 F (36.1 C) (Oral)   Ht 4\' 9"  (1.448 m)   Wt 121 lb 3.2 oz (55 kg)    BMI 26.23 kg/m    General: Well-nourished, well-developed in no acute distress.  Head: Normocephalic, atraumatic.   Eyes: Conjunctiva pink, no icterus. Mouth: Oropharyngeal mucosa moist and pink , no lesions erythema or exudate. Neck: Supple without thyromegaly, masses, or lymphadenopathy.  Lungs: Clear to auscultation bilaterally.  Heart: Regular rate and rhythm, no murmurs rubs or gallops.  Abdomen: Bowel sounds are normal, n  nondistended, no hepatosplenomegaly, no abdominal bruits or  hernia , no rebound or guarding.  Moderate epigastric tenderness with deep palpation, palpable mass in epigastrium ?left hepatic lobe. Rectal: not performed Extremities: No lower extremity edema. No clubbing or deformities.  Neuro: Alert and oriented x 4 , grossly normal neurologically.  Skin: Warm and dry, no rash or jaundice.   Psych: Alert and cooperative, normal mood and affect.     Impression/Plan:  62 y/o female presenting with 2 year history of loss of appetite, gradual weight loss, loss of taste/smell. Likely not consuming sufficient calories to maintain weight. She has some vague dysphagia issues and epigastric tenderness on exam with palpable mass (?left hepatic lobe). No prior colonoscopy.   Plan on EGD/ED/TCS in near future with Dr. Jena Gaussourk.  I have discussed the risks, alternatives, benefits with regards to but not limited to the risk of reaction to medication, bleeding, infection, perforation and the patient is agreeable to proceed. Written consent to be obtained.  Start pantoprazole 40mg  daily daily. Labs and CT A/P with contrast to evaluate weight loss and abdominal mass. Further recommendations to follow.

## 2018-01-30 NOTE — Progress Notes (Signed)
CC'D TO PCP °

## 2018-01-30 NOTE — Patient Instructions (Addendum)
1. Start pantoprazole once daily before first meal of your day.  2. Please have your labs and CT done.  3. Upper endoscopy and colonoscopy as scheduled. See separate instructions.

## 2018-01-30 NOTE — Telephone Encounter (Signed)
PA done for CT ABD/PELVIS w/ contrast. Order ID [id number] Dates 01/30/18-02/28/18.

## 2018-02-14 ENCOUNTER — Ambulatory Visit (HOSPITAL_COMMUNITY)
Admission: RE | Admit: 2018-02-14 | Discharge: 2018-02-14 | Disposition: A | Discharge status: Home / Self Care | Payer: BLUE CROSS/BLUE SHIELD | Source: Ambulatory Visit | Attending: Gastroenterology | Admitting: Gastroenterology

## 2018-02-14 ENCOUNTER — Encounter (HOSPITAL_COMMUNITY): Payer: Self-pay

## 2018-02-14 DIAGNOSIS — R634 Abnormal weight loss: Secondary | ICD-10-CM

## 2018-02-14 DIAGNOSIS — R1900 Intra-abdominal and pelvic swelling, mass and lump, unspecified site: Secondary | ICD-10-CM

## 2018-02-27 ENCOUNTER — Telehealth: Payer: Self-pay

## 2018-02-27 NOTE — Telephone Encounter (Addendum)
Entry for 02/27/18: [employer] and spoke to [place], subscriber ID# pt gave me didn't pull up in their system. Rep pulled up pt by name and DOB. 2 ID#'s came up for pt and rep stated both #'s were inactive. Called pt, she will bring her new insurance card by our office tomorrow morning. She is aware office closes at 12:00pm.

## 2018-02-27 NOTE — Telephone Encounter (Signed)
Received call from [place] at pre-service center. Pt rescheduled CT abd/pelvis to 03/04/18. Prior authorization approval expires 02/28/18. Unable to complete PA online. Called pt, she still has [employer]. Called [employer], member ID on card in chart was termed 02/18/18. She has new member ID. Rep unable to complete request as their system was down. She advised to re try later today or tomorrow. Called pt back, she gave new ID# [id number].

## 2018-03-03 NOTE — Telephone Encounter (Signed)
New insurance card had been scanned in chart. Submitted PA for CT abd/pelvis via [employer] website. Case approved. PA# [id number], 03/03/18-04/01/18. Tried to call Melanie back at pre-service center, no answer, LMOVM to inform her of new PA#.

## 2018-03-03 NOTE — Telephone Encounter (Signed)
Called and informed pt, PA was done for CT tomorrow.

## 2018-03-04 ENCOUNTER — Ambulatory Visit (HOSPITAL_COMMUNITY)
Admission: RE | Admit: 2018-03-04 | Discharge: 2018-03-04 | Disposition: A | Discharge status: Home / Self Care | Payer: BLUE CROSS/BLUE SHIELD | Source: Ambulatory Visit | Attending: Gastroenterology | Admitting: Gastroenterology

## 2018-03-04 DIAGNOSIS — R634 Abnormal weight loss: Secondary | ICD-10-CM | POA: Insufficient documentation

## 2018-03-04 DIAGNOSIS — R1900 Intra-abdominal and pelvic swelling, mass and lump, unspecified site: Secondary | ICD-10-CM | POA: Insufficient documentation

## 2018-03-04 LAB — POCT I-STAT CREATININE: Creatinine, Ser: 0.6 mg/dL (ref 0.44–1.00)

## 2018-03-04 MED ORDER — IOPAMIDOL (ISOVUE-300) INJECTION 61%
100.0000 mL | Freq: Once | INTRAVENOUS | Status: AC | PRN
  Administered 2018-03-04: 100 mL via INTRAVENOUS

## 2018-03-25 ENCOUNTER — Telehealth: Payer: Self-pay

## 2018-03-25 NOTE — Telephone Encounter (Signed)
Called pt, TCS/EGD/DIL for tomorrow moved up to 10:30am. She will arrive at 9:30am. Advised her to start drinking 2nd half of prep at 5:30am tomorrow morning. NPO after 7:30am. LMOVM for endo scheduler.

## 2018-03-26 ENCOUNTER — Encounter (HOSPITAL_COMMUNITY): Payer: Self-pay | Admitting: *Deleted

## 2018-03-26 ENCOUNTER — Other Ambulatory Visit: Payer: Self-pay

## 2018-03-26 ENCOUNTER — Ambulatory Visit (HOSPITAL_COMMUNITY)
Admission: RE | Admit: 2018-03-26 | Discharge: 2018-03-26 | Disposition: A | Discharge status: Home / Self Care | Payer: BLUE CROSS/BLUE SHIELD | Attending: Internal Medicine | Admitting: Internal Medicine

## 2018-03-26 ENCOUNTER — Encounter (HOSPITAL_COMMUNITY)
Admission: RE | Disposition: A | Discharge status: Home / Self Care | Payer: Self-pay | Source: Home / Self Care | Attending: Internal Medicine

## 2018-03-26 DIAGNOSIS — R1310 Dysphagia, unspecified: Secondary | ICD-10-CM

## 2018-03-26 DIAGNOSIS — Z7951 Long term (current) use of inhaled steroids: Secondary | ICD-10-CM | POA: Diagnosis not present

## 2018-03-26 DIAGNOSIS — F17210 Nicotine dependence, cigarettes, uncomplicated: Secondary | ICD-10-CM | POA: Insufficient documentation

## 2018-03-26 DIAGNOSIS — K5520 Angiodysplasia of colon without hemorrhage: Secondary | ICD-10-CM | POA: Diagnosis not present

## 2018-03-26 DIAGNOSIS — Z1211 Encounter for screening for malignant neoplasm of colon: Secondary | ICD-10-CM | POA: Insufficient documentation

## 2018-03-26 DIAGNOSIS — R1319 Other dysphagia: Secondary | ICD-10-CM

## 2018-03-26 DIAGNOSIS — K210 Gastro-esophageal reflux disease with esophagitis: Secondary | ICD-10-CM | POA: Diagnosis not present

## 2018-03-26 DIAGNOSIS — R634 Abnormal weight loss: Secondary | ICD-10-CM | POA: Insufficient documentation

## 2018-03-26 DIAGNOSIS — K259 Gastric ulcer, unspecified as acute or chronic, without hemorrhage or perforation: Secondary | ICD-10-CM | POA: Insufficient documentation

## 2018-03-26 DIAGNOSIS — K209 Esophagitis, unspecified: Secondary | ICD-10-CM | POA: Diagnosis not present

## 2018-03-26 DIAGNOSIS — K449 Diaphragmatic hernia without obstruction or gangrene: Secondary | ICD-10-CM | POA: Insufficient documentation

## 2018-03-26 DIAGNOSIS — Z79899 Other long term (current) drug therapy: Secondary | ICD-10-CM | POA: Diagnosis not present

## 2018-03-26 DIAGNOSIS — R630 Anorexia: Secondary | ICD-10-CM

## 2018-03-26 DIAGNOSIS — K222 Esophageal obstruction: Secondary | ICD-10-CM | POA: Insufficient documentation

## 2018-03-26 DIAGNOSIS — Z7982 Long term (current) use of aspirin: Secondary | ICD-10-CM | POA: Insufficient documentation

## 2018-03-26 DIAGNOSIS — J449 Chronic obstructive pulmonary disease, unspecified: Secondary | ICD-10-CM | POA: Diagnosis not present

## 2018-03-26 DIAGNOSIS — R1013 Epigastric pain: Secondary | ICD-10-CM

## 2018-03-26 HISTORY — PX: ESOPHAGOGASTRODUODENOSCOPY: SHX5428

## 2018-03-26 HISTORY — PX: MALONEY DILATION: SHX5535

## 2018-03-26 HISTORY — PX: BIOPSY: SHX5522

## 2018-03-26 HISTORY — PX: COLONOSCOPY: SHX5424

## 2018-03-26 SURGERY — COLONOSCOPY
Anesthesia: Moderate Sedation

## 2018-03-26 MED ORDER — MIDAZOLAM HCL 5 MG/5ML IJ SOLN
INTRAMUSCULAR | Status: AC
  Filled 2018-03-26: qty 10

## 2018-03-26 MED ORDER — LIDOCAINE VISCOUS HCL 2 % MT SOLN
OROMUCOSAL | Status: AC
  Filled 2018-03-26: qty 15

## 2018-03-26 MED ORDER — ONDANSETRON HCL 4 MG/2ML IJ SOLN
INTRAMUSCULAR | Status: AC
  Filled 2018-03-26: qty 2

## 2018-03-26 MED ORDER — MIDAZOLAM HCL 5 MG/5ML IJ SOLN
INTRAMUSCULAR | Status: DC | PRN
  Administered 2018-03-26 (×4): 1 mg via INTRAVENOUS
  Administered 2018-03-26: 2 mg via INTRAVENOUS
  Administered 2018-03-26: 1 mg via INTRAVENOUS

## 2018-03-26 MED ORDER — MEPERIDINE HCL 100 MG/ML IJ SOLN
INTRAMUSCULAR | Status: DC | PRN
  Administered 2018-03-26: 25 mg via INTRAVENOUS
  Administered 2018-03-26: 15 mg via INTRAVENOUS

## 2018-03-26 MED ORDER — ONDANSETRON HCL 4 MG/2ML IJ SOLN
INTRAMUSCULAR | Status: DC | PRN
  Administered 2018-03-26: 4 mg via INTRAVENOUS

## 2018-03-26 MED ORDER — LIDOCAINE VISCOUS HCL 2 % MT SOLN
OROMUCOSAL | Status: DC | PRN
  Administered 2018-03-26: 1 via OROMUCOSAL

## 2018-03-26 MED ORDER — SODIUM CHLORIDE 0.9 % IV SOLN
INTRAVENOUS | Status: DC
  Administered 2018-03-26: 1000 mL via INTRAVENOUS

## 2018-03-26 MED ORDER — MEPERIDINE HCL 50 MG/ML IJ SOLN
INTRAMUSCULAR | Status: AC
  Filled 2018-03-26: qty 1

## 2018-03-26 NOTE — H&P (Signed)
@LOGO @   Primary Care Physician:  [facility] Primary Gastroenterologist:  Dr. Jena Gauss  Pre-Procedure History & Physical: HPI:  Cassandra Montoya is a 63 y.o. female here for further evaluation of dysphagia/weight loss via EGD and possible EGD D along with first ever screening colonoscopy.  Recent CT negative  Past Medical History:  Diagnosis Date  . Asthma   . COPD (chronic obstructive pulmonary disease) (HCC)   . Ovarian cyst    Right ovarian cyst,   . Pneumonia     History reviewed. No pertinent surgical history.  Prior to Admission medications   Medication Sig Start Date End Date Taking? Authorizing Provider  albuterol (PROVENTIL HFA;VENTOLIN HFA) 108 (90 Base) MCG/ACT inhaler Inhale 1-2 puffs into the lungs every 6 (six) hours as needed for wheezing or shortness of breath.   Yes [provider]  aspirin EC 81 MG tablet Take 81 mg by mouth daily.   Yes [provider]  Aspirin-Salicylamide-Caffeine (BC HEADACHE POWDER PO) Take by mouth.   Yes [provider]  feeding supplement, ENSURE ENLIVE, (ENSURE ENLIVE) LIQD Take 237 mLs by mouth 2 (two) times daily between meals. Patient taking differently: Take 237 mLs by mouth 2 (two) times a week.  02/17/17  Yes Kathlen Mody, MD  Fluticasone-Umeclidin-Vilant (TRELEGY ELLIPTA) 100-62.5-25 MCG/INH AEPB Inhale into the lungs.   Yes [provider]  albuterol (PROVENTIL) (2.5 MG/3ML) 0.083% nebulizer solution Take 3 mLs (2.5 mg total) by nebulization every 4 (four) hours as needed for wheezing or shortness of breath. 02/17/17   Kathlen Mody, MD  BREO ELLIPTA 200-25 MCG/INH AEPB Inhale 1 puff into the lungs daily. 05/12/16   [provider]  ibuprofen (ADVIL,MOTRIN) 400 MG tablet Take 1 tablet (400 mg total) by mouth every 6 (six) hours as needed. 01/13/18   Nanavati, Ankit, MD  INCRUSE ELLIPTA 62.5 MCG/INH AEPB Inhale 1 puff into the lungs daily. 05/12/16   [provider]  pantoprazole (PROTONIX) 40 MG tablet Take 1 tablet (40 mg total) by mouth daily before breakfast. 01/30/18   Tiffany Kocher, PA-C    Allergies as of 01/30/2018  . (No Known Allergies)    Family History  Problem Relation Age of Onset  . Stroke Brother   . Hypertension Mother   . Colon cancer Neg Hx     Social History   Socioeconomic History  . Marital status: Widowed    Spouse name: Not on file  . Number of children: Not on file  . Years of education: Not on file  . Highest education level: Not on file  Occupational History  . Not on file  Social Needs  . Financial resource strain: Not on file  . Food insecurity:    Worry: Not on file    Inability: Not on file  . Transportation needs:    Medical: Not on file    Non-medical: Not on file  Tobacco Use  . Smoking status: Current Every Day Smoker    Packs/day: 0.50    Types: Cigarettes  . Smokeless tobacco: Never Used  Substance and Sexual Activity  . Alcohol use: No  . Drug use: No  . Sexual activity: Not on file  Lifestyle  . Physical activity:    Days per week: Not on file    Minutes per session: Not on file  . Stress: Not on file  Relationships  . Social connections:    Talks on phone: Not on file  Gets together: Not on file    Attends religious service: Not on file    Active member of club or organization: Not on file    Attends meetings of clubs or organizations: Not on file    Relationship status: Not on file  . Intimate partner violence:    Fear of current or ex partner: Not on file    Emotionally abused: Not on file    Physically abused: Not on file    Forced sexual activity: Not on file  Other Topics Concern  . Not on file  Social History Narrative  . Not on file    Review of Systems: See HPI, otherwise negative ROS  Physical Exam: BP (!) 152/62   Pulse 62   Temp 97.7 F (36.5 C) (Oral)   Resp 19   Ht 4\' 9"  (1.448 m)   Wt 54.4 kg   SpO2 98%   BMI 25.97 kg/m   General:   Alert,  Well-developed, well-nourished, pleasant and cooperative in NAD Neck:  Supple; no masses or thyromegaly. No significant cervical adenopathy. Lungs:  Clear throughout to auscultation.   No wheezes, crackles, or rhonchi. No acute distress. Heart:  Regular rate and rhythm; no murmurs, clicks, rubs,  or gallops. Abdomen: Non-distended, normal bowel sounds.  Soft and nontender without appreciable mass or hepatosplenomegaly.  Pulses:  Normal pulses noted. Extremities:  Without clubbing or edema.  Impression/Plan: 63 year old lady with esophageal dysphagia and weight loss.  Recent negative CT.  No prior colonoscopy EGD with ED and colonoscopy now being performed.  The risks, benefits, limitations, imponderables and alternatives regarding both EGD and colonoscopy have been reviewed with the patient. Questions have been answered. All parties agreeable.      Notice: This dictation was prepared with Dragon dictation along with smaller phrase technology. Any transcriptional errors that result from this process are unintentional and may not be corrected upon review.

## 2018-03-26 NOTE — Op Note (Addendum)
[facility] Patient Name: Cassandra FlesherMary Montoya Procedure Date: 03/26/2018 9:46 AM MRN: [id number] Date of Birth: 05/18/1955 Attending MD: Gennette Pacobert Michael Rourk , MD CSN: [id number] Age: 63 Admit Type: Outpatient Procedure:                Upper GI endoscopy Indications:              Dysphagia Providers:                Gennette Pacobert Michael Rourk, MD, Nena PolioLisa Moore, RN, Sterling Bigiffani                            Roberts, RN, Burke Keelsrisann Tilley, Technician Referring MD:              Medicines:                Midazolam 6 mg IV, Meperidine 40 mg IV Complications:            No immediate complications. Estimated Blood Loss:     Estimated blood loss was minimal. Estimated blood                            loss was minimal. Procedure:                Pre-Anesthesia Assessment:                           - Prior to the procedure, a History and Physical                            was performed, and patient medications and                            allergies were reviewed. The patient's tolerance of                            previous anesthesia was also reviewed. The risks                            and benefits of the procedure and the sedation                            options and risks were discussed with the patient.                            All questions were answered, and informed consent                            was obtained. Prior Anticoagulants: The patient has                            taken no previous anticoagulant or antiplatelet                            agents. ASA Grade Assessment: II - A patient with  mild systemic disease. After reviewing the risks                            and benefits, the patient was deemed in                            satisfactory condition to undergo the procedure.                           After obtaining informed consent, the endoscope was                            passed under direct vision. Throughout the                            procedure,  the patient's blood pressure, pulse, and                            oxygen saturations were monitored continuously. The                            GIF-H190 ([id number]) scope was introduced through the                            mouth, and advanced to the second part of duodenum.                            The upper GI endoscopy was accomplished without                            difficulty. The patient tolerated the procedure                            well. Scope In: 10:28:44 AM Scope Out: 10:41:06 AM Total Procedure Duration: 0 hours 12 minutes 22 seconds  Findings:      Esophagitis was found. Multiple erosions within 5 mm of the GE junction       straddling a Schatzki's ring. No mass. No Barrett's epithelium seen      A mild Schatzki ring was found at the gastroesophageal junction.      One gastric ulcer was found in the gastric antrum. Meters. Elliptical.       Clean base. Multiple satellite erosions present. Small hiatal hernia. No       infiltrating process observed.      The duodenal bulb and second portion of the duodenum were normal.       Finally, the gastric ulcer was biopsied with a cold forceps for       histology. Estimated blood loss was minimal. Impression:               Mild erosive reflux esophagitis. Schatzki's ring.                            Status post Maloney dilation #disruption. Hiatal  hernia. Gastric ulcer?"biopsied. Multiple gastric                            erosions.                           - Normal duodenal bulb and second portion of the                            duodenum. Moderate Sedation:      Moderate (conscious) sedation was administered by the endoscopy nurse       and supervised by the endoscopist. The following parameters were       monitored: oxygen saturation, heart rate, blood pressure, respiratory       rate, EKG, adequacy of pulmonary ventilation, and response to care.       Total physician intraservice time was 18  minutes. Recommendation:           - Patient has a contact number available for                            emergencies. The signs and symptoms of potential                            delayed complications were discussed with the                            patient. Return to normal activities tomorrow.                            Written discharge instructions were provided to the                            patient.                           - Advance diet as tolerated.                           - Continue present medications. Begin Protonix 40                            mg twice daily. Stop taking NSAIDs. Follow-up on                            pathology.                           - Repeat upper endoscopy in 3 months for                            surveillance.                           - Return to GI clinic in 3 months. See colonoscopy  report. Procedure Code(s):        --- Professional ---                           [id number], Esophagogastroduodenoscopy, flexible,                            transoral; with biopsy, single or multiple                           43450, Dilation of esophagus, by unguided sound or                            bougie, single or multiple passes                           G0500, Moderate sedation services provided by the                            same physician or other qualified health care                            professional performing a gastrointestinal                            endoscopic service that sedation supports,                            requiring the presence of an independent trained                            observer to assist in the monitoring of the                            patient's level of consciousness and physiological                            status; initial 15 minutes of intra-service time;                            patient age 38 years or older (additional time may                            be reported with  [phone], as appropriate) Diagnosis Code(s):        --- Professional ---                           K20.9, Esophagitis, unspecified                           K22.2, Esophageal obstruction                           K25.9, Gastric ulcer, unspecified as acute or  chronic, without hemorrhage or perforation                           R13.10, Dysphagia, unspecified CPT copyright 2018 American Medical Association. All rights reserved. The codes documented in this report are preliminary and upon coder review may  be revised to meet current compliance requirements. Gerrit Friends. Rourk, MD Gennette Pac, MD 03/26/2018 10:49:28 AM This report has been signed electronically. Number of Addenda: 1 Addendum Number: 1   Addendum Date: 04/02/2018 9:26:41 AM      Esophagus was dilated with a 54 French Maloney dilator. A look back       revealed improvement in lumen. No apparent complication. Blood loss       minimal. Gerrit Friends. Rourk, MD Gennette Pac, MD 04/02/2018 9:27:10 AM This report has been signed electronically.

## 2018-03-26 NOTE — Op Note (Signed)
[facility] Patient Name: Cassandra FlesherMary Montoya Procedure Date: 03/26/2018 9:42 AM MRN: [id number] Date of Birth: 06/07/1955 Attending MD: Gennette Pacobert Michael Aarush Stukey , MD CSN: [id number] Age: [age] Admit Type: Outpatient Procedure:                Colonoscopy Indications:              Screening for colorectal malignant neoplasm Providers:                Gennette Pacobert Michael Dixie Coppa, MD, Nena PolioLisa Moore, RN, Sterling Bigiffani                            Roberts, RN, Burke Keelsrisann Tilley, Technician Referring MD:              Medicines:                Midazolam 7 mg IV, Meperidine 40 mg IV, Ondansetron                            4 mg IV Complications:            No immediate complications. Estimated Blood Loss:     Estimated blood loss: none. Procedure:                Pre-Anesthesia Assessment:                           - Prior to the procedure, a History and Physical                            was performed, and patient medications and                            allergies were reviewed. The patient's tolerance of                            previous anesthesia was also reviewed. The risks                            and benefits of the procedure and the sedation                            options and risks were discussed with the patient.                            All questions were answered, and informed consent                            was obtained. Prior Anticoagulants: The patient has                            taken no previous anticoagulant or antiplatelet                            agents. ASA Grade Assessment: II - A patient with  mild systemic disease. After reviewing the risks                            and benefits, the patient was deemed in                            satisfactory condition to undergo the procedure.                           After obtaining informed consent, the colonoscope                            was passed under direct vision. Throughout the   procedure, the patient's blood pressure, pulse, and                            oxygen saturations were monitored continuously. The                            CF-[id number] ([id number]) scope was introduced through                            the anus and advanced to the the cecum, identified                            by appendiceal orifice and ileocecal valve. The                            colonoscopy was performed without difficulty. The                            patient tolerated the procedure well. The quality                            of the bowel preparation was adequate. The                            ileocecal valve, appendiceal orifice, and rectum                            were photographed. The entire colon was well                            visualized. Scope In: 10:50:24 AM Scope Out: 11:03:58 AM Scope Withdrawal Time: 0 hours 6 minutes 51 seconds  Total Procedure Duration: 0 hours 13 minutes 34 seconds  Findings:      The perianal and digital rectal examinations were normal.      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      The retroflexed view of the distal rectum and anal verge was normal and       showed no anal or rectal abnormalities. 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                           - The distal rectum and anal verge are normal on                            retroflexion view.                           - No specimens collected. Moderate Sedation:      Moderate (conscious) sedation was administered by the endoscopy nurse       and supervised by the endoscopist. The following parameters were       monitored: oxygen saturation, heart rate, blood pressure, and response       to care. Total physician intraservice time was 40 minutes. Recommendation:           - Patient has a contact number available for                            emergencies. The signs and symptoms of  potential                            delayed complications were discussed with the                            patient. Return to normal activities tomorrow.                            Written discharge instructions were provided to the                            patient.                           - Resume previous diet.                           - Continue present medications.                           - Repeat colonoscopy in 10 years for screening                            purposes.                           - Return to GI office in 3 months. See EGD report. Procedure Code(s):        --- Professional ---                           [id number], Colonoscopy, flexible; diagnostic, including                            collection of specimen(s) by brushing or washing,  when performed (separate procedure)                           [id number], Moderate sedation; each additional 15                            minutes intraservice time                           [id number], Moderate sedation; each additional 15                            minutes intraservice time                           [id number], Moderate sedation services provided by the                            same physician or other qualified health care                            professional performing a gastrointestinal                            endoscopic service that sedation supports,                            requiring the presence of an independent trained                            observer to assist in the monitoring of the                            patient's level of consciousness and physiological                            status; initial 15 minutes of intra-service time;                            patient age 47 years or older (additional time may                            be reported with [id number], as appropriate) Diagnosis Code(s):        --- Professional ---                           Z12.11, Encounter for screening  for malignant                            neoplasm of colon CPT copyright 2018 [employer] Association. All rights reserved. The codes documented in this report are preliminary and upon coder review may  be revised to meet current compliance requirements. Gerrit Friends. Yanet Balliet, MD Gennette Pac, MD 03/26/2018 11:07:49 AM This report has been signed electronically. Number of Addenda: 0

## 2018-03-26 NOTE — Discharge Instructions (Signed)
Colonoscopy Discharge Instructions  Read the instructions outlined below and refer to this sheet in the next few weeks. These discharge instructions provide you with general information on caring for yourself after you leave the hospital. Your doctor may also give you specific instructions. While your treatment has been planned according to the most current medical practices available, unavoidable complications occasionally occur. If you have any problems or questions after discharge, call Dr. Jena Gauss at [phone]. ACTIVITY  You may resume your regular activity, but move at a slower pace for the next 24 hours.   Take frequent rest periods for the next 24 hours.   Walking will help get rid of the air and reduce the bloated feeling in your belly (abdomen).   No driving for 24 hours (because of the medicine (anesthesia) used during the test).    Do not sign any important legal documents or operate any machinery for 24 hours (because of the anesthesia used during the test).  NUTRITION  Drink plenty of fluids.   You may resume your normal diet as instructed by your doctor.   Begin with a light meal and progress to your normal diet. Heavy or fried foods are harder to digest and may make you feel sick to your stomach (nauseated).   Avoid alcoholic beverages for 24 hours or as instructed.  MEDICATIONS  You may resume your normal medications unless your doctor tells you otherwise.  WHAT YOU CAN EXPECT TODAY  Some feelings of bloating in the abdomen.   Passage of more gas than usual.   Spotting of blood in your stool or on the toilet paper.  IF YOU HAD POLYPS REMOVED DURING THE COLONOSCOPY:  No aspirin products for 7 days or as instructed.   No alcohol for 7 days or as instructed.   Eat a soft diet for the next 24 hours.  FINDING OUT THE RESULTS OF YOUR TEST Not all test results are available during your visit. If your test results are not back during the visit, make an appointment  with your caregiver to find out the results. Do not assume everything is normal if you have not heard from your caregiver or the medical facility. It is important for you to follow up on all of your test results.  SEEK IMMEDIATE MEDICAL ATTENTION IF:  You have more than a spotting of blood in your stool.   Your belly is swollen (abdominal distention).   You are nauseated or vomiting.   You have a temperature over 101.   You have abdominal pain or discomfort that is severe or gets worse throughout the day.    Repeat colonoscopy for screening purposes in 10 years  GERD and peptic ulcer disease information provided  Stop taking ibuprofen and all forms of NSAIDs.  Crease Protonix to 40 mg twice daily  Office visit with [place] in 3 months  Further recommendations to follow pending review of pathology report   Peptic Ulcer  A peptic ulcer is a painful sore in the lining of your stomach or the first part of your small intestine. What are the causes? Common causes of this condition include:  An infection.  Using certain pain medicines too often or too much. What increases the risk? You are more likely to get this condition if you:  Smoke.  Have a family history of ulcer disease.  Drink alcohol.  Have been hospitalized in an intensive care unit (ICU). What are the signs or symptoms? Symptoms include:  Burning pain in the  area between the chest and the belly button. The pain may: ? Not go away (be persistent). ? Be worse when your stomach is empty. ? Be worse at night.  Heartburn.  Feeling sick to your stomach (nauseous) and throwing up (vomiting).  Bloating. If the ulcer results in bleeding, it can cause you to:  Have poop (stool) that is black and looks like tar.  Throw up bright red blood.  Throw up material that looks like coffee grounds. How is this treated? Treatment for this condition may include:  Stopping things that can cause the ulcer, such  as: ? Smoking. ? Using pain medicines.  Medicines to reduce stomach acid.  Antibiotic medicines if the ulcer is caused by an infection.  A procedure that is done using a small, flexible tube that has a camera at the end (upper endoscopy). This may be done if you have a bleeding ulcer.  Surgery. This may be needed if: ? You have a lot of bleeding. ? The ulcer caused a hole somewhere in the digestive system. Follow these instructions at home:  Do not drink alcohol if your doctor tells you not to drink.  Limit how much caffeine you take in.  Do not use any products that contain nicotine or tobacco, such as cigarettes, e-cigarettes, and chewing tobacco. If you need help quitting, ask your doctor.  Take over-the-counter and prescription medicines only as told by your doctor. ? Do not stop or change your medicines unless you talk with your doctor about it first. ? Do not take aspirin, ibuprofen, or other NSAIDs unless your doctor told you to do so.  Keep all follow-up visits as told by your doctor. This is important. Contact a doctor if:  You do not get better in 7 days after you start treatment.  You keep having an upset stomach (indigestion) or heartburn. Get help right away if:  You have sudden, sharp pain in your belly (abdomen).  You have belly pain that does not go away.  You have bloody poop (stool) or black, tarry poop.  You throw up blood. It may look like coffee grounds.  You feel light-headed or feel like you may pass out (faint).  You get weak.  You get sweaty or feel sticky and cold to the touch (clammy). Summary  Symptoms of a peptic ulcer include burning pain in the area between the chest and the belly button.  Take medicines only as told by your doctor.  Limit how much alcohol and caffeine you have.  Keep all follow-up visits as told by your doctor. This information is not intended to replace advice given to you by your health care provider. Make sure  you discuss any questions you have with your health care provider. Document Released: 05/02/2009 Document Revised: 08/13/2017 Document Reviewed: 08/13/2017 Elsevier Interactive Patient Education  2019 [employer]  Gastroesophageal Reflux Disease, Adult Gastroesophageal reflux (GER) happens when acid from the stomach flows up into the tube that connects the mouth and the stomach (esophagus). Normally, food travels down the esophagus and stays in the stomach to be digested. However, when a person has GER, food and stomach acid sometimes move back up into the esophagus. If this becomes a more serious problem, the person may be diagnosed with a disease called gastroesophageal reflux disease (GERD). GERD occurs when the reflux:  Happens often.  Causes frequent or severe symptoms.  Causes problems such as damage to the esophagus. When stomach acid comes in contact with the esophagus, the  acid may cause soreness (inflammation) in the esophagus. Over time, GERD may create small holes (ulcers) in the lining of the esophagus. What are the causes? This condition is caused by a problem with the muscle between the esophagus and the stomach (lower esophageal sphincter, or LES). Normally, the LES muscle closes after food passes through the esophagus to the stomach. When the LES is weakened or abnormal, it does not close properly, and that allows food and stomach acid to go back up into the esophagus. The LES can be weakened by certain dietary substances, medicines, and medical conditions, including:  Tobacco use.  Pregnancy.  Having a hiatal hernia.  Alcohol use.  Certain foods and beverages, such as coffee, chocolate, onions, and peppermint. What increases the risk? You are more likely to develop this condition if you:  Have an increased body weight.  Have a connective tissue disorder.  Use NSAID medicines. What are the signs or symptoms? Symptoms of this condition  include:  Heartburn.  Difficult or painful swallowing.  The feeling of having a lump in the throat.  Abitter taste in the mouth.  Bad breath.  Having a large amount of saliva.  Having an upset or bloated stomach.  Belching.  Chest pain. Different conditions can cause chest pain. Make sure you see your health care provider if you experience chest pain.  Shortness of breath or wheezing.  Ongoing (chronic) cough or a night-time cough.  Wearing away of tooth enamel.  Weight loss. How is this diagnosed? Your health care provider will take a medical history and perform a physical exam. To determine if you have mild or severe GERD, your health care provider may also monitor how you respond to treatment. You may also have tests, including:  A test to examine your stomach and esophagus with a small camera (endoscopy).  A test thatmeasures the acidity level in your esophagus.  A test thatmeasures how much pressure is on your esophagus.  A barium swallow or modified barium swallow test to show the shape, size, and functioning of your esophagus. How is this treated? The goal of treatment is to help relieve your symptoms and to prevent complications. Treatment for this condition may vary depending on how severe your symptoms are. Your health care provider may recommend:  Changes to your diet.  Medicine.  Surgery. Follow these instructions at home: Eating and drinking   Follow a diet as recommended by your health care provider. This may involve avoiding foods and drinks such as: ? Coffee and tea (with or without caffeine). ? Drinks that containalcohol. ? Energy drinks and sports drinks. ? Carbonated drinks or sodas. ? Chocolate and cocoa. ? Peppermint and mint flavorings. ? Garlic and onions. ? Horseradish. ? Spicy and acidic foods, including peppers, chili powder, curry powder, vinegar, hot sauces, and barbecue sauce. ? Citrus fruit juices and citrus fruits, such as  oranges, lemons, and limes. ? Tomato-based foods, such as red sauce, chili, salsa, and pizza with red sauce. ? Fried and fatty foods, such as donuts, french fries, potato chips, and high-fat dressings. ? High-fat meats, such as hot dogs and fatty cuts of red and white meats, such as rib eye steak, sausage, ham, and bacon. ? High-fat dairy items, such as whole milk, butter, and cream cheese.  Eat small, frequent meals instead of large meals.  Avoid drinking large amounts of liquid with your meals.  Avoid eating meals during the 2-3 hours before bedtime.  Avoid lying down right after you eat.  Do not exercise right after you eat. Lifestyle   Do not use any products that contain nicotine or tobacco, such as cigarettes, e-cigarettes, and chewing tobacco. If you need help quitting, ask your health care provider.  Try to reduce your stress by using methods such as yoga or meditation. If you need help reducing stress, ask your health care provider.  If you are overweight, reduce your weight to an amount that is healthy for you. Ask your health care provider for guidance about a safe weight loss goal. General instructions  Pay attention to any changes in your symptoms.  Take over-the-counter and prescription medicines only as told by your health care provider. Do not take aspirin, ibuprofen, or other NSAIDs unless your health care provider told you to do so.  Wear loose-fitting clothing. Do not wear anything tight around your waist that causes pressure on your abdomen.  Raise (elevate) the head of your bed about 6 inches (15 cm).  Avoid bending over if this makes your symptoms worse.  Keep all follow-up visits as told by your health care provider. This is important. Contact a health care provider if:  You have: ? New symptoms. ? Unexplained weight loss. ? Difficulty swallowing or it hurts to swallow. ? Wheezing or a persistent cough. ? A hoarse voice.  Your symptoms do not  improve with treatment. Get help right away if you:  Have pain in your arms, neck, jaw, teeth, or back.  Feel sweaty, dizzy, or light-headed.  Have chest pain or shortness of breath.  Vomit and your vomit looks like blood or coffee grounds.  Faint.  Have stool that is bloody or black.  Cannot swallow, drink, or eat. Summary  Gastroesophageal reflux happens when acid from the stomach flows up into the esophagus. GERD is a disease in which the reflux happens often, causes frequent or severe symptoms, or causes problems such as damage to the esophagus.  Treatment for this condition may vary depending on how severe your symptoms are. Your health care provider may recommend diet and lifestyle changes, medicine, or surgery.  Contact a health care provider if you have new or worsening symptoms.  Take over-the-counter and prescription medicines only as told by your health care provider. Do not take aspirin, ibuprofen, or other NSAIDs unless your health care provider told you to do so.  Keep all follow-up visits as told by your health care provider. This is important. This information is not intended to replace advice given to you by your health care provider. Make sure you discuss any questions you have with your health care provider. Document Released: 11/15/2004 Document Revised: 08/14/2017 Document Reviewed: 08/14/2017 Elsevier Interactive Patient Education  2019 [employer].

## 2018-03-27 ENCOUNTER — Encounter: Payer: Self-pay | Admitting: Internal Medicine

## 2018-03-31 ENCOUNTER — Telehealth: Payer: Self-pay | Admitting: Internal Medicine

## 2018-03-31 NOTE — Telephone Encounter (Signed)
RMR, please advise on if this is ok to write letter for the 6th, 7th and 8th of last week.

## 2018-03-31 NOTE — Telephone Encounter (Signed)
Pt had procedure done by RMR on 2/5. She is complaining of her back and throat hurting and needs a doctor's excuse for the 6th, 7th and 8th. Please call  Her at [phone]

## 2018-03-31 NOTE — Telephone Encounter (Signed)
Noted. Letter is ready for pickup, pt is aware.

## 2018-03-31 NOTE — Telephone Encounter (Signed)
Note okay for the sixth, seventh and eighth.  If she has any ongoing problems she needs to let us know.

## 2018-04-02 ENCOUNTER — Encounter (HOSPITAL_COMMUNITY): Payer: Self-pay | Admitting: Internal Medicine

## 2018-04-30 ENCOUNTER — Other Ambulatory Visit (HOSPITAL_COMMUNITY)
Admission: RE | Admit: 2018-04-30 | Discharge: 2018-04-30 | Disposition: A | Discharge status: Home / Self Care | Payer: BLUE CROSS/BLUE SHIELD | Source: Ambulatory Visit | Attending: Pulmonary Disease | Admitting: Pulmonary Disease

## 2018-04-30 DIAGNOSIS — R634 Abnormal weight loss: Secondary | ICD-10-CM | POA: Insufficient documentation

## 2018-04-30 LAB — COMPREHENSIVE METABOLIC PANEL
ALT: 27 U/L (ref 0–44)
AST: 39 U/L (ref 15–41)
Albumin: 3.4 g/dL — ABNORMAL LOW (ref 3.5–5.0)
Alkaline Phosphatase: 97 U/L (ref 38–126)
Anion gap: 9 (ref 5–15)
BILIRUBIN TOTAL: 0.3 mg/dL (ref 0.3–1.2)
BUN: 10 mg/dL (ref 8–23)
CO2: 26 mmol/L (ref 22–32)
Calcium: 8.5 mg/dL — ABNORMAL LOW (ref 8.9–10.3)
Chloride: 108 mmol/L (ref 98–111)
Creatinine, Ser: 0.56 mg/dL (ref 0.44–1.00)
GFR calc non Af Amer: >60 mL/min (ref >60)
Glucose, Bld: 91 mg/dL (ref 70–99)
Potassium: 3.8 mmol/L (ref 3.5–5.1)
Sodium: 143 mmol/L (ref 135–145)
Total Protein: 6.7 g/dL (ref 6.5–8.1)

## 2018-04-30 LAB — TSH: TSH: 1.277 u[IU]/mL (ref 0.350–4.500)

## 2018-04-30 LAB — CBC WITH DIFFERENTIAL/PLATELET
Abs Immature Granulocytes: 0 10*3/uL (ref 0.00–0.07)
BASOS PCT: 1 %
Basophils Absolute: 0 10*3/uL (ref 0.0–0.1)
Eosinophils Absolute: 0.2 10*3/uL (ref 0.0–0.5)
Eosinophils Relative: 5 %
HCT: 41.2 % (ref 36.0–46.0)
Hemoglobin: 13 g/dL (ref 12.0–15.0)
Immature Granulocytes: 0 %
Lymphocytes Relative: 49 %
Lymphs Abs: 1.5 10*3/uL (ref 0.7–4.0)
MCH: 31.8 pg (ref 26.0–34.0)
MCHC: 31.6 g/dL (ref 30.0–36.0)
MCV: 100.7 fL — ABNORMAL HIGH (ref 80.0–100.0)
Monocytes Absolute: 0.5 10*3/uL (ref 0.1–1.0)
Monocytes Relative: 16 %
NRBC: 0 % (ref 0.0–0.2)
Neutro Abs: 0.9 10*3/uL — ABNORMAL LOW (ref 1.7–7.7)
Neutrophils Relative %: 29 %
Platelets: 359 10*3/uL (ref 150–400)
RBC: 4.09 MIL/uL (ref 3.87–5.11)
RDW: 14.1 % (ref 11.5–15.5)
WBC: 3.1 10*3/uL — AB (ref 4.0–10.5)

## 2018-05-26 ENCOUNTER — Ambulatory Visit (INDEPENDENT_AMBULATORY_CARE_PROVIDER_SITE_OTHER): Payer: BLUE CROSS/BLUE SHIELD | Admitting: Gastroenterology

## 2018-05-26 ENCOUNTER — Encounter: Payer: Self-pay | Admitting: Gastroenterology

## 2018-05-26 ENCOUNTER — Other Ambulatory Visit: Payer: Self-pay

## 2018-05-26 ENCOUNTER — Telehealth: Payer: Self-pay | Admitting: *Deleted

## 2018-05-26 DIAGNOSIS — K259 Gastric ulcer, unspecified as acute or chronic, without hemorrhage or perforation: Secondary | ICD-10-CM | POA: Insufficient documentation

## 2018-05-26 DIAGNOSIS — R1319 Other dysphagia: Secondary | ICD-10-CM

## 2018-05-26 DIAGNOSIS — R1310 Dysphagia, unspecified: Secondary | ICD-10-CM

## 2018-05-26 DIAGNOSIS — K253 Acute gastric ulcer without hemorrhage or perforation: Secondary | ICD-10-CM

## 2018-05-26 NOTE — Patient Instructions (Signed)
1. Continue pantoprazole 40 mg daily before breakfast. 2. Please avoid aspirin powders such as BC or Goody powders because they can cause ulcers. 3. Upper endoscopy to be scheduled in the near future.

## 2018-05-26 NOTE — Telephone Encounter (Signed)
lmovm to schedule EGD/ED with [facility]MR in June/July 2020 per instructions.

## 2018-05-26 NOTE — Progress Notes (Signed)
Primary Care Physician:  [facility] Primary GI:  Roetta Sessions, MD   Patient Location: home  Provider Location: [facility] office  Reason for Phone Visit: schedule EGD  Persons present on the phone encounter, with roles: patient, myself (provider), Sandria Senter LPN (update meds/allergies)  Total time (minutes) spent on medical discussion: 7 minutes  Due to COVID-19, visit was conducted using telephonic method (no video was available).  Visit was requested by patient.  Virtual Visit via Telephone only  I connected with@ on 05/26/18 at 11:00 AM EDT by telephone and verified that I am speaking with the correct person using two identifiers.   I discussed the limitations, risks, security and privacy concerns of performing an evaluation and management service by telephone and the availability of in person appointments. I also discussed with the patient that there may be a patient responsible charge related to this service. The patient expressed understanding and agreed to proceed.   HPI:   Cassandra Montoya is a 63 y.o. female who presents for telephone visit regarding: gastric ulcer/dysphagia, schedule EGD.   Seen back in 01/2018 for dysphagia, weight loss, TCS. 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   She had EGD/TCS 03/2018. She had reflux esophagitis, schatzki ring s/p dilation, gastric ulcer with benign biopsy. Colonoscopy normal except small cecal AVM. Next EGD 3 months and next TCS 10 years.   She has had some sinus drainage and dry mouth. Feels like something stuck in throat all the time. Always crush large pills. Eats soups, creamed potatoes, cooked veggies, cubed steak. Has to cut up really fine. No heartburn. No vomiting. No abdominal pain. BM few times per week. No hard stool. Small soft stools. No melena. No brbpr. Not sure about any further weight loss.    Current Outpatient  Medications  Medication Sig Dispense Refill  . albuterol (PROVENTIL HFA;VENTOLIN HFA) 108 (90 Base) MCG/ACT inhaler Inhale 1-2 puffs into the lungs every 6 (six) hours as needed for wheezing or shortness of breath.    Marland Kitchen albuterol (PROVENTIL) (2.5 MG/3ML) 0.083% nebulizer solution Take 3 mLs (2.5 mg total) by nebulization every 4 (four) hours as needed for wheezing or shortness of breath. 75 mL 12  . aspirin EC 81 MG tablet Take 81 mg by mouth daily.    . Aspirin-Salicylamide-Caffeine (BC HEADACHE POWDER PO) Take by mouth as needed.     Marland Kitchen BREO ELLIPTA 200-25 MCG/INH AEPB Inhale 1 puff into the lungs daily.    . feeding supplement, ENSURE ENLIVE, (ENSURE ENLIVE) LIQD Take 237 mLs by mouth 2 (two) times daily between meals. (Patient taking differently: Take 237 mLs by mouth 2 (two) times a week. ) 237 mL 12  . Fluticasone-Umeclidin-Vilant (TRELEGY ELLIPTA) 100-62.5-25 MCG/INH AEPB Inhale into the lungs.    . INCRUSE ELLIPTA 62.5 MCG/INH AEPB Inhale 1 puff into the lungs daily.    . pantoprazole (PROTONIX) 40 MG tablet Take 1 tablet (40 mg total) by mouth daily before breakfast. 30 tablet 5   No current facility-administered medications for this visit.     Past Medical History:  Diagnosis Date  . Asthma   . COPD (chronic obstructive pulmonary disease) (HCC)   . Ovarian cyst    Right ovarian cyst,   . Pneumonia     Past Surgical History:  Procedure Laterality Date  . BIOPSY  03/26/2018   Procedure: BIOPSY;  Surgeon: Corbin Ade, MD;  Location: [facility];  Service: Endoscopy;;  gastric  . COLONOSCOPY N/A 03/26/2018   Dr. 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  10-year screening colonoscopy  . ESOPHAGOGASTRODUODENOSCOPY N/A 03/26/2018   Dr. Lovena Neighbours: Esophagitis, mild Schatzki ring, gastric ulcer with multiple satellite erosions.  Esophagus was dilated.  Gastric biopsies were benign.  No H. pylori.  Recommend 7-month surveillance EGD.  Marland Kitchen MALONEY DILATION N/A 03/26/2018   Procedure:  Elease Hashimoto DILATION;  Surgeon: Corbin Ade, MD;  Location: [facility];  Service: Endoscopy;  Laterality: N/A;    Family History  Problem Relation Age of Onset  . Stroke Brother   . Hypertension Mother   . Colon cancer Neg Hx     Social History   Socioeconomic History  . Marital status: Widowed    Spouse name: Not on file  . Number of children: Not on file  . Years of education: Not on file  . Highest education level: Not on file  Occupational History  . Not on file  Social Needs  . Financial resource strain: Not on file  . Food insecurity:    Worry: Not on file    Inability: Not on file  . Transportation needs:    Medical: Not on file    Non-medical: Not on file  Tobacco Use  . Smoking status: Current Every Day Smoker    Packs/day: 0.50    Types: Cigarettes  . Smokeless tobacco: Never Used  Substance and Sexual Activity  . Alcohol use: No  . Drug use: No  . Sexual activity: Not on file  Lifestyle  . Physical activity:    Days per week: Not on file    Minutes per session: Not on file  . Stress: Not on file  Relationships  . Social connections:    Talks on phone: Not on file    Gets together: Not on file    Attends religious service: Not on file    Active member of club or organization: Not on file    Attends meetings of clubs or organizations: Not on file    Relationship status: Not on file  . Intimate partner violence:    Fear of current or ex partner: Not on file    Emotionally abused: Not on file    Physically abused: Not on file    Forced sexual activity: Not on file  Other Topics Concern  . Not on file  Social History Narrative  . Not on file      ROS:  General: Negative for anorexia, weight loss, fever, chills, fatigue, weakness. Eyes: Negative for vision changes.  ENT: Negative for hoarseness, difficulty swallowing , nasal congestion. CV: Negative for chest pain, angina, palpitations, dyspnea on exertion, peripheral edema.  Respiratory:  Negative for dyspnea at rest, dyspnea on exertion, cough, sputum, wheezing.  GI: See history of present illness. GU:  Negative for dysuria, hematuria, urinary incontinence, urinary frequency, nocturnal urination.  MS: Negative for joint pain, low back pain.  Derm: Negative for rash or itching.  Neuro: Negative for weakness, abnormal sensation, seizure, frequent headaches, memory loss, confusion.  Psych: Negative for anxiety, depression, suicidal ideation, hallucinations.  Endo: Negative for unusual weight change.  Heme: Negative for bruising or bleeding. Allergy: Negative for rash or hives.   Observations/Objective: No distress. Patient is unsure of weight. PE unavailable.   Lab Results  Component Value Date   WBC 3.1 (L) 04/30/2018   HGB 13.0  04/30/2018   HCT 41.2 04/30/2018   MCV 100.7 (H) 04/30/2018   PLT 359 04/30/2018   Lab Results  Component Value Date   CREATININE 0.56 04/30/2018   BUN 10 04/30/2018   NA 143 04/30/2018   K 3.8 04/30/2018   CL 108 04/30/2018   CO2 26 04/30/2018   Lab Results  Component Value Date   ALT 27 04/30/2018   AST 39 04/30/2018   ALKPHOS 97 04/30/2018   BILITOT 0.3 04/30/2018   No results found for: IRON, TIBC, FERRITIN No results found for: VITAMINB12 No results found for: FOLATE   Assessment and Plan: 63 y/o female with h/o weight loss, erosive reflux esophagtitis, esophageal dysphagia, PUD presenting for follow up. She is due surveillance EGD in one month to verify gastric ulcer healing. She has persistent esophageal dysphagia with known Schatzki ring. May require further dilation. Unable to known if ongoing weight loss given inability to measure weight. Patient believes her weight is stable.   Plan for EGD/ED in near future.  I have discussed the risks, alternatives, benefits with regards to but not limited to the risk of reaction to medication, bleeding, infection, perforation and the patient is agreeable to proceed. Written consent to  be obtained.  Continue pantoprazole 40mg  daily. Avoid ASA powders.   Follow Up Instructions:    I discussed the assessment and treatment plan with the patient. The patient was provided an opportunity to ask questions and all were answered. The patient agreed with the plan and demonstrated an understanding of the instructions. AVS mailed to patient's home address.   The patient was advised to call back or seek an in-person evaluation if the symptoms worsen or if the condition fails to improve as anticipated.  I provided 7 minutes of non-face-to-face time during this encounter.   Tana Coast, PA-C

## 2018-05-27 ENCOUNTER — Other Ambulatory Visit: Payer: Self-pay | Admitting: *Deleted

## 2018-05-27 DIAGNOSIS — R1319 Other dysphagia: Secondary | ICD-10-CM

## 2018-05-27 DIAGNOSIS — R1310 Dysphagia, unspecified: Secondary | ICD-10-CM

## 2018-05-27 DIAGNOSIS — K253 Acute gastric ulcer without hemorrhage or perforation: Secondary | ICD-10-CM

## 2018-05-27 NOTE — Telephone Encounter (Signed)
Spoke with pt and she is scheduled for EGD/ED with [facility]MR on 6/24 at 12:15pm. Orders entered and letter mailed.

## 2018-05-27 NOTE — Progress Notes (Signed)
CC'ED TO PCP 

## 2018-05-27 NOTE — Telephone Encounter (Signed)
called pt, VM is now full

## 2018-06-24 ENCOUNTER — Ambulatory Visit: Payer: BLUE CROSS/BLUE SHIELD | Admitting: Gastroenterology

## 2018-08-04 ENCOUNTER — Telehealth: Payer: Self-pay | Admitting: *Deleted

## 2018-08-04 NOTE — Telephone Encounter (Signed)
Left message with pt's sister for her to call us back.  Unable to reach pt on her phone number due to calling restrictions.

## 2018-08-05 ENCOUNTER — Telehealth: Payer: Self-pay | Admitting: *Deleted

## 2018-08-05 NOTE — Telephone Encounter (Addendum)
Pt is scheduled for her COVID 19 screening on 08/08/2018.  Pt is aware to remain in quarantine once testing is done.  Pt said that her insurance termed on 07/12/2018 but it is supposed to be re-instated.  Pt advised to call her insurance company to make sure it is done before her upcoming testing and procedure.  Otherwise, she is aware that she may be responsible for the bill.  Pt voiced understanding.

## 2018-08-08 ENCOUNTER — Other Ambulatory Visit: Payer: Self-pay

## 2018-08-08 ENCOUNTER — Other Ambulatory Visit (HOSPITAL_COMMUNITY)
Admission: RE | Admit: 2018-08-08 | Discharge: 2018-08-08 | Disposition: A | Discharge status: Home / Self Care | Payer: HRSA Program | Source: Ambulatory Visit | Attending: Internal Medicine | Admitting: Internal Medicine

## 2018-08-08 DIAGNOSIS — Z1159 Encounter for screening for other viral diseases: Secondary | ICD-10-CM | POA: Insufficient documentation

## 2018-08-10 LAB — NOVEL CORONAVIRUS, NAA (HOSP ORDER, SEND-OUT TO REF LAB; TAT 18-24 HRS): SARS-CoV-2, NAA: NOT DETECTED

## 2018-08-13 ENCOUNTER — Encounter (HOSPITAL_COMMUNITY)
Admission: RE | Disposition: A | Discharge status: Home / Self Care | Payer: Self-pay | Source: Home / Self Care | Attending: Internal Medicine

## 2018-08-13 ENCOUNTER — Ambulatory Visit (HOSPITAL_COMMUNITY)
Admission: RE | Admit: 2018-08-13 | Discharge: 2018-08-13 | Disposition: A | Discharge status: Home / Self Care | Payer: BC Managed Care – PPO | Attending: Internal Medicine | Admitting: Internal Medicine

## 2018-08-13 ENCOUNTER — Other Ambulatory Visit: Payer: Self-pay

## 2018-08-13 ENCOUNTER — Encounter (HOSPITAL_COMMUNITY): Payer: Self-pay

## 2018-08-13 DIAGNOSIS — K222 Esophageal obstruction: Secondary | ICD-10-CM | POA: Insufficient documentation

## 2018-08-13 DIAGNOSIS — F17210 Nicotine dependence, cigarettes, uncomplicated: Secondary | ICD-10-CM | POA: Diagnosis not present

## 2018-08-13 DIAGNOSIS — Z7982 Long term (current) use of aspirin: Secondary | ICD-10-CM | POA: Insufficient documentation

## 2018-08-13 DIAGNOSIS — J449 Chronic obstructive pulmonary disease, unspecified: Secondary | ICD-10-CM | POA: Diagnosis not present

## 2018-08-13 DIAGNOSIS — K319 Disease of stomach and duodenum, unspecified: Secondary | ICD-10-CM | POA: Insufficient documentation

## 2018-08-13 DIAGNOSIS — R1319 Other dysphagia: Secondary | ICD-10-CM

## 2018-08-13 DIAGNOSIS — R1310 Dysphagia, unspecified: Secondary | ICD-10-CM

## 2018-08-13 DIAGNOSIS — K209 Esophagitis, unspecified: Secondary | ICD-10-CM

## 2018-08-13 DIAGNOSIS — K253 Acute gastric ulcer without hemorrhage or perforation: Secondary | ICD-10-CM

## 2018-08-13 DIAGNOSIS — K210 Gastro-esophageal reflux disease with esophagitis: Secondary | ICD-10-CM | POA: Insufficient documentation

## 2018-08-13 DIAGNOSIS — K259 Gastric ulcer, unspecified as acute or chronic, without hemorrhage or perforation: Secondary | ICD-10-CM | POA: Diagnosis not present

## 2018-08-13 HISTORY — PX: BIOPSY: SHX5522

## 2018-08-13 HISTORY — PX: ESOPHAGOGASTRODUODENOSCOPY: SHX5428

## 2018-08-13 HISTORY — PX: MALONEY DILATION: SHX5535

## 2018-08-13 SURGERY — EGD (ESOPHAGOGASTRODUODENOSCOPY)
Anesthesia: Moderate Sedation

## 2018-08-13 MED ORDER — MIDAZOLAM HCL 5 MG/5ML IJ SOLN
INTRAMUSCULAR | Status: DC | PRN
  Administered 2018-08-13 (×2): 1 mg via INTRAVENOUS
  Administered 2018-08-13: 2 mg via INTRAVENOUS

## 2018-08-13 MED ORDER — MEPERIDINE HCL 50 MG/ML IJ SOLN
INTRAMUSCULAR | Status: AC
  Filled 2018-08-13: qty 1

## 2018-08-13 MED ORDER — STERILE WATER FOR IRRIGATION IR SOLN
Status: DC | PRN
  Administered 2018-08-13: 2.5 mL

## 2018-08-13 MED ORDER — SODIUM CHLORIDE 0.9 % IV SOLN
INTRAVENOUS | Status: DC
  Administered 2018-08-13: 12:00:00 via INTRAVENOUS

## 2018-08-13 MED ORDER — MIDAZOLAM HCL 5 MG/5ML IJ SOLN
INTRAMUSCULAR | Status: AC
  Filled 2018-08-13: qty 10

## 2018-08-13 MED ORDER — ONDANSETRON HCL 4 MG/2ML IJ SOLN
INTRAMUSCULAR | Status: AC
  Filled 2018-08-13: qty 2

## 2018-08-13 MED ORDER — ONDANSETRON HCL 4 MG/2ML IJ SOLN
INTRAMUSCULAR | Status: DC | PRN
  Administered 2018-08-13: 4 mg via INTRAVENOUS

## 2018-08-13 MED ORDER — LIDOCAINE VISCOUS HCL 2 % MT SOLN
OROMUCOSAL | Status: DC | PRN
  Administered 2018-08-13: 4 mL via OROMUCOSAL

## 2018-08-13 MED ORDER — LIDOCAINE VISCOUS HCL 2 % MT SOLN
OROMUCOSAL | Status: AC
  Filled 2018-08-13: qty 15

## 2018-08-13 MED ORDER — MEPERIDINE HCL 100 MG/ML IJ SOLN
INTRAMUSCULAR | Status: DC | PRN
  Administered 2018-08-13: 15 mg via INTRAVENOUS
  Administered 2018-08-13: 25 mg via INTRAVENOUS

## 2018-08-13 NOTE — Discharge Instructions (Signed)
EGD Discharge instructions Please read the instructions outlined below and refer to this sheet in the next few weeks. These discharge instructions provide you with general information on caring for yourself after you leave the hospital. Your doctor may also give you specific instructions. While your treatment has been planned according to the most current medical practices available, unavoidable complications occasionally occur. If you have any problems or questions after discharge, please call your doctor. ACTIVITY  You may resume your regular activity but move at a slower pace for the next 24 hours.   Take frequent rest periods for the next 24 hours.   Walking will help expel (get rid of) the air and reduce the bloated feeling in your abdomen.   No driving for 24 hours (because of the anesthesia (medicine) used during the test).   You may shower.   Do not sign any important legal documents or operate any machinery for 24 hours (because of the anesthesia used during the test).  NUTRITION  Drink plenty of fluids.   You may resume your normal diet.   Begin with a light meal and progress to your normal diet.   Avoid alcoholic beverages for 24 hours or as instructed by your caregiver.  MEDICATIONS  You may resume your normal medications unless your caregiver tells you otherwise.  WHAT YOU CAN EXPECT TODAY  You may experience abdominal discomfort such as a feeling of fullness or gas pains.  FOLLOW-UP  Your doctor will discuss the results of your test with you.  SEEK IMMEDIATE MEDICAL ATTENTION IF ANY OF THE FOLLOWING OCCUR:  Excessive nausea (feeling sick to your stomach) and/or vomiting.   Severe abdominal pain and distention (swelling).   Trouble swallowing.   Temperature over 101 F (37.8 C).   Rectal bleeding or vomiting of blood.    Gastroesophageal Reflux Disease, Adult Gastroesophageal reflux (GER) happens when acid from the stomach flows up into the tube that  connects the mouth and the stomach (esophagus). Normally, food travels down the esophagus and stays in the stomach to be digested. With GER, food and stomach acid sometimes move back up into the esophagus. You may have a disease called gastroesophageal reflux disease (GERD) if the reflux:  Happens often.  Causes frequent or very bad symptoms.  Causes problems such as damage to the esophagus. When this happens, the esophagus becomes sore and swollen (inflamed). Over time, GERD can make small holes (ulcers) in the lining of the esophagus. What are the causes? This condition is caused by a problem with the muscle between the esophagus and the stomach. When this muscle is weak or not normal, it does not close properly to keep food and acid from coming back up from the stomach. The muscle can be weak because of:  Tobacco use.  Pregnancy.  Having a certain type of hernia (hiatal hernia).  Alcohol use.  Certain foods and drinks, such as coffee, chocolate, onions, and peppermint. What increases the risk? You are more likely to develop this condition if you:  Are overweight.  Have a disease that affects your connective tissue.  Use NSAID medicines. What are the signs or symptoms? Symptoms of this condition include:  Heartburn.  Difficult or painful swallowing.  The feeling of having a lump in the throat.  A bitter taste in the mouth.  Bad breath.  Having a lot of saliva.  Having an upset or bloated stomach.  Belching.  Chest pain. Different conditions can cause chest pain. Make sure you see  your doctor if you have chest pain.  Shortness of breath or noisy breathing (wheezing).  Ongoing (chronic) cough or a cough at night.  Wearing away of the surface of teeth (tooth enamel).  Weight loss. How is this treated? Treatment will depend on how bad your symptoms are. Your doctor may suggest:  Changes to your diet.  Medicine.  Surgery. Follow these instructions at  home: Eating and drinking   Follow a diet as told by your doctor. You may need to avoid foods and drinks such as: ? Coffee and tea (with or without caffeine). ? Drinks that contain alcohol. ? Energy drinks and sports drinks. ? Bubbly (carbonated) drinks or sodas. ? Chocolate and cocoa. ? Peppermint and mint flavorings. ? Garlic and onions. ? Horseradish. ? Spicy and acidic foods. These include peppers, chili powder, curry powder, vinegar, hot sauces, and BBQ sauce. ? Citrus fruit juices and citrus fruits, such as oranges, lemons, and limes. ? Tomato-based foods. These include red sauce, chili, salsa, and pizza with red sauce. ? Fried and fatty foods. These include donuts, french fries, potato chips, and high-fat dressings. ? High-fat meats. These include hot dogs, rib eye steak, sausage, ham, and bacon. ? High-fat dairy items, such as whole milk, butter, and cream cheese.  Eat small meals often. Avoid eating large meals.  Avoid drinking large amounts of liquid with your meals.  Avoid eating meals during the 2-3 hours before bedtime.  Avoid lying down right after you eat.  Do not exercise right after you eat. Lifestyle   Do not use any products that contain nicotine or tobacco. These include cigarettes, e-cigarettes, and chewing tobacco. If you need help quitting, ask your doctor.  Try to lower your stress. If you need help doing this, ask your doctor.  If you are overweight, lose an amount of weight that is healthy for you. Ask your doctor about a safe weight loss goal. General instructions  Pay attention to any changes in your symptoms.  Take over-the-counter and prescription medicines only as told by your doctor. Do not take aspirin, ibuprofen, or other NSAIDs unless your doctor says it is okay.  Wear loose clothes. Do not wear anything tight around your waist.  Raise (elevate) the head of your bed about 6 inches (15 cm).  Avoid bending over if this makes your  symptoms worse.  Keep all follow-up visits as told by your doctor. This is important. Contact a doctor if:  You have new symptoms.  You lose weight and you do not know why.  You have trouble swallowing or it hurts to swallow.  You have wheezing or a cough that keeps happening.  Your symptoms do not get better with treatment.  You have a hoarse voice. Get help right away if:  You have pain in your arms, neck, jaw, teeth, or back.  You feel sweaty, dizzy, or light-headed.  You have chest pain or shortness of breath.  You throw up (vomit) and your throw-up looks like blood or coffee grounds.  You pass out (faint).  Your poop (stool) is bloody or black.  You cannot swallow, drink, or eat. Summary  If a person has gastroesophageal reflux disease (GERD), food and stomach acid move back up into the esophagus and cause symptoms or problems such as damage to the esophagus.  Treatment will depend on how bad your symptoms are.  Follow a diet as told by your doctor.  Take all medicines only as told by your doctor. This information  is not intended to replace advice given to you by your health care provider. Make sure you discuss any questions you have with your health care provider. Document Released: 07/25/2007 Document Revised: 08/14/2017 Document Reviewed: 08/14/2017 Elsevier Interactive Patient Education  2019 [employer]  Peptic Ulcer  A peptic ulcer is a painful sore in the lining of your stomach or the first part of your small intestine. What are the causes? Common causes of this condition include:  An infection.  Using certain pain medicines too often or too much. What increases the risk? You are more likely to get this condition if you:  Smoke.  Have a family history of ulcer disease.  Drink alcohol.  Have been hospitalized in an intensive care unit (ICU). What are the signs or symptoms? Symptoms include:  Burning pain in the area between the chest and  the belly button. The pain may: ? Not go away (be persistent). ? Be worse when your stomach is empty. ? Be worse at night.  Heartburn.  Feeling sick to your stomach (nauseous) and throwing up (vomiting).  Bloating. If the ulcer results in bleeding, it can cause you to:  Have poop (stool) that is black and looks like tar.  Throw up bright red blood.  Throw up material that looks like coffee grounds. How is this treated? Treatment for this condition may include:  Stopping things that can cause the ulcer, such as: ? Smoking. ? Using pain medicines.  Medicines to reduce stomach acid.  Antibiotic medicines if the ulcer is caused by an infection.  A procedure that is done using a small, flexible tube that has a camera at the end (upper endoscopy). This may be done if you have a bleeding ulcer.  Surgery. This may be needed if: ? You have a lot of bleeding. ? The ulcer caused a hole somewhere in the digestive system. Follow these instructions at home:  Do not drink alcohol if your doctor tells you not to drink.  Limit how much caffeine you take in.  Do not use any products that contain nicotine or tobacco, such as cigarettes, e-cigarettes, and chewing tobacco. If you need help quitting, ask your doctor.  Take over-the-counter and prescription medicines only as told by your doctor. ? Do not stop or change your medicines unless you talk with your doctor about it first. ? Do not take aspirin, ibuprofen, or other NSAIDs unless your doctor told you to do so.  Keep all follow-up visits as told by your doctor. This is important. Contact a doctor if:  You do not get better in 7 days after you start treatment.  You keep having an upset stomach (indigestion) or heartburn. Get help right away if:  You have sudden, sharp pain in your belly (abdomen).  You have belly pain that does not go away.  You have bloody poop (stool) or black, tarry poop.  You throw up blood. It may look  like coffee grounds.  You feel light-headed or feel like you may pass out (faint).  You get weak.  You get sweaty or feel sticky and cold to the touch (clammy). Summary  Symptoms of a peptic ulcer include burning pain in the area between the chest and the belly button.  Take medicines only as told by your doctor.  Limit how much alcohol and caffeine you have.  Keep all follow-up visits as told by your doctor. This information is not intended to replace advice given to you by your health care  provider. Make sure you discuss any questions you have with your health care provider. Document Released: 05/02/2009 Document Revised: 08/13/2017 Document Reviewed: 08/13/2017 Elsevier Interactive Patient Education  2019 [employer].   GERD and peptic ulcer disease information provided  Avoid all forms of aspirin and NSAID products  Increase Protonix to 40 mg twice daily  Further recommendations to follow pending review of pathology report  Months  I called patient's sister at patient's request Roda Shutters) [phone].  Voicemail states-    " Voicemail has not been set up."

## 2018-08-13 NOTE — H&P (Signed)
 @LOGO @   [facility]  [facility] Primary Gastroenterologist:  Dr. Jena Gaussourk  Pre-Procedure History & Physical: HPI:  Cassandra Montoya is a 63 y.o. female here for further evaluation of recurrent dysphagia in a setting of a known Schatzki's ring.  GERD well-controlled on Protonix 40 mg daily.  History of gastric ulcer with satellite erosions seen earlier this year.  Benign biopsies no H. pylori.  Here for surveillance examination and retreatment of Schatzki's ring. Past Medical History:  Diagnosis Date  . Asthma   . COPD (chronic obstructive pulmonary disease) (HCC)   . Ovarian cyst    Right ovarian cyst,   . Pneumonia     Past Surgical History:  Procedure Laterality Date  . BIOPSY  03/26/2018   Procedure: BIOPSY;  Surgeon: Corbin Adeourk, Janyra Barillas M, MD;  [facility];  Service: Endoscopy;;  gastric  . COLONOSCOPY N/A 03/26/2018   Dr. 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  10-year screening colonoscopy  . ESOPHAGOGASTRODUODENOSCOPY N/A 03/26/2018   Dr. Lovena Neighboursoarke: Esophagitis, mild Schatzki ring, gastric ulcer with multiple satellite erosions.  Esophagus was dilated.  Gastric biopsies were benign.  No H. pylori.  Recommend 5563-month surveillance EGD.  Marland Kitchen. MALONEY DILATION N/A 03/26/2018   Procedure: Elease HashimotoMALONEY DILATION;  Surgeon: Corbin Adeourk, Estelle Skibicki M, MD;  [facility];  Service: Endoscopy;  Laterality: N/A;    Prior to Admission medications   Medication Sig Start Date End Date Taking? Authorizing Provider  albuterol (PROVENTIL HFA;VENTOLIN HFA) 108 (90 Base) MCG/ACT inhaler Inhale 1-2 puffs into the lungs every 6 (six) hours as needed for wheezing or shortness of breath.   Yes [provider]  albuterol (PROVENTIL) (2.5 MG/3ML) 0.083% nebulizer solution Take 3 mLs (2.5 mg total) by nebulization every 4 (four) hours as needed for wheezing or shortness of breath. 02/17/17  Yes Kathlen ModyAkula, Vijaya, MD  aspirin EC 81 MG tablet Take 81 mg by mouth  daily.   Yes [provider]  Aspirin-Salicylamide-Caffeine (BC HEADACHE POWDER PO) Take 1 packet by mouth daily as needed (pain).   Yes [provider]  feeding supplement, ENSURE ENLIVE, (ENSURE ENLIVE) LIQD Take 237 mLs by mouth 2 (two) times daily between meals. Patient taking differently: Take 237 mLs by mouth 2 (two) times a week.  02/17/17  Yes Kathlen ModyAkula, Vijaya, MD  Fluticasone-Umeclidin-Vilant (TRELEGY ELLIPTA) 100-62.5-25 MCG/INH AEPB Inhale 1 puff into the lungs daily.    Yes [provider]  gabapentin (NEURONTIN) 300 MG capsule Take 300 mg by mouth daily.   Yes [provider]  pantoprazole (PROTONIX) 40 MG tablet Take 1 tablet (40 mg total) by mouth daily before breakfast. 01/30/18  Yes Tiffany KocherLewis, Leslie S, PA-C    Allergies as of 05/27/2018  . (No Known Allergies)    Family History  Problem Relation Age of Onset  . Stroke Brother   . Hypertension Mother   . Colon cancer Neg Hx     Social History   Socioeconomic History  . Marital status: Widowed    Spouse name: Not on file  . Number of children: Not on file  . Years of education: Not on file  . Highest education level: Not on file  Occupational History  . Not on file  Social Needs  . Financial resource strain: Not on file  . Food insecurity    Worry: Not on file    Inability: Not on file  . Transportation needs    Medical: Not  on file    Non-medical: Not on file  Tobacco Use  . Smoking status: Current Every Day Smoker    Packs/day: 0.50    Types: Cigarettes  . Smokeless tobacco: Never Used  Substance and Sexual Activity  . Alcohol use: No  . Drug use: No  . Sexual activity: Not on file  Lifestyle  . Physical activity    Days per week: Not on file    Minutes per session: Not on file  . Stress: Not on file  Relationships  . Social Herbalist on phone: Not on file    Gets together: Not on file    Attends religious service: Not on file    Active member of club  or organization: Not on file    Attends meetings of clubs or organizations: Not on file    Relationship status: Not on file  . Intimate partner violence    Fear of current or ex partner: Not on file    Emotionally abused: Not on file    Physically abused: Not on file    Forced sexual activity: Not on file  Other Topics Concern  . Not on file  Social History Narrative  . Not on file    Review of Systems: See HPI, otherwise negative ROS  Physical Exam: BP (!) 164/88   Pulse 80   Temp 97.8 F (36.6 C) (Oral)   Resp (!) 24   Ht 4' (1.219 m)   Wt 56.7 kg   SpO2 99%   BMI 38.14 kg/m  General:   Alert,  Well-developed, well-nourished, pleasant and cooperative in NAD Neck:  Supple; no masses or thyromegaly. No significant cervical adenopathy. Lungs:  Clear throughout to auscultation.   No wheezes, crackles, or rhonchi. No acute distress. Heart:  Regular rate and rhythm; no murmurs, clicks, rubs,  or gallops. Abdomen: Non-distended, normal bowel sounds.  Soft and nontender without appreciable mass or hepatosplenomegaly.  Pulses:  Normal pulses noted. Extremities:  Without clubbing or edema.  Impression/Plan: 63 year old lady with history of peptic ulcer disease (GU) and recurrent esophageal dysphagia in the setting of a Schatzki's ring.  EGD now being done as per plan.  The risks, benefits, limitations, alternatives and imponderables have been reviewed with the patient. Potential for esophageal dilation, biopsy, etc. have also been reviewed.  Questions have been answered. All parties agreeable.     Notice: This dictation was prepared with Dragon dictation along with smaller phrase technology. Any transcriptional errors that result from this process are unintentional and may not be corrected upon review.

## 2018-08-13 NOTE — Op Note (Signed)
[facility] Patient Name: Cassandra Montoya Procedure Date: 08/13/2018 12:24 PM MRN: [id number] Date of Birth: [birth year] Attending MD: Norvel Richards , MD CSN: [id number] Age: 63 Admit Type: Outpatient Procedure:                Upper GI endoscopy Indications:              Surveillance procedure, Dysphagia Providers:                Norvel Richards, MD, Jeanann Lewandowsky. Sharon Seller, RN,                            Nelma Rothman, Technician Referring MD:              Medicines:                Midazolam 4 mg IV, Meperidine 25 mg IV Complications:            No immediate complications. Estimated Blood Loss:     Estimated blood loss was minimal. Procedure:                Pre-Anesthesia Assessment:                           - Prior to the procedure, a History and Physical                            was performed, and patient medications and                            allergies were reviewed. The patient's tolerance of                            previous anesthesia was also reviewed. The risks                            and benefits of the procedure and the sedation                            options and risks were discussed with the patient.                            All questions were answered, and informed consent                            was obtained. Prior Anticoagulants: The patient has                            taken no previous anticoagulant or antiplatelet                            agents. ASA Grade Assessment: II - A patient with                            mild systemic disease. After reviewing the risks  and benefits, the patient was deemed in                            satisfactory condition to undergo the procedure.                           After obtaining informed consent, the endoscope was                            passed under direct vision. Throughout the                            procedure, the patient's blood pressure, pulse, and                oxygen saturations were monitored continuously. The                            GIF-H190 ([id number] scope was introduced through the                            mouth, and advanced to the second part of duodenum.                            The upper GI endoscopy was accomplished without                            difficulty. The patient tolerated the procedure                            well. Scope In: 12:49:34 PM Scope Out: 1:01:24 PM Total Procedure Duration: 0 hours 11 minutes 50 seconds  Findings:      Esophagitis was found. Scattered erosions at the GE junction. No       Barrett's epithelium seen      A non-obstructing Schatzki ring was found at the gastroesophageal       junction. The scope was withdrawn. Dilation was performed with a Maloney       dilator with mild resistance at 54 Fr. The dilation site was examined       and showed mild mucosal disruption. Estimated blood loss was minimal.      Scattered mucosal changes antral erosions with persisting elliptical       prepyloric antral ulcer. Appeared to be unchanged from prior exam.       Please see photos. Did not appear to be a malignant process. Were found       in the gastric antrum.      The duodenal bulb and second portion of the duodenum were normal.       Gastric ulcer biopsied. Impression:               -Mild erosive reflux esophagitis .                           - Non-obstructing Schatzki ring. Dilated.                           -Assisting gastric ulcer and associated erosions  status post biopsy                           - Normal duodenal bulb and second portion of the                            duodenum. Moderate Sedation:      Moderate (conscious) sedation was administered by the endoscopy nurse       and supervised by the endoscopist. The following parameters were       monitored: oxygen saturation, heart rate, blood pressure, respiratory       rate, EKG, adequacy of pulmonary  ventilation, and response to care. Recommendation:           - Patient has a contact number available for                            emergencies. The signs and symptoms of potential                            delayed complications were discussed with the                            patient. Return to normal activities tomorrow.                            Written discharge instructions were provided to the                            patient. Increase Protonix to 40 mg twice daily.                            Avoid all aspirin/NSAIDs. Follow-up on pathology.                            Office visit with us in 3 months Procedure Code(s):        --- Professional ---                           [phone], Esophagogastroduodenoscopy, flexible,                            transoral; diagnostic, including collection of                            specimen(s) by brushing or washing, when performed                            (separate procedure)                           43450, Dilation of esophagus, by unguided sound or                            bougie, single or multiple passes Diagnosis Code(s):        --- Professional ---  K20.9, Esophagitis, unspecified                           K22.2, Esophageal obstruction                           K31.89, Other diseases of stomach and duodenum                           R13.10, Dysphagia, unspecified CPT copyright 2019 American Medical Association. All rights reserved. The codes documented in this report are preliminary and upon coder review may  be revised to meet current compliance requirements. Gerrit Friendsobert M. Necie Wilcoxson, MD Gennette Pacobert Michael Shahir Karen, MD 08/13/2018 1:16:42 PM This report has been signed electronically. Number of Addenda: 0

## 2018-08-15 ENCOUNTER — Encounter: Payer: Self-pay | Admitting: Internal Medicine

## 2018-08-19 ENCOUNTER — Encounter (HOSPITAL_COMMUNITY): Payer: Self-pay | Admitting: Internal Medicine

## 2019-04-20 ENCOUNTER — Encounter: Payer: Self-pay | Admitting: Internal Medicine

## 2019-10-14 ENCOUNTER — Encounter: Payer: Self-pay | Admitting: Orthopaedic Surgery

## 2019-10-28 ENCOUNTER — Ambulatory Visit (INDEPENDENT_AMBULATORY_CARE_PROVIDER_SITE_OTHER): Payer: PRIVATE HEALTH INSURANCE | Admitting: Orthopaedic Surgery

## 2019-10-28 ENCOUNTER — Other Ambulatory Visit: Payer: Self-pay

## 2019-10-28 ENCOUNTER — Encounter: Payer: Self-pay | Admitting: Orthopaedic Surgery

## 2019-10-28 VITALS — BP 166/89 | HR 63 | Ht 60.0 in | Wt 163.0 lb

## 2019-10-28 DIAGNOSIS — G8929 Other chronic pain: Secondary | ICD-10-CM

## 2019-10-28 DIAGNOSIS — M545 Low back pain, unspecified: Secondary | ICD-10-CM

## 2019-10-28 DIAGNOSIS — M79604 Pain in right leg: Secondary | ICD-10-CM

## 2019-10-28 NOTE — Progress Notes (Signed)
MRI

## 2019-10-28 NOTE — Progress Notes (Signed)
Subjective:    Patient ID: Cassandra Montoya, female    DOB: Jul 01, 1955, 64 y.o.   MRN: [id number]  HPI She has a long history of lower back pain.  I have her notes from [facility]. She has been followed there documented since 12-2018 for lower back pain with paresthesias to the feet.  She has no trauma.  She is getting worse.  She has no bowel or bladder problem.  She has pain walking and bending.  She has pain now with prolonged sitting.  I have independently reviewed and interpreted x-rays of this patient done at another site by another physician or qualified health professional.  She has spondylolisthesis at L4-L5 and L3-L4 also.  She has DJD.  She is on Neurontin but that has not helped.  She is on Toradol but that has not helped.  She has tried heat, ice, and rubs with no help.  I will get MRI of the lumbar spine.    Review of Systems  Constitutional: Positive for activity change.  Respiratory: Positive for shortness of breath and wheezing.   Musculoskeletal: Positive for arthralgias, back pain and gait problem.  All other systems reviewed and are negative.  For Review of Systems, all other systems reviewed and are negative.  The following is a summary of the past history medically, past history surgically, known current medicines, social history and family history.  This information is gathered electronically by the computer from prior information and documentation.  I review this each visit and have found including this information at this point in the chart is beneficial and informative.   Past Medical History:  Diagnosis Date  . Asthma   . COPD (chronic obstructive pulmonary disease) (HCC)   . Ovarian cyst    Right ovarian cyst,   . Pneumonia     Past Surgical History:  Procedure Laterality Date  . BIOPSY  03/26/2018   Procedure: BIOPSY;  Surgeon: Corbin Ade, MD;  [facility];  Service: Endoscopy;;  gastric  . BIOPSY  08/13/2018    Procedure: BIOPSY;  Surgeon: Corbin Ade, MD;  [facility];  Service: Endoscopy;;  gastric  . COLONOSCOPY N/A 03/26/2018   Dr. 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  10-year screening colonoscopy  . ESOPHAGOGASTRODUODENOSCOPY N/A 03/26/2018   Dr. Lovena Neighbours: Esophagitis, mild Schatzki ring, gastric ulcer with multiple satellite erosions.  Esophagus was dilated.  Gastric biopsies were benign.  No H. pylori.  Recommend 14-month surveillance EGD.  Marland Kitchen ESOPHAGOGASTRODUODENOSCOPY N/A 08/13/2018   Procedure: ESOPHAGOGASTRODUODENOSCOPY (EGD);  Surgeon: Corbin Ade, MD;  [facility];  Service: Endoscopy;  Laterality: N/A;  12:15pm  . MALONEY DILATION N/A 03/26/2018   Procedure: Elease Hashimoto DILATION;  Surgeon: Corbin Ade, MD;  [facility];  Service: Endoscopy;  Laterality: N/A;  Elease Hashimoto DILATION N/A 08/13/2018   Procedure: Elease Hashimoto DILATION;  Surgeon: Corbin Ade, MD;  [facility];  Service: Endoscopy;  Laterality: N/A;    Current Outpatient Medications on File Prior to Visit  Medication Sig Dispense Refill  . albuterol (PROVENTIL HFA;VENTOLIN HFA) 108 (90 Base) MCG/ACT inhaler Inhale 1-2 puffs into the lungs every 6 (six) hours as needed for wheezing or shortness of breath.    Marland Kitchen albuterol (PROVENTIL) (2.5 MG/3ML) 0.083% nebulizer solution Take 3 mLs (2.5 mg total) by nebulization every 4 (four) hours as needed for wheezing or shortness of breath. 75 mL 12  .  aspirin EC 81 MG tablet Take 81 mg by mouth daily.    . Aspirin-Salicylamide-Caffeine (BC HEADACHE POWDER PO) Take 1 packet by mouth daily as needed (pain).    Marland Kitchen escitalopram (LEXAPRO) 5 MG tablet     . feeding supplement, ENSURE ENLIVE, (ENSURE ENLIVE) LIQD Take 237 mLs by mouth 2 (two) times daily between meals. (Patient taking differently: Take 237 mLs by mouth 2 (two) times a week. ) 237 mL 12  . Fluticasone-Umeclidin-Vilant (TRELEGY ELLIPTA) 100-62.5-25 MCG/INH AEPB Inhale 1 puff into  the lungs daily.     Marland Kitchen gabapentin (NEURONTIN) 300 MG capsule Take 300 mg by mouth daily.    . pantoprazole (PROTONIX) 40 MG tablet Take 1 tablet (40 mg total) by mouth daily before breakfast. 30 tablet 5  . traMADol (ULTRAM) 50 MG tablet Take 50 mg by mouth every 6 (six) hours as needed.     No current facility-administered medications on file prior to visit.    Social History   Socioeconomic History  . Marital status: Widowed    Spouse name: Not on file  . Number of children: Not on file  . Years of education: Not on file  . Highest education level: Not on file  Occupational History  . Not on file  Tobacco Use  . Smoking status: Current Every Day Smoker    Packs/day: 0.50    Types: Cigarettes  . Smokeless tobacco: Never Used  Vaping Use  . Vaping Use: Never used  Substance and Sexual Activity  . Alcohol use: No  . Drug use: No  . Sexual activity: Not on file  Other Topics Concern  . Not on file  Social History Narrative  . Not on file   Social Determinants of Health   Financial Resource Strain:   . Difficulty of Paying Living Expenses: Not on file  Food Insecurity:   . Worried About Programme researcher, broadcasting/film/video in the Last Year: Not on file  . Ran Out of Food in the Last Year: Not on file  Transportation Needs:   . Lack of Transportation (Medical): Not on file  . Lack of Transportation (Non-Medical): Not on file  Physical Activity:   . Days of Exercise per Week: Not on file  . Minutes of Exercise per Session: Not on file  Stress:   . Feeling of Stress : Not on file  Social Connections:   . Frequency of Communication with Friends and Family: Not on file  . Frequency of Social Gatherings with Friends and Family: Not on file  . Attends Religious Services: Not on file  . Active Member of Clubs or Organizations: Not on file  . Attends Banker Meetings: Not on file  . Marital Status: Not on file  Intimate Partner Violence:   . Fear of Current or Ex-Partner: Not  on file  . Emotionally Abused: Not on file  . Physically Abused: Not on file  . Sexually Abused: Not on file    Family History  Problem Relation Age of Onset  . Stroke Brother   . Hypertension Mother   . Colon cancer Neg Hx     BP (!) 166/89   Pulse 63   Ht 5' (1.524 m)   Wt 163 lb (73.9 kg)   BMI 31.83 kg/m   Body mass index is 31.83 kg/m.      Objective:   Physical Exam Vitals and nursing note reviewed.  Constitutional:      Appearance: She is well-developed.  HENT:  Head: Normocephalic and atraumatic.  Eyes:     Conjunctiva/sclera: Conjunctivae normal.     Pupils: Pupils are equal, round, and reactive to light.  Cardiovascular:     Rate and Rhythm: Normal rate and regular rhythm.  Pulmonary:     Effort: Pulmonary effort is normal.  Abdominal:     Palpations: Abdomen is soft.  Musculoskeletal:       Arms:     Cervical back: Normal range of motion and neck supple.  Skin:    General: Skin is warm and dry.  Neurological:     Mental Status: She is alert and oriented to person, place, and time.     Cranial Nerves: No cranial nerve deficit.     Motor: No abnormal muscle tone.     Coordination: Coordination normal.     Deep Tendon Reflexes: Reflexes are normal and symmetric. Reflexes normal.  Psychiatric:        Behavior: Behavior normal.        Thought Content: Thought content normal.        Judgment: Judgment normal.           Assessment & Plan:   Encounter Diagnoses  Name Primary?  . Chronic bilateral low back pain, unspecified whether sciatica present Yes  . Lumbar pain with radiation down both legs    I will get MRI of the lumbar spine.  I am concerned about HNP.  She is to continue her present medicine.  She will need open unit.  Call if any problem.  Precautions discussed.   Electronically Signed Darreld Mclean, MD 9/8/20218:33 AM

## 2019-11-16 ENCOUNTER — Other Ambulatory Visit: Payer: Self-pay | Admitting: Orthopaedic Surgery

## 2019-11-18 ENCOUNTER — Other Ambulatory Visit: Payer: PRIVATE HEALTH INSURANCE

## 2019-11-19 ENCOUNTER — Ambulatory Visit: Payer: PRIVATE HEALTH INSURANCE | Admitting: Orthopaedic Surgery

## 2019-11-24 ENCOUNTER — Ambulatory Visit: Payer: PRIVATE HEALTH INSURANCE | Admitting: Orthopaedic Surgery

## 2019-11-26 ENCOUNTER — Ambulatory Visit (INDEPENDENT_AMBULATORY_CARE_PROVIDER_SITE_OTHER): Payer: PRIVATE HEALTH INSURANCE | Admitting: Orthopaedic Surgery

## 2019-11-26 ENCOUNTER — Other Ambulatory Visit: Payer: Self-pay

## 2019-11-26 ENCOUNTER — Encounter: Payer: Self-pay | Admitting: Orthopaedic Surgery

## 2019-11-26 VITALS — BP 146/77 | HR 64 | Ht 60.0 in | Wt 163.0 lb

## 2019-11-26 DIAGNOSIS — M545 Low back pain, unspecified: Secondary | ICD-10-CM | POA: Diagnosis not present

## 2019-11-26 DIAGNOSIS — G8929 Other chronic pain: Secondary | ICD-10-CM

## 2019-11-26 DIAGNOSIS — M79605 Pain in left leg: Secondary | ICD-10-CM | POA: Diagnosis not present

## 2019-11-26 DIAGNOSIS — M79604 Pain in right leg: Secondary | ICD-10-CM

## 2019-11-26 MED ORDER — HYDROCODONE-ACETAMINOPHEN 5-325 MG PO TABS: ORAL_TABLET | ORAL | 0 refills | Status: DC

## 2019-11-26 NOTE — Progress Notes (Signed)
Patient RU:Cassandra Montoya, female DOB:[birth year], 64 y.o. UJW:[id number]  Chief Complaint  Patient presents with  . Back Pain    LBP     HPI  Cassandra Montoya is a 64 y.o. female who has worsening lower back pain.  Her MRI was denied.  They wanted PT.  She is already doing exercises at home.  I will set up formal PT.  She had PT in December but that was too long ago.  I will give pain medicine.    Body mass index is 31.83 kg/m.  ROS  Review of Systems  Constitutional: Positive for activity change.  Respiratory: Positive for shortness of breath and wheezing.   Musculoskeletal: Positive for arthralgias, back pain and gait problem.  All other systems reviewed and are negative.   All other systems reviewed and are negative.  The following is a summary of the past history medically, past history surgically, known current medicines, social history and family history.  This information is gathered electronically by the computer from prior information and documentation.  I review this each visit and have found including this information at this point in the chart is beneficial and informative.    Past Medical History:  Diagnosis Date  . Asthma   . COPD (chronic obstructive pulmonary disease) (HCC)   . Ovarian cyst    Right ovarian cyst,   . Pneumonia     Past Surgical History:  Procedure Laterality Date  . BIOPSY  03/26/2018   Procedure: BIOPSY;  Surgeon: Corbin Ade, MD;  [facility];  Service: Endoscopy;;  gastric  . BIOPSY  08/13/2018   Procedure: BIOPSY;  Surgeon: Corbin Ade, MD;  [facility];  Service: Endoscopy;;  gastric  . COLONOSCOPY N/A 03/26/2018   Dr. 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  10-year screening colonoscopy  . ESOPHAGOGASTRODUODENOSCOPY N/A 03/26/2018   Dr. Lovena Neighbours: Esophagitis, mild Schatzki ring, gastric ulcer with multiple satellite erosions.  Esophagus was dilated.  Gastric biopsies were benign.  No H. pylori.   Recommend 41-month surveillance EGD.  Marland Kitchen ESOPHAGOGASTRODUODENOSCOPY N/A 08/13/2018   Procedure: ESOPHAGOGASTRODUODENOSCOPY (EGD);  Surgeon: Corbin Ade, MD;  [facility];  Service: Endoscopy;  Laterality: N/A;  12:15pm  . MALONEY DILATION N/A 03/26/2018   Procedure: Elease Hashimoto DILATION;  Surgeon: Corbin Ade, MD;  [facility];  Service: Endoscopy;  Laterality: N/A;  Elease Hashimoto DILATION N/A 08/13/2018   Procedure: Elease Hashimoto DILATION;  Surgeon: Corbin Ade, MD;  [facility];  Service: Endoscopy;  Laterality: N/A;    Family History  Problem Relation Age of Onset  . Stroke Brother   . Hypertension Mother   . Colon cancer Neg Hx     Social History Social History   Tobacco Use  . Smoking status: Current Every Day Smoker    Packs/day: 0.50    Types: Cigarettes  . Smokeless tobacco: Never Used  Vaping Use  . Vaping Use: Never used  Substance Use Topics  . Alcohol use: No  . Drug use: No    No Known Allergies  Current Outpatient Medications  Medication Sig Dispense Refill  . albuterol (PROVENTIL HFA;VENTOLIN HFA) 108 (90 Base) MCG/ACT inhaler Inhale 1-2 puffs into the lungs every 6 (six) hours as needed for wheezing or shortness of breath.    Marland Kitchen albuterol (PROVENTIL) (2.5 MG/3ML) 0.083% nebulizer solution Take 3 mLs (2.5 mg total) by nebulization every 4 (four) hours as needed for  wheezing or shortness of breath. 75 mL 12  . aspirin EC 81 MG tablet Take 81 mg by mouth daily.    . Aspirin-Salicylamide-Caffeine (BC HEADACHE POWDER PO) Take 1 packet by mouth daily as needed (pain).    Marland Kitchen escitalopram (LEXAPRO) 5 MG tablet     . feeding supplement, ENSURE ENLIVE, (ENSURE ENLIVE) LIQD Take 237 mLs by mouth 2 (two) times daily between meals. (Patient taking differently: Take 237 mLs by mouth 2 (two) times a week. ) 237 mL 12  . Fluticasone-Umeclidin-Vilant (TRELEGY ELLIPTA) 100-62.5-25 MCG/INH AEPB Inhale 1 puff into the lungs daily.     Marland Kitchen gabapentin  (NEURONTIN) 300 MG capsule Take 300 mg by mouth daily.    Marland Kitchen HYDROcodone-acetaminophen (NORCO/VICODIN) 5-325 MG tablet One tablet every four hours for pain. 30 tablet 0  . pantoprazole (PROTONIX) 40 MG tablet Take 1 tablet (40 mg total) by mouth daily before breakfast. 30 tablet 5  . traMADol (ULTRAM) 50 MG tablet Take 50 mg by mouth every 6 (six) hours as needed.     No current facility-administered medications for this visit.     Physical Exam  Blood pressure (!) 146/77, pulse 64, height 5' (1.524 m), weight 163 lb (73.9 kg).  Constitutional: overall normal hygiene, normal nutrition, well developed, normal grooming, normal body habitus. Assistive device:none  Musculoskeletal: gait and station Limp none, muscle tone and strength are normal, no tremors or atrophy is present.  .  Neurological: coordination overall normal.  Deep tendon reflex/nerve stretch intact.  Sensation normal.  Cranial nerves II-XII intact.   Skin:   Normal overall no scars, lesions, ulcers or rashes. No psoriasis.  Psychiatric: Alert and oriented x 3.  Recent memory intact, remote memory unclear.  Normal mood and affect. Well groomed.  Good eye contact.  Cardiovascular: overall no swelling, no varicosities, no edema bilaterally, normal temperatures of the legs and arms, no clubbing, cyanosis and good capillary refill.  Lymphatic: palpation is normal.  Spine/Pelvis examination:  Inspection:  Overall, sacoiliac joint benign and hips nontender; without crepitus or defects.   Thoracic spine inspection: Alignment normal without kyphosis present   Lumbar spine inspection:  Alignment  with normal lumbar lordosis, without scoliosis apparent.   Thoracic spine palpation:  without tenderness of spinal processes   Lumbar spine palpation: with tenderness of lumbar area; with tightness of lumbar muscles    Range of Motion:   Lumbar flexion, forward flexion is abnormal with pain or tenderness    Lumbar extension is full  with pain and tenderness   Left lateral bend is abnormal with pain and tenderness   Right lateral bend is abnormal with pain and tenderness   Straight leg raising is normal  Strength & tone: normal   Stability overall normal stability  All other systems reviewed and are negative   The patient has been educated about the nature of the problem(s) and counseled on treatment options.  The patient appeared to understand what I have discussed and is in agreement with it.  Encounter Diagnoses  Name Primary?  . Lumbar pain with radiation down both legs Yes  . Chronic bilateral low back pain, unspecified whether sciatica present     PLAN Call if any problems.  Precautions discussed.  Continue current medications.   Return to clinic 2 weeks   Begin PT.  I have reviewed the [place] Controlled Substance Reporting System web site prior to prescribing narcotic medicine for this patient.   Electronically Signed Darreld Mclean, MD 10/7/20219:57 AM

## 2019-12-01 ENCOUNTER — Encounter (HOSPITAL_COMMUNITY): Payer: Self-pay | Admitting: Physical Therapy

## 2019-12-01 ENCOUNTER — Other Ambulatory Visit: Payer: Self-pay

## 2019-12-01 ENCOUNTER — Ambulatory Visit (HOSPITAL_COMMUNITY): Payer: PRIVATE HEALTH INSURANCE | Attending: Orthopaedic Surgery | Admitting: Physical Therapy

## 2019-12-01 DIAGNOSIS — M545 Low back pain, unspecified: Secondary | ICD-10-CM | POA: Diagnosis not present

## 2019-12-01 DIAGNOSIS — M6281 Muscle weakness (generalized): Secondary | ICD-10-CM | POA: Insufficient documentation

## 2019-12-01 DIAGNOSIS — R2689 Other abnormalities of gait and mobility: Secondary | ICD-10-CM | POA: Diagnosis present

## 2019-12-01 DIAGNOSIS — R29898 Other symptoms and signs involving the musculoskeletal system: Secondary | ICD-10-CM | POA: Insufficient documentation

## 2019-12-01 NOTE — Therapy (Signed)
[facility] [address] [place], [place], [address] Phone: [phone]   Fax:  [phone]  Physical Therapy Evaluation  Patient Details  Name: Cassandra Montoya MRN: [id number] Date of Birth: [birth year] Referring Provider (PT): Darreld Mclean MD   Encounter Date: 12/01/2019   PT End of Session - 12/01/19 1127    Visit Number 1    Number of Visits 12    Date for PT Re-Evaluation 01/12/20    Authorization Type [employer] (no vl, auth required)    Progress Note Due on Visit 10    PT Start Time 1052    PT Stop Time 1127    PT Time Calculation (min) 35 min    Activity Tolerance Patient tolerated treatment well    Behavior During Therapy [facility] for tasks assessed/performed           Past Medical History:  Diagnosis Date  . Asthma   . COPD (chronic obstructive pulmonary disease) (HCC)   . Ovarian cyst    Right ovarian cyst,   . Pneumonia     Past Surgical History:  Procedure Laterality Date  . BIOPSY  03/26/2018   Procedure: BIOPSY;  Surgeon: Corbin Ade, MD;  [facility];  Service: Endoscopy;;  gastric  . BIOPSY  08/13/2018   Procedure: BIOPSY;  Surgeon: Corbin Ade, MD;  [facility];  Service: Endoscopy;;  gastric  . COLONOSCOPY N/A 03/26/2018   Dr. 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  10-year screening colonoscopy  . ESOPHAGOGASTRODUODENOSCOPY N/A 03/26/2018   Dr. Lovena Neighbours: Esophagitis, mild Schatzki ring, gastric ulcer with multiple satellite erosions.  Esophagus was dilated.  Gastric biopsies were benign.  No H. pylori.  Recommend 48-month surveillance EGD.  Marland Kitchen ESOPHAGOGASTRODUODENOSCOPY N/A 08/13/2018   Procedure: ESOPHAGOGASTRODUODENOSCOPY (EGD);  Surgeon: Corbin Ade, MD;  [facility];  Service: Endoscopy;  Laterality: N/A;  12:15pm  . MALONEY DILATION N/A 03/26/2018   Procedure: Elease Hashimoto DILATION;  Surgeon: Corbin Ade, MD;  [facility];  Service: Endoscopy;   Laterality: N/A;  Elease Hashimoto DILATION N/A 08/13/2018   Procedure: Elease Hashimoto DILATION;  Surgeon: Corbin Ade, MD;  [facility];  Service: Endoscopy;  Laterality: N/A;    There were no vitals filed for this visit.    Subjective Assessment - 12/01/19 1054    Subjective Patient is a 64 y.o. female who presents to physical therapy with c/o chronic low back pain. Patient states she has low back pain that goes all the way down to her feet. She states numbness throughout her legs. She went to therapy in [place] in December but they did not help and she continues to do some of the exercises. Patient states symptoms are worse every day and it hurts with standing, laying, walking, sitting. Nothing helps to decrease her symptoms. Her main goal is to get better. Her back has been hurting her for about 2-3 years.    Limitations Sitting;Lifting;Standing;Walking;House hold activities    How long can you walk comfortably? unable    Patient Stated Goals to get better    Currently in Pain? Yes    Pain Score 8     Pain Location Back              [facility] PT Assessment - 12/01/19 0001      Assessment   Medical Diagnosis LBP with radiating pain into both legs    Referring Provider (PT)  Darreld Mclean MD    Onset Date/Surgical Date 11/30/16    Next MD Visit unsure    Prior Therapy Yes for same issue last year      Precautions   Precautions None      Restrictions   Weight Bearing Restrictions No      Balance Screen   Has the patient fallen in the past 6 months No    Has the patient had a decrease in activity level because of a fear of falling?  No    Is the patient reluctant to leave their home because of a fear of falling?  No      Prior Function   Level of Independence Independent    Vocation Part time employment      Cognition   Overall Cognitive Status Within Functional Limits for tasks assessed      Observation/Other Assessments   Observations Ambulates with antalgic gait with  trunk flexed, constantly chaning positions in seated    Focus on Therapeutic Outcomes (FOTO)  75% limited      Sensation   Light Touch Appears Intact      Posture/Postural Control   Posture/Postural Control Postural limitations    Postural Limitations Rounded Shoulders;Forward head    Posture Comments slouched in seated      ROM / Strength   AROM / PROM / Strength AROM;Strength      AROM   Overall AROM Comments c/o pain throughout    AROM Assessment Site Lumbar    Lumbar Flexion 25% limited    Lumbar Extension 75% limited    Lumbar - Right Side Bend 50% limited    Lumbar - Left Side Bend 50% limited    Lumbar - Right Rotation 50% limited    Lumbar - Left Rotation 50% limited      Strength   Strength Assessment Site Hip;Knee;Ankle    Right/Left Hip Right;Left    Right Hip Flexion 3/5    Left Hip Flexion 3+/5    Right/Left Knee Right;Left    Right Knee Flexion 4-/5    Right Knee Extension 4/5    Left Knee Flexion 4/5    Left Knee Extension 4/5    Right/Left Ankle Right;Left    Right Ankle Dorsiflexion 4+/5    Left Ankle Dorsiflexion 4+/5      Palpation   Palpation comment TTP lumbar paraspinals      Transfers   Five time sit to stand comments  26.06 seconds    Comments without UE use, slow, labored      Ambulation/Gait   Ambulation/Gait Yes    Ambulation/Gait Assistance 6: Modified independent (Device/Increase time)    Ambulation Distance (Feet) 170 Feet    Assistive device None    Gait Pattern Decreased hip/knee flexion - right;Decreased hip/knee flexion - left;Trunk flexed;Decreased trunk rotation    Ambulation Surface Level;Indoor    Gait velocity decreased    Gait Comments 2 MWT, 2 standing rest breaks, slow, labored, SOB throughout                      Objective measurements completed on examination: See above findings.               PT Education - 12/01/19 1100    Education Details Patient educated on exam findings, POC, scope of  PT    Person(s) Educated Patient    Methods Explanation;Demonstration    Comprehension Verbalized understanding;Returned demonstration  PT Short Term Goals - 12/01/19 1218      PT SHORT TERM GOAL #1   Title Patient will be independent with HEP in order to improve functional outcomes.    Time 3    Period Weeks    Status New    Target Date 12/22/19      PT SHORT TERM GOAL #2   Title Patient will report at least 25% improvement in symptoms for improved quality of life.    Time 3    Period Weeks    Status New    Target Date 12/22/19             PT Long Term Goals - 12/01/19 1218      PT LONG TERM GOAL #1   Title Patient will report at least 75% improvement in symptoms for improved quality of life.    Time 6    Period Weeks    Status New    Target Date 01/12/20      PT LONG TERM GOAL #2   Title Patient will improve FOTO score by at least 10 points in order to indicate improved tolerance to activity.    Time 6    Period Weeks    Status New    Target Date 01/12/20      PT LONG TERM GOAL #3   Title Patient will be able to complete 5x STS in under 11.4 seconds in order to reduce the risk of falls.    Time 6    Period Weeks    Status New    Target Date 01/12/20      PT LONG TERM GOAL #4   Title Patient will be able to ambulate at least 226 feet in to demonstrate improved gait speed for ambulation.    Time 6    Period Weeks    Status New    Target Date 01/12/20                  Plan - 12/01/19 1146    Clinical Impression Statement Patient is a 64 y.o. female who presents to physical therapy with c/o chronic low back pain and bilateral LE pain. She presents with pain limited deficits in lumbar and BLE strength, ROM, endurance, balance, gait, activity tolerance, postural impairments, spinal mobility and functional mobility with ADL. She is having to modify and restrict ADL as indicated by FOTO score as well as subjective information and  objective measures which is affecting overall participation. Patient will benefit from skilled physical therapy in order to improve function and reduce impairment.    Personal Factors and Comorbidities Comorbidity 3+;Behavior Pattern;Fitness;Past/Current Experience;Time since onset of injury/illness/exacerbation    Comorbidities Anxiety, Arthritis, Back pain, Chronic Obstructive PulmonaryDisease, High Blood Pressure    Examination-Activity Limitations Bathing;Bed Mobility;Bend;Caring for Others;Carry;Dressing;Hygiene/Grooming;Lift;Locomotion Level;Sit;Sleep;Squat;Stairs;Stand;Transfers    Examination-Participation Restrictions Cleaning;Community Activity;Yard Work;Volunteer;Shop;Occupation    Stability/Clinical Decision Making Evolving/Moderate complexity    Clinical Decision Making Moderate    Rehab Potential Fair    PT Frequency 2x / week    PT Duration 6 weeks    PT Treatment/Interventions ADLs/Self Care Home Management;Aquatic Therapy;Biofeedback;Cryotherapy;Electrical Stimulation;Iontophoresis 4mg /ml Dexamethasone;Moist Heat;Traction;Ultrasound;DME Instruction;Gait training;Stair training;Functional mobility training;Therapeutic activities;Therapeutic exercise;Balance training;Neuromuscular re-education;Patient/family education;Orthotic Fit/Training;Manual techniques;Passive range of motion;Dry needling;Energy conservation;Splinting;Spinal Manipulations;Joint Manipulations    PT Next Visit Plan begin TRA activation exercises, glute/LE strengthening, possibly manual for pain, and improve lumbar mobility as able    PT Home Exercise Plan initiate next session    Consulted and Agree with Plan  of Care Patient           Patient will benefit from skilled therapeutic intervention in order to improve the following deficits and impairments:  Abnormal gait, Decreased range of motion, Difficulty walking, Cardiopulmonary status limiting activity, Decreased endurance, Increased muscle spasms, Decreased  activity tolerance, Impaired perceived functional ability, Pain, Decreased balance, Improper body mechanics, Decreased mobility, Decreased strength, Postural dysfunction  Visit Diagnosis: Low back pain, unspecified back pain laterality, unspecified chronicity, unspecified whether sciatica present  Muscle weakness (generalized)  Other abnormalities of gait and mobility  Other symptoms and signs involving the musculoskeletal system     Problem List Patient Active Problem List   Diagnosis Date Noted  . Gastric ulcer 05/26/2018  . Abdominal pain, epigastric 01/30/2018  . Abnormal weight loss 01/30/2018  . Dysphagia 01/30/2018  . Loss of appetite 01/30/2018  . Nonpulsatile abdominal mass 01/30/2018  . Influenza A 02/14/2017  . Hypokalemia 02/14/2017  . Hyperglycemia, drug-induced 08/13/2015  . COPD with acute exacerbation (HCC) 08/12/2015  . Acute respiratory distress 08/12/2015  . Tobacco abuse 05/25/2014   12:21 PM, 12/01/19 Wyman SongsterAndrew S. Beronica Lansdale PT, DPT [facility]    [facility] [address] [place] , [place], [address] Phone: [phone]   Fax:  [phone]  Name: Cassandra PepperMary L Montoya MRN: [id number] Date of Birth: 01/30/1956

## 2019-12-03 ENCOUNTER — Ambulatory Visit (HOSPITAL_COMMUNITY): Payer: PRIVATE HEALTH INSURANCE

## 2019-12-03 ENCOUNTER — Other Ambulatory Visit: Payer: Self-pay

## 2019-12-03 ENCOUNTER — Encounter (HOSPITAL_COMMUNITY): Payer: Self-pay

## 2019-12-03 DIAGNOSIS — M545 Low back pain, unspecified: Secondary | ICD-10-CM

## 2019-12-03 DIAGNOSIS — R29898 Other symptoms and signs involving the musculoskeletal system: Secondary | ICD-10-CM

## 2019-12-03 DIAGNOSIS — R2689 Other abnormalities of gait and mobility: Secondary | ICD-10-CM

## 2019-12-03 DIAGNOSIS — M6281 Muscle weakness (generalized): Secondary | ICD-10-CM

## 2019-12-03 NOTE — Therapy (Signed)
[facility] [address] [place], [place], [address] Phone: [phone]   Fax:  [phone]  Physical Therapy Treatment  Patient Details  Name: Cassandra Montoya MRN: [id number] Date of Birth: 1956-02-03 Referring Provider (PT): Darreld Mclean MD   Encounter Date: 12/03/2019   PT End of Session - 12/03/19 0844    Visit Number 2    Number of Visits 12    Date for PT Re-Evaluation 01/12/20    Authorization Type Ambetter (no vl, auth required)    Progress Note Due on Visit 10    PT Start Time 0846    PT Stop Time 0931    PT Time Calculation (min) 45 min    Activity Tolerance Patient tolerated treatment well    Behavior During Therapy [facility] for tasks assessed/performed           Past Medical History:  Diagnosis Date   Asthma    COPD (chronic obstructive pulmonary disease) (HCC)    Ovarian cyst    Right ovarian cyst,    Pneumonia     Past Surgical History:  Procedure Laterality Date   BIOPSY  03/26/2018   Procedure: BIOPSY;  Surgeon: Corbin Ade, MD;  [facility];  Service: Endoscopy;;  gastric   BIOPSY  08/13/2018   Procedure: BIOPSY;  Surgeon: Corbin Ade, MD;  [facility];  Service: Endoscopy;;  gastric   COLONOSCOPY N/A 03/26/2018   Dr. 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  10-year screening colonoscopy   ESOPHAGOGASTRODUODENOSCOPY N/A 03/26/2018   Dr. Lovena Neighbours: Esophagitis, mild Schatzki ring, gastric ulcer with multiple satellite erosions.  Esophagus was dilated.  Gastric biopsies were benign.  No H. pylori.  Recommend 46-month surveillance EGD.   ESOPHAGOGASTRODUODENOSCOPY N/A 08/13/2018   Procedure: ESOPHAGOGASTRODUODENOSCOPY (EGD);  Surgeon: Corbin Ade, MD;  [facility];  Service: Endoscopy;  Laterality: N/A;  12:15pm   MALONEY DILATION N/A 03/26/2018   Procedure: Elease Hashimoto DILATION;  Surgeon: Corbin Ade, MD;  [facility];  Service: Endoscopy;   Laterality: N/A;   MALONEY DILATION N/A 08/13/2018   Procedure: Elease Hashimoto DILATION;  Surgeon: Corbin Ade, MD;  [facility];  Service: Endoscopy;  Laterality: N/A;    There were no vitals filed for this visit.   Subjective Assessment - 12/03/19 0843    Subjective Hurting a lot today.    Limitations Sitting;Lifting;Standing;Walking;House hold activities    How long can you walk comfortably? unable    Patient Stated Goals to get better    Pain Score 10-Worst pain ever    Pain Location Back    Multiple Pain Sites Yes   radicular down bilateral legs to knees.          [facility] Adult PT Treatment/Exercise - 12/03/19 0001      Exercises   Exercises Lumbar      Lumbar Exercises: Standing   Other Standing Lumbar Exercises lumbar extension x10      Lumbar Exercises: Supine   Ab Set 10 reps;5 seconds    Pelvic Tilt 10 reps;5 seconds    Bent Knee Raise 10 reps;2 seconds    Bridge Non-compliant;5 reps;3 seconds    Bridge Limitations increased pain      Lumbar Exercises: Prone   Other Prone Lumbar Exercises prone lying 2 minutes; prone on elbows 2 minutes, prone press up x10 limited by strength      Manual Therapy   Manual  Therapy Joint mobilization;Soft tissue mobilization    Manual therapy comments completed separate from all other interventions    Joint Mobilization "rocking" general side-bending of lumbar spine in prone    Soft tissue mobilization lumbar paraspinals, R > L gluteals              PT Education - 12/03/19 0844    Education Details Reviewed eval and goals. Discussed purpose and technique of interventions throughout session. Advanced HEP.            PT Short Term Goals - 12/01/19 1218      PT SHORT TERM GOAL #1   Title Patient will be independent with HEP in order to improve functional outcomes.    Time 3    Period Weeks    Status New    Target Date 12/22/19      PT SHORT TERM GOAL #2   Title Patient will report at least 25% improvement in  symptoms for improved quality of life.    Time 3    Period Weeks    Status New    Target Date 12/22/19             PT Long Term Goals - 12/01/19 1218      PT LONG TERM GOAL #1   Title Patient will report at least 75% improvement in symptoms for improved quality of life.    Time 6    Period Weeks    Status New    Target Date 01/12/20      PT LONG TERM GOAL #2   Title Patient will improve FOTO score by at least 10 points in order to indicate improved tolerance to activity.    Time 6    Period Weeks    Status New    Target Date 01/12/20      PT LONG TERM GOAL #3   Title Patient will be able to complete 5x STS in under 11.4 seconds in order to reduce the risk of falls.    Time 6    Period Weeks    Status New    Target Date 01/12/20      PT LONG TERM GOAL #4   Title Patient will be able to ambulate at least 226 feet in to demonstrate improved gait speed for ambulation.    Time 6    Period Weeks    Status New    Target Date 01/12/20            Plan - 12/03/19 0845    Clinical Impression Statement Reviewed evaluation and goals. Patient presented with trunk flexion at beginning of session. Session focused on pain reduction, core stabilization initiation and initiation of HEP for pain management. Prone lying, prone on elbows, prone press ups (lacking upper extremity strength) and standing back extensions for radicular symptom and pain management. Core stabilization of ab set, pelvic tilt, bent knee raise and supine bridge (increased pain complaints). HEP initiated with ab set, pelvic tilt, prone lying, prone on elbows, and prone press ups. Patient with improved posture and 6/10 pain complaints at end of session (was 10/10 at beginning of session. Patient will benefit from skilled physical therapy in order to reduce pain and improve function and reduce impairment.    Personal Factors and Comorbidities Comorbidity 3+;Behavior Pattern;Fitness;Past/Current Experience;Time since  onset of injury/illness/exacerbation    Comorbidities Anxiety, Arthritis, Back pain, Chronic Obstructive PulmonaryDisease, High Blood Pressure    Examination-Activity Limitations Bathing;Bed Mobility;Bend;Caring for Others;Carry;Dressing;Hygiene/Grooming;Lift;Locomotion Level;Sit;Sleep;Squat;Stairs;Stand;Transfers  Examination-Participation Restrictions Cleaning;Community Activity;Yard Work;Volunteer;Shop;Occupation    Stability/Clinical Decision Making Evolving/Moderate complexity    Rehab Potential Fair    PT Frequency 2x / week    PT Duration 6 weeks    PT Treatment/Interventions ADLs/Self Care Home Management;Aquatic Therapy;Biofeedback;Cryotherapy;Electrical Stimulation;Iontophoresis 4mg /ml Dexamethasone;Moist Heat;Traction;Ultrasound;DME Instruction;Gait training;Stair training;Functional mobility training;Therapeutic activities;Therapeutic exercise;Balance training;Neuromuscular re-education;Patient/family education;Orthotic Fit/Training;Manual techniques;Passive range of motion;Dry needling;Energy conservation;Splinting;Spinal Manipulations;Joint Manipulations    PT Next Visit Plan begin TRA activation exercises, glute/LE strengthening, possibly manual for pain, and improve lumbar mobility as able    PT Home Exercise Plan 12/03/19 - pronelying, prone on elbows, prone press ups, ab set, pelvic tilt    Consulted and Agree with Plan of Care Patient           Patient will benefit from skilled therapeutic intervention in order to improve the following deficits and impairments:  Abnormal gait, Decreased range of motion, Difficulty walking, Cardiopulmonary status limiting activity, Decreased endurance, Increased muscle spasms, Decreased activity tolerance, Impaired perceived functional ability, Pain, Decreased balance, Improper body mechanics, Decreased mobility, Decreased strength, Postural dysfunction  Visit Diagnosis: Low back pain, unspecified back pain laterality, unspecified chronicity,  unspecified whether sciatica present  Muscle weakness (generalized)  Other abnormalities of gait and mobility  Other symptoms and signs involving the musculoskeletal system     Problem List Patient Active Problem List   Diagnosis Date Noted   Gastric ulcer 05/26/2018   Abdominal pain, epigastric 01/30/2018   Abnormal weight loss 01/30/2018   Dysphagia 01/30/2018   Loss of appetite 01/30/2018   Nonpulsatile abdominal mass 01/30/2018   Influenza A 02/14/2017   Hypokalemia 02/14/2017   Hyperglycemia, drug-induced 08/13/2015   COPD with acute exacerbation (HCC) 08/12/2015   Acute respiratory distress 08/12/2015   Tobacco abuse 05/25/2014    07/25/2014 D. Hartnett-Rands, MS, PT Per Britta [facility]cc[facility] 12/03/2019, 10:51 AM  [place] [facility] [address] [place], [place], [place] Phone: [phone]   Fax:  [phone]  Name: Cassandra Montoya MRN: [name] Date of Birth: [birth year]

## 2019-12-03 NOTE — Patient Instructions (Addendum)
Abdominal Lying    Lie on abdomen, pillows as needed under hips, ankles, forehead and chest. Rest forehead on hands or on folded towel as needed and/or instructed by your therapist. Lie 2-3___ minutes. Repeat 2-4 times per day.  Copyright  VHI. All rights reserved.   On Elbows (Prone)    Rise up on elbows as high as possible, keeping hips on floor and buttock and back muscles relaxed. Hold 2-3 minutes.  Do _1___ sets per session. Do _2-4___ sessions per day.  http://orth.exer.us/93   Copyright  VHI. All rights reserved.   Press-Up    Press upper body upward, keeping hips in contact with floor. Keep lower back and buttocks relaxed. Hold _1-2___ seconds. Repeat _10___ times per set. Do _1___ sets per session. Do 2-4__ sessions per day.  http://orth.exer.us/95   Copyright  VHI. All rights reserved.   Trunk Extension    Standing, place back of open hands on low back. Straighten spine then arch the back and move shoulders back. Repeat 10_ times per session. Do 4-6_ sessions per day.  Copyright  VHI. All rights reserved.   Isometric Abdominal    Lying on back with knees bent, tighten stomach by pressing elbows down. Hold _5___ seconds. Repeat _10___ times per set. Do 1____ sets per session. Do _1___ sessions per day.  http://orth.exer.us/1087   Copyright  VHI. All rights reserved.   Pelvic Tilt: Posterior - Legs Bent (Supine)    Tighten stomach and flatten back by rolling pelvis down. Hold _5___ seconds. Relax. Repeat _10__ times per set. Do 1____ sets per session. Do _1___ sessions per day.  http://orth.exer.us/203   Copyright  VHI. All rights reserved.

## 2019-12-08 ENCOUNTER — Ambulatory Visit (HOSPITAL_COMMUNITY): Payer: PRIVATE HEALTH INSURANCE | Admitting: Physical Therapy

## 2019-12-08 ENCOUNTER — Telehealth (HOSPITAL_COMMUNITY): Payer: Self-pay | Admitting: Physical Therapy

## 2019-12-08 NOTE — Telephone Encounter (Signed)
pt called to cx this morning due to she did not feel well

## 2019-12-14 ENCOUNTER — Other Ambulatory Visit: Payer: Self-pay

## 2019-12-14 ENCOUNTER — Ambulatory Visit (HOSPITAL_COMMUNITY): Payer: PRIVATE HEALTH INSURANCE

## 2019-12-14 ENCOUNTER — Encounter (HOSPITAL_COMMUNITY): Payer: Self-pay

## 2019-12-14 DIAGNOSIS — M6281 Muscle weakness (generalized): Secondary | ICD-10-CM

## 2019-12-14 DIAGNOSIS — M545 Low back pain, unspecified: Secondary | ICD-10-CM

## 2019-12-14 DIAGNOSIS — R2689 Other abnormalities of gait and mobility: Secondary | ICD-10-CM

## 2019-12-14 DIAGNOSIS — R29898 Other symptoms and signs involving the musculoskeletal system: Secondary | ICD-10-CM

## 2019-12-14 NOTE — Patient Instructions (Addendum)
Hip Flexor Stretch    Lying on back near edge of bed, bend one leg, foot flat. Hang other leg over edge, relaxed, thigh resting entirely on bed for _1-2___ minutes. Repeat 1____ times. Do _1___ sessions per day. Advanced Exercise: Bend knee back keeping thigh in contact with bed.  http://gt2.exer.us/347   Copyright  VHI. All rights reserved.   Low Back Stretch: One leg (Supine)    Lying on back, bring one knee toward chest by pulling gently behind knee. Can use a towel behind knee to bring knee up. Hold _30___ seconds. Repeat 2 times. Repeat with other leg.  Copyright  VHI. All rights reserved.   Knee Roll    Lying on back, with knees bent and feet flat on floor, arms outstretched to sides, slowly roll both knees to side, hold 5 seconds. Back to starting position, hold 5 seconds. Then to opposite side, hold 5 seconds. Return to starting position. Keep shoulders and arms in contact with floor.   Copyright  VHI. All rights reserved.

## 2019-12-14 NOTE — Therapy (Signed)
[facility] [facility] [address] [place], [place], [address] Phone: [phone]   Fax:  [phone]  Physical Therapy Treatment  Patient Details  Name: Cassandra Montoya MRN: [id number] Date of Birth: Dec 25, 1955 Referring Provider (PT): Darreld Mclean MD   Encounter Date: 12/14/2019   PT End of Session - 12/14/19 0843    Visit Number 3    Number of Visits 12    Date for PT Re-Evaluation 01/12/20    Authorization Type Ambetter (no vl, auth required)    Progress Note Due on Visit 10    PT Start Time 0845    PT Stop Time 0929    PT Time Calculation (min) 44 min    Activity Tolerance Patient tolerated treatment well    Behavior During Therapy [facility] for tasks assessed/performed           Past Medical History:  Diagnosis Date  . Asthma   . COPD (chronic obstructive pulmonary disease) (HCC)   . Ovarian cyst    Right ovarian cyst,   . Pneumonia     Past Surgical History:  Procedure Laterality Date  . BIOPSY  03/26/2018   Procedure: BIOPSY;  Surgeon: Corbin Ade, MD;  [facility];  Service: Endoscopy;;  gastric  . BIOPSY  08/13/2018   Procedure: BIOPSY;  Surgeon: Corbin Ade, MD;  [facility];  Service: Endoscopy;;  gastric  . COLONOSCOPY N/A 03/26/2018   Dr. 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  10-year screening colonoscopy  . ESOPHAGOGASTRODUODENOSCOPY N/A 03/26/2018   Dr. Lovena Neighbours: Esophagitis, mild Schatzki ring, gastric ulcer with multiple satellite erosions.  Esophagus was dilated.  Gastric biopsies were benign.  No H. pylori.  Recommend 74-month surveillance EGD.  Marland Kitchen ESOPHAGOGASTRODUODENOSCOPY N/A 08/13/2018   Procedure: ESOPHAGOGASTRODUODENOSCOPY (EGD);  Surgeon: Corbin Ade, MD;  [facility];  Service: Endoscopy;  Laterality: N/A;  12:15pm  . MALONEY DILATION N/A 03/26/2018   Procedure: Elease Hashimoto DILATION;  Surgeon: Corbin Ade, MD;  [facility];  Service: Endoscopy;   Laterality: N/A;  Elease Hashimoto DILATION N/A 08/13/2018   Procedure: Elease Hashimoto DILATION;  Surgeon: Corbin Ade, MD;  [facility];  Service: Endoscopy;  Laterality: N/A;    There were no vitals filed for this visit.   Subjective Assessment - 12/14/19 0842    Subjective Hurting a lot today again. Her usual. Has tried her HEP but it doesn't seem to help bring her pain down much yet. Felt better after last session for a while but then her pain went back up again.    Limitations Sitting;Lifting;Standing;Walking;House hold activities    How long can you walk comfortably? unable    Patient Stated Goals to get better    Currently in Pain? Yes    Pain Score 9     Pain Location Back    Pain Orientation Posterior;Right;Left    Pain Descriptors / Indicators Sharp;Shooting    Pain Radiating Towards posterior leg to lateral ankle            [facility] Adult PT Treatment/Exercise - 12/14/19 0001      Bed Mobility   Bed Mobility --   log roll with cuing     Lumbar Exercises: Stretches   Single Knee to Chest Stretch Right;Left;2 reps;30 seconds    Single Knee to Chest Stretch Limitations towel behind knee    Lower Trunk Rotation 5 reps;10 seconds    Lower  Trunk Rotation Limitations bilateral hook-lying    Hip Flexor Stretch Right;Left;2 reps;30 seconds    Hip Flexor Stretch Limitations supine with towel behind knee      Lumbar Exercises: Supine   Ab Set 10 reps;5 seconds    Pelvic Tilt 10 reps;5 seconds      Lumbar Exercises: Prone   Other Prone Lumbar Exercises prone lying 2 minutes; prone on elbows 2 minutes      Manual Therapy   Manual Therapy Joint mobilization;Soft tissue mobilization    Manual therapy comments completed separate from all other interventions    Joint Mobilization "rocking" general side-bending of lumbar spine in prone; Grade III central and unilateral PAs x10 each segment    Soft tissue mobilization lumbar paraspinals, bilateral quadratus lumborum              PT Education - 12/14/19 1223    Education Details Discussed purpose and technique of interventions throughout session. Advanced HEP.    Person(s) Educated Patient    Methods Explanation;Handout    Comprehension Verbalized understanding            PT Short Term Goals - 12/14/19 0843      PT SHORT TERM GOAL #1   Title Patient will be independent with HEP in order to improve functional outcomes.    Time 3    Period Weeks    Status On-going    Target Date 12/22/19      PT SHORT TERM GOAL #2   Title Patient will report at least 25% improvement in symptoms for improved quality of life.    Time 3    Period Weeks    Status On-going    Target Date 12/22/19             PT Long Term Goals - 12/14/19 0844      PT LONG TERM GOAL #1   Title Patient will report at least 75% improvement in symptoms for improved quality of life.    Time 6    Period Weeks    Status On-going      PT LONG TERM GOAL #2   Title Patient will improve FOTO score by at least 10 points in order to indicate improved tolerance to activity.    Time 6    Period Weeks    Status On-going      PT LONG TERM GOAL #3   Title Patient will be able to complete 5x STS in under 11.4 seconds in order to reduce the risk of falls.    Time 6    Period Weeks    Status On-going      PT LONG TERM GOAL #4   Title Patient will be able to ambulate at least 226 feet in to demonstrate improved gait speed for ambulation.    Time 6    Period Weeks    Status On-going             Plan - 12/14/19 5462    Clinical Impression Statement Patient presented with trunk flexion at beginning of session. Session focused on pain reduction, core stabilization and stretching exercises and advancement of HEP for pain management. Added lower trunk rotation, single knee to chest and hip flexor stretches to therapeutic exercises and HEP. Manual therapy in prone for soft tissue mobilization of bilateral quadratus lumborum, general  lumbar spine rotation and side-bending with "rocking" and segmental joint mobilization with central and unilateral PAs T12-L5 Grade III. Pain reduction with single knee to  chest and hip flexor stretches bilaterally. Reviewed log roll. Patient with pain complaints while performing small bridges to move in bed. Upon standing, patient's pain increased again and patient assumed trunk flexed positioning. Further flexibility of bilateral hip flexors may be beneficial in reducing patient overall pain complaints. Patient would benefit from continues physical therapy in order to reduce pain and improve function and reduce impairment.    Personal Factors and Comorbidities Comorbidity 3+;Behavior Pattern;Fitness;Past/Current Experience;Time since onset of injury/illness/exacerbation    Comorbidities Anxiety, Arthritis, Back pain, Chronic Obstructive PulmonaryDisease, High Blood Pressure    Examination-Activity Limitations Bathing;Bed Mobility;Bend;Caring for Others;Carry;Dressing;Hygiene/Grooming;Lift;Locomotion Level;Sit;Sleep;Squat;Stairs;Stand;Transfers    Examination-Participation Restrictions Cleaning;Community Activity;Yard Work;Volunteer;Shop;Occupation    Stability/Clinical Decision Making Evolving/Moderate complexity    Rehab Potential Fair    PT Frequency 2x / week    PT Duration 6 weeks    PT Treatment/Interventions ADLs/Self Care Home Management;Aquatic Therapy;Biofeedback;Cryotherapy;Electrical Stimulation;Iontophoresis 4mg /ml Dexamethasone;Moist Heat;Traction;Ultrasound;DME Instruction;Gait training;Stair training;Functional mobility training;Therapeutic activities;Therapeutic exercise;Balance training;Neuromuscular re-education;Patient/family education;Orthotic Fit/Training;Manual techniques;Passive range of motion;Dry needling;Energy conservation;Splinting;Spinal Manipulations;Joint Manipulations    PT Next Visit Plan begin TRA activation exercises, glute/LE strengthening, possibly manual for pain,  and improve lumbar stability/mobility as able    PT Home Exercise Plan 12/03/19 - pronelying, prone on elbows, prone press ups, ab set, pelvic tilt; 12/14/19 - LTR, SKC, supine hip flexor stretch    Consulted and Agree with Plan of Care Patient           Patient will benefit from skilled therapeutic intervention in order to improve the following deficits and impairments:  Abnormal gait, Decreased range of motion, Difficulty walking, Cardiopulmonary status limiting activity, Decreased endurance, Increased muscle spasms, Decreased activity tolerance, Impaired perceived functional ability, Pain, Decreased balance, Improper body mechanics, Decreased mobility, Decreased strength, Postural dysfunction  Visit Diagnosis: Low back pain, unspecified back pain laterality, unspecified chronicity, unspecified whether sciatica present  Muscle weakness (generalized)  Other abnormalities of gait and mobility  Other symptoms and signs involving the musculoskeletal system  Problem List Patient Active Problem List   Diagnosis Date Noted  . Gastric ulcer 05/26/2018  . Abdominal pain, epigastric 01/30/2018  . Abnormal weight loss 01/30/2018  . Dysphagia 01/30/2018  . Loss of appetite 01/30/2018  . Nonpulsatile abdominal mass 01/30/2018  . Influenza A 02/14/2017  . Hypokalemia 02/14/2017  . Hyperglycemia, drug-induced 08/13/2015  . COPD with acute exacerbation (HCC) 08/12/2015  . Acute respiratory distress 08/12/2015  . Tobacco abuse 05/25/2014    07/25/2014. Hartnett-Rands, MS, PT Per Katina Dung [facility] 12/14/2019, 12:28 PM  [facility] [address] [place], [place], [place] Phone: [phone]   Fax:  [phone]  Name: ALIANAH LOFTON MRN: [name] Date of Birth: 04-06-1955

## 2019-12-15 ENCOUNTER — Encounter: Payer: Self-pay | Admitting: Orthopaedic Surgery

## 2019-12-15 ENCOUNTER — Ambulatory Visit (INDEPENDENT_AMBULATORY_CARE_PROVIDER_SITE_OTHER): Payer: PRIVATE HEALTH INSURANCE | Admitting: Orthopaedic Surgery

## 2019-12-15 VITALS — BP 147/81 | HR 84 | Ht <= 58 in | Wt 161.0 lb

## 2019-12-15 DIAGNOSIS — M79605 Pain in left leg: Secondary | ICD-10-CM | POA: Diagnosis not present

## 2019-12-15 DIAGNOSIS — M79604 Pain in right leg: Secondary | ICD-10-CM

## 2019-12-15 DIAGNOSIS — M545 Low back pain, unspecified: Secondary | ICD-10-CM | POA: Diagnosis not present

## 2019-12-15 MED ORDER — HYDROCODONE-ACETAMINOPHEN 5-325 MG PO TABS: ORAL_TABLET | ORAL | 0 refills | Status: DC

## 2019-12-15 NOTE — Progress Notes (Signed)
Patient UK:Cassandra Montoya, female DOB:1955/04/17, 64 y.o. YHC:[id number]  Chief Complaint  Patient presents with   Back Pain    f/u, same but really bothersome, PT not helping yet    HPI  Cassandra Montoya is a 64 y.o. female who has lower back pain that is not improving.  She has done PT.  It has not helped.  I had ordered MRI in past but insurance wanted PT first.  PT did not help. I will re-order MRI.  I will give pain medicine.   Body mass index is 34.84 kg/m.  ROS  Review of Systems  Constitutional: Positive for activity change.  Respiratory: Positive for shortness of breath and wheezing.   Musculoskeletal: Positive for arthralgias, back pain and gait problem.  All other systems reviewed and are negative.   All other systems reviewed and are negative.  The following is a summary of the past history medically, past history surgically, known current medicines, social history and family history.  This information is gathered electronically by the computer from prior information and documentation.  I review this each visit and have found including this information at this point in the chart is beneficial and informative.    Past Medical History:  Diagnosis Date   Asthma    COPD (chronic obstructive pulmonary disease) (HCC)    Ovarian cyst    Right ovarian cyst,    Pneumonia     Past Surgical History:  Procedure Laterality Date   BIOPSY  03/26/2018   Procedure: BIOPSY;  Surgeon: Corbin Ade, MD;  [facility];  Service: Endoscopy;;  gastric   BIOPSY  08/13/2018   Procedure: BIOPSY;  Surgeon: Corbin Ade, MD;  [facility];  Service: Endoscopy;;  gastric   COLONOSCOPY N/A 03/26/2018   Dr. 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  10-year screening colonoscopy   ESOPHAGOGASTRODUODENOSCOPY N/A 03/26/2018   Dr. Lovena Neighbours: Esophagitis, mild Schatzki ring, gastric ulcer with multiple satellite erosions.  Esophagus was dilated.  Gastric  biopsies were benign.  No H. pylori.  Recommend 17-month surveillance EGD.   ESOPHAGOGASTRODUODENOSCOPY N/A 08/13/2018   Procedure: ESOPHAGOGASTRODUODENOSCOPY (EGD);  Surgeon: Corbin Ade, MD;  [facility];  Service: Endoscopy;  Laterality: N/A;  12:15pm   MALONEY DILATION N/A 03/26/2018   Procedure: Elease Hashimoto DILATION;  Surgeon: Corbin Ade, MD;  [facility];  Service: Endoscopy;  Laterality: N/A;   MALONEY DILATION N/A 08/13/2018   Procedure: Elease Hashimoto DILATION;  Surgeon: Corbin Ade, MD;  [facility];  Service: Endoscopy;  Laterality: N/A;    Family History  Problem Relation Age of Onset   Stroke Brother    Hypertension Mother    Colon cancer Neg Hx     Social History Social History   Tobacco Use   Smoking status: Current Every Day Smoker    Packs/day: 0.50    Types: Cigarettes   Smokeless tobacco: Never Used  Vaping Use   Vaping Use: Never used  Substance Use Topics   Alcohol use: No   Drug use: No    No Known Allergies  Current Outpatient Medications  Medication Sig Dispense Refill   albuterol (PROVENTIL HFA;VENTOLIN HFA) 108 (90 Base) MCG/ACT inhaler Inhale 1-2 puffs into the lungs every 6 (six) hours as needed for wheezing or shortness of breath.     albuterol (PROVENTIL) (2.5 MG/3ML) 0.083% nebulizer solution Take 3 mLs (2.5 mg total) by nebulization every 4 (four)  hours as needed for wheezing or shortness of breath. 75 mL 12   aspirin EC 81 MG tablet Take 81 mg by mouth daily.     Aspirin-Salicylamide-Caffeine (BC HEADACHE POWDER PO) Take 1 packet by mouth daily as needed (pain).     escitalopram (LEXAPRO) 5 MG tablet      feeding supplement, ENSURE ENLIVE, (ENSURE ENLIVE) LIQD Take 237 mLs by mouth 2 (two) times daily between meals. (Patient taking differently: Take 237 mLs by mouth 2 (two) times a week. ) 237 mL 12   Fluticasone-Umeclidin-Vilant (TRELEGY ELLIPTA) 100-62.5-25 MCG/INH AEPB Inhale 1 puff into  the lungs daily.      gabapentin (NEURONTIN) 300 MG capsule Take 300 mg by mouth daily.     pantoprazole (PROTONIX) 40 MG tablet Take 1 tablet (40 mg total) by mouth daily before breakfast. 30 tablet 5   traMADol (ULTRAM) 50 MG tablet Take 50 mg by mouth every 6 (six) hours as needed.     HYDROcodone-acetaminophen (NORCO/VICODIN) 5-325 MG tablet One tablet every six hours for pain.  Limit 7 days. 28 tablet 0   No current facility-administered medications for this visit.     Physical Exam  Blood pressure (!) 147/81, pulse 84, height 4\' 9"  (1.448 m), weight 161 lb (73 kg).  Constitutional: overall normal hygiene, normal nutrition, well developed, normal grooming, normal body habitus. Assistive device:none  Musculoskeletal: gait and station Limp none, muscle tone and strength are normal, no tremors or atrophy is present.  .  Neurological: coordination overall normal.  Deep tendon reflex/nerve stretch intact.  Sensation normal.  Cranial nerves II-XII intact.   Skin:   Normal overall no scars, lesions, ulcers or rashes. No psoriasis.  Psychiatric: Alert and oriented x 3.  Recent memory intact, remote memory unclear.  Normal mood and affect. Well groomed.  Good eye contact.  Cardiovascular: overall no swelling, no varicosities, no edema bilaterally, normal temperatures of the legs and arms, no clubbing, cyanosis and good capillary refill.  Lymphatic: palpation is normal.  Spine/Pelvis examination:  Inspection:  Overall, sacoiliac joint benign and hips nontender; without crepitus or defects.   Thoracic spine inspection: Alignment normal without kyphosis present   Lumbar spine inspection:  Alignment  with normal lumbar lordosis, without scoliosis apparent.   Thoracic spine palpation:  without tenderness of spinal processes   Lumbar spine palpation: without tenderness of lumbar area; without tightness of lumbar muscles    Range of Motion:   Lumbar flexion, forward flexion is  normal without pain or tenderness    Lumbar extension is full without pain or tenderness   Left lateral bend is normal without pain or tenderness   Right lateral bend is normal without pain or tenderness   Straight leg raising is normal  Strength & tone: normal   Stability overall normal stability All other systems reviewed and are negative   The patient has been educated about the nature of the problem(s) and counseled on treatment options.  The patient appeared to understand what I have discussed and is in agreement with it. Encounter Diagnosis  Name Primary?   Lumbar pain with radiation down both legs Yes    PLAN Call if any problems.  Precautions discussed.  Continue current medications.   Return to clinic 2 weeks   I have reviewed the [facility] Controlled Substance Reporting System web site prior to prescribing narcotic medicine for this patient.   Get MRI lumbar spine.  Electronically Signed [facility], MD 10/26/20219:23 AM

## 2019-12-17 ENCOUNTER — Ambulatory Visit (HOSPITAL_COMMUNITY): Payer: PRIVATE HEALTH INSURANCE | Admitting: Physical Therapy

## 2019-12-17 ENCOUNTER — Telehealth (HOSPITAL_COMMUNITY): Payer: Self-pay | Admitting: Physical Therapy

## 2019-12-17 NOTE — Telephone Encounter (Signed)
pt cancelled appt because she has to go to the MD

## 2019-12-23 ENCOUNTER — Telehealth (HOSPITAL_COMMUNITY): Payer: Self-pay | Admitting: Physical Therapy

## 2019-12-23 ENCOUNTER — Ambulatory Visit (HOSPITAL_COMMUNITY): Payer: PRIVATE HEALTH INSURANCE | Admitting: Physical Therapy

## 2019-12-23 NOTE — Telephone Encounter (Signed)
pt called to cx this appt due to she is not feeling well 

## 2019-12-25 ENCOUNTER — Telehealth (HOSPITAL_COMMUNITY): Payer: Self-pay

## 2019-12-25 ENCOUNTER — Ambulatory Visit (HOSPITAL_COMMUNITY): Payer: PRIVATE HEALTH INSURANCE

## 2019-12-25 NOTE — Telephone Encounter (Signed)
pt called to cx this appt due to she is still sick problems with her copd.

## 2019-12-29 ENCOUNTER — Ambulatory Visit: Payer: PRIVATE HEALTH INSURANCE | Admitting: Orthopaedic Surgery

## 2019-12-29 ENCOUNTER — Ambulatory Visit (HOSPITAL_COMMUNITY): Payer: PRIVATE HEALTH INSURANCE | Attending: Orthopaedic Surgery | Admitting: Physical Therapy

## 2019-12-29 ENCOUNTER — Telehealth (HOSPITAL_COMMUNITY): Payer: Self-pay | Admitting: Physical Therapy

## 2019-12-29 NOTE — Telephone Encounter (Signed)
Patient no show, patient did not answer and unable to leave message as patient does not have a mailbox set up.   9:01 AM, 12/29/19 Wyman Songster PT, DPT Physical Therapist at [facility]

## 2019-12-31 ENCOUNTER — Ambulatory Visit (HOSPITAL_COMMUNITY): Payer: PRIVATE HEALTH INSURANCE | Admitting: Physical Therapy

## 2019-12-31 ENCOUNTER — Telehealth: Payer: Self-pay | Admitting: Orthopaedic Surgery

## 2019-12-31 ENCOUNTER — Telehealth (HOSPITAL_COMMUNITY): Payer: Self-pay | Admitting: Physical Therapy

## 2019-12-31 NOTE — Telephone Encounter (Signed)
Patient called for refill: HYDROcodone-acetaminophen (NORCO/VICODIN) 5-325 MG tablet 28 tablet  -[facility]  -patient aware of MRI appointment and follow up appointment scheduled

## 2019-12-31 NOTE — Telephone Encounter (Signed)
Patient no show #2, patient did not answer and unable to leave message as patient does not have a mailbox set up.  9:04 AM, 12/31/19 Wyman Songster PT, DPT Physical Therapist at [facility]

## 2020-01-01 MED ORDER — HYDROCODONE-ACETAMINOPHEN 5-325 MG PO TABS: ORAL_TABLET | ORAL | 0 refills | Status: DC

## 2020-01-04 ENCOUNTER — Telehealth: Payer: Self-pay | Admitting: Orthopaedic Surgery

## 2020-01-04 NOTE — Telephone Encounter (Signed)
Patient is going for MRI and says she will need something for anxiety before doing this.  Would you call something in to [facility]?  Thanks

## 2020-01-05 ENCOUNTER — Ambulatory Visit (HOSPITAL_COMMUNITY): Payer: PRIVATE HEALTH INSURANCE

## 2020-01-05 ENCOUNTER — Telehealth (HOSPITAL_COMMUNITY): Payer: Self-pay

## 2020-01-05 MED ORDER — DIAZEPAM 10 MG PO TABS: 10.0000 mg | ORAL_TABLET | Freq: Once | ORAL | 0 refills | Status: AC

## 2020-01-05 NOTE — Telephone Encounter (Signed)
Done. Patient aware.

## 2020-01-05 NOTE — Telephone Encounter (Signed)
No show #3.  Called with no answer and voice mail box has not been set up.  Per no show policy, cancelled all remaining apts.  Becky Sax, LPTA/CLT; [facility] [phone]

## 2020-01-06 ENCOUNTER — Ambulatory Visit (HOSPITAL_COMMUNITY)
Admission: RE | Admit: 2020-01-06 | Discharge: 2020-01-06 | Disposition: A | Discharge status: Home / Self Care | Payer: PRIVATE HEALTH INSURANCE | Source: Ambulatory Visit | Attending: Orthopaedic Surgery | Admitting: Orthopaedic Surgery

## 2020-01-06 ENCOUNTER — Other Ambulatory Visit: Payer: Self-pay

## 2020-01-06 DIAGNOSIS — M545 Low back pain, unspecified: Secondary | ICD-10-CM | POA: Diagnosis present

## 2020-01-06 DIAGNOSIS — M79605 Pain in left leg: Secondary | ICD-10-CM | POA: Diagnosis present

## 2020-01-06 DIAGNOSIS — M79604 Pain in right leg: Secondary | ICD-10-CM | POA: Insufficient documentation

## 2020-01-07 ENCOUNTER — Ambulatory Visit (HOSPITAL_COMMUNITY): Payer: PRIVATE HEALTH INSURANCE

## 2020-01-11 ENCOUNTER — Encounter (HOSPITAL_COMMUNITY): Payer: PRIVATE HEALTH INSURANCE

## 2020-01-13 ENCOUNTER — Encounter (HOSPITAL_COMMUNITY): Payer: PRIVATE HEALTH INSURANCE | Admitting: Physical Therapy

## 2020-01-18 ENCOUNTER — Other Ambulatory Visit: Payer: Self-pay | Admitting: Orthopaedic Surgery

## 2020-01-18 MED ORDER — HYDROCODONE-ACETAMINOPHEN 5-325 MG PO TABS: ORAL_TABLET | ORAL | 0 refills | Status: DC

## 2020-01-18 NOTE — Telephone Encounter (Signed)
Patient is requesting pain medicine ... She has to go back to work and she is in a lot of pain ...  Advised her she has an appt with Dr. Hilda Lias this week and she said she needs something before then to get her thru the week.   HYDROcodone-Acetaminophen (Tab) NORCO/VICODIN 5-325 MG One tablet every six hours for pain. Limit 7 days.    call was dropped before I could find out what pharmacy I tried to call patient back and voicemail is not set up

## 2020-01-18 NOTE — Addendum Note (Signed)
Addended byCaffie Damme on: 01/18/2020 04:15 PM   Modules accepted: Orders

## 2020-01-18 NOTE — Telephone Encounter (Signed)
Per phone note entered for medication refill: patient verified [employer]. Please proceed with request as noted

## 2020-01-18 NOTE — Telephone Encounter (Signed)
Will send request once pharmacy verified

## 2020-01-21 ENCOUNTER — Encounter: Payer: Self-pay | Admitting: Orthopaedic Surgery

## 2020-01-21 ENCOUNTER — Ambulatory Visit (INDEPENDENT_AMBULATORY_CARE_PROVIDER_SITE_OTHER): Payer: PRIVATE HEALTH INSURANCE | Admitting: Orthopaedic Surgery

## 2020-01-21 ENCOUNTER — Other Ambulatory Visit: Payer: Self-pay

## 2020-01-21 VITALS — Ht <= 58 in | Wt 163.0 lb

## 2020-01-21 DIAGNOSIS — M79605 Pain in left leg: Secondary | ICD-10-CM | POA: Diagnosis not present

## 2020-01-21 DIAGNOSIS — M545 Low back pain, unspecified: Secondary | ICD-10-CM | POA: Diagnosis not present

## 2020-01-21 DIAGNOSIS — M79604 Pain in right leg: Secondary | ICD-10-CM | POA: Diagnosis not present

## 2020-01-21 MED ORDER — HYDROCODONE-ACETAMINOPHEN 5-325 MG PO TABS: ORAL_TABLET | ORAL | 0 refills | Status: DC

## 2020-01-21 NOTE — Progress Notes (Signed)
Patient Cassandra Montoya, female DOB:08/01/1955, 64 y.o. LGX:[id number]  Chief Complaint  Patient presents with  . Back Pain    HPI  Cassandra Montoya is a 64 y.o. female who has lumbar pain.  She went to have MRI and it showed: IMPRESSION: 1. Disc and facet degeneration especially at L3-4 and below where there is mild listheses. 2. Spinal stenosis that is advanced at L4-5 and mild-to-moderate at L3-4. 3. L5-S1 asymmetric left subarticular recess effacement.  I have independently reviewed the MRI.     I have explained the findings to her.  I will have her see Dr. Alvester Morin for consideration of epidural.   Body mass index is 35.27 kg/m.  ROS  Review of Systems  Constitutional: Positive for activity change.  Respiratory: Positive for shortness of breath and wheezing.   Musculoskeletal: Positive for arthralgias, back pain and gait problem.  All other systems reviewed and are negative.   All other systems reviewed and are negative.  The following is a summary of the past history medically, past history surgically, known current medicines, social history and family history.  This information is gathered electronically by the computer from prior information and documentation.  I review this each visit and have found including this information at this point in the chart is beneficial and informative.    Past Medical History:  Diagnosis Date  . Asthma   . COPD (chronic obstructive pulmonary disease) (HCC)   . Ovarian cyst    Right ovarian cyst,   . Pneumonia     Past Surgical History:  Procedure Laterality Date  . BIOPSY  03/26/2018   Procedure: BIOPSY;  Surgeon: Corbin Ade, MD;  [facility];  Service: Endoscopy;;  gastric  . BIOPSY  08/13/2018   Procedure: BIOPSY;  Surgeon: Corbin Ade, MD;  [facility];  Service: Endoscopy;;  gastric  . COLONOSCOPY N/A 03/26/2018   Dr. 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  10-year screening  colonoscopy  . ESOPHAGOGASTRODUODENOSCOPY N/A 03/26/2018   Dr. Lovena Neighbours: Esophagitis, mild Schatzki ring, gastric ulcer with multiple satellite erosions.  Esophagus was dilated.  Gastric biopsies were benign.  No H. pylori.  Recommend 56-month surveillance EGD.  Marland Kitchen ESOPHAGOGASTRODUODENOSCOPY N/A 08/13/2018   Procedure: ESOPHAGOGASTRODUODENOSCOPY (EGD);  Surgeon: Corbin Ade, MD;  [facility];  Service: Endoscopy;  Laterality: N/A;  12:15pm  . MALONEY DILATION N/A 03/26/2018   Procedure: Elease Hashimoto DILATION;  Surgeon: Corbin Ade, MD;  [facility];  Service: Endoscopy;  Laterality: N/A;  Elease Hashimoto DILATION N/A 08/13/2018   Procedure: Elease Hashimoto DILATION;  Surgeon: Corbin Ade, MD;  [facility];  Service: Endoscopy;  Laterality: N/A;    Family History  Problem Relation Age of Onset  . Stroke Brother   . Hypertension Mother   . Colon cancer Neg Hx     Social History Social History   Tobacco Use  . Smoking status: Current Every Day Smoker    Packs/day: 0.50    Types: Cigarettes  . Smokeless tobacco: Never Used  Vaping Use  . Vaping Use: Never used  Substance Use Topics  . Alcohol use: No  . Drug use: No    No Known Allergies  Current Outpatient Medications  Medication Sig Dispense Refill  . albuterol (PROVENTIL HFA;VENTOLIN HFA) 108 (90 Base) MCG/ACT inhaler Inhale 1-2 puffs into the lungs every 6 (six) hours as needed for wheezing or shortness of breath.    Marland Kitchen  albuterol (PROVENTIL) (2.5 MG/3ML) 0.083% nebulizer solution Take 3 mLs (2.5 mg total) by nebulization every 4 (four) hours as needed for wheezing or shortness of breath. 75 mL 12  . aspirin EC 81 MG tablet Take 81 mg by mouth daily.    . Aspirin-Salicylamide-Caffeine (BC HEADACHE POWDER PO) Take 1 packet by mouth daily as needed (pain).    Marland Kitchen escitalopram (LEXAPRO) 5 MG tablet     . feeding supplement, ENSURE ENLIVE, (ENSURE ENLIVE) LIQD Take 237 mLs by mouth 2 (two) times daily between  meals. (Patient taking differently: Take 237 mLs by mouth 2 (two) times a week. ) 237 mL 12  . Fluticasone-Umeclidin-Vilant (TRELEGY ELLIPTA) 100-62.5-25 MCG/INH AEPB Inhale 1 puff into the lungs daily.     Marland Kitchen gabapentin (NEURONTIN) 300 MG capsule Take 300 mg by mouth daily.    Marland Kitchen HYDROcodone-acetaminophen (NORCO/VICODIN) 5-325 MG tablet One tablet every six hours for pain.  Limit 7 days. 28 tablet 0  . pantoprazole (PROTONIX) 40 MG tablet Take 1 tablet (40 mg total) by mouth daily before breakfast. 30 tablet 5  . traMADol (ULTRAM) 50 MG tablet Take 50 mg by mouth every 6 (six) hours as needed.     No current facility-administered medications for this visit.     Physical Exam  Height 4\' 9"  (1.448 m), weight 163 lb (73.9 kg).  Constitutional: overall normal hygiene, normal nutrition, well developed, normal grooming, normal body habitus. Assistive device:none  Musculoskeletal: gait and station Limp none, muscle tone and strength are normal, no tremors or atrophy is present.  .  Neurological: coordination overall normal.  Deep tendon reflex/nerve stretch intact.  Sensation normal.  Cranial nerves II-XII intact.   Skin:   Normal overall no scars, lesions, ulcers or rashes. No psoriasis.  Psychiatric: Alert and oriented x 3.  Recent memory intact, remote memory unclear.  Normal mood and affect. Well groomed.  Good eye contact.  Cardiovascular: overall no swelling, no varicosities, no edema bilaterally, normal temperatures of the legs and arms, no clubbing, cyanosis and good capillary refill.  Lymphatic: palpation is normal.  Spine/Pelvis examination:  Inspection:  Overall, sacoiliac joint benign and hips nontender; without crepitus or defects.   Thoracic spine inspection: Alignment normal without kyphosis present   Lumbar spine inspection:  Alignment  with normal lumbar lordosis, without scoliosis apparent.   Thoracic spine palpation:  without tenderness of spinal processes   Lumbar  spine palpation: without tenderness of lumbar area; without tightness of lumbar muscles    Range of Motion:   Lumbar flexion, forward flexion is normal without pain or tenderness    Lumbar extension is full without pain or tenderness   Left lateral bend is normal without pain or tenderness   Right lateral bend is normal without pain or tenderness   Straight leg raising is normal  Strength & tone: normal   Stability overall normal stability  All other systems reviewed and are negative   The patient has been educated about the nature of the problem(s) and counseled on treatment options.  The patient appeared to understand what I have discussed and is in agreement with it.  Encounter Diagnosis  Name Primary?  . Lumbar pain with radiation down both legs Yes    PLAN Call if any problems.  Precautions discussed.  Continue current medications.   Return to clinic 1 month   To see Dr. .  I have reviewed the [facility] Controlled Substance Reporting System web site prior to prescribing narcotic medicine for  this patient.   Electronically Signed Darreld Mclean, MD 12/2/202110:11 AM

## 2020-02-01 ENCOUNTER — Encounter: Payer: Self-pay | Admitting: Orthopaedic Surgery

## 2020-02-08 ENCOUNTER — Telehealth: Payer: Self-pay

## 2020-02-08 ENCOUNTER — Other Ambulatory Visit: Payer: Self-pay | Admitting: Orthopedic Surgery

## 2020-02-08 MED ORDER — HYDROCODONE-ACETAMINOPHEN 5-325 MG PO TABS: ORAL_TABLET | ORAL | 0 refills | Status: DC

## 2020-02-08 NOTE — Telephone Encounter (Signed)
Patient is requesting refill   Hydrocodone-Acetaminophen 5/325 MG    Take 1 (one) tablet every 6 (six)  hours for pain.  PATIENT USES [facility]

## 2020-02-18 ENCOUNTER — Ambulatory Visit: Payer: PRIVATE HEALTH INSURANCE | Admitting: Orthopaedic Surgery

## 2020-02-25 ENCOUNTER — Other Ambulatory Visit: Payer: Self-pay

## 2020-02-25 ENCOUNTER — Encounter: Payer: Self-pay | Admitting: Orthopaedic Surgery

## 2020-02-25 ENCOUNTER — Ambulatory Visit (INDEPENDENT_AMBULATORY_CARE_PROVIDER_SITE_OTHER): Payer: PRIVATE HEALTH INSURANCE | Admitting: Orthopaedic Surgery

## 2020-02-25 VITALS — BP 185/95 | HR 73 | Ht <= 58 in | Wt 163.0 lb

## 2020-02-25 DIAGNOSIS — M79605 Pain in left leg: Secondary | ICD-10-CM | POA: Diagnosis not present

## 2020-02-25 DIAGNOSIS — M79604 Pain in right leg: Secondary | ICD-10-CM

## 2020-02-25 DIAGNOSIS — M545 Low back pain, unspecified: Secondary | ICD-10-CM | POA: Diagnosis not present

## 2020-02-25 MED ORDER — HYDROCODONE-ACETAMINOPHEN 5-325 MG PO TABS: ORAL_TABLET | ORAL | 0 refills | Status: DC

## 2020-02-25 NOTE — Patient Instructions (Signed)
[facility] [address] [place], [place] [address] [phone]   

## 2020-02-25 NOTE — Progress Notes (Signed)
Patient Cassandra Montoya, female DOB:10-19-1955, 65 y.o. JAS:[id number]  Chief Complaint  Patient presents with  . Back Pain    Patient reports 8/10 pain today.     HPI  Cassandra Montoya is a 65 y.o. female who has continued lower back pain.  She is worse.  She did not get appointment to see Dr. Ernestina Patches for epidural for some reason or another.  I will have the staff call and get her an appointment.  She has spasm at times.  She is out of her pain medicine. The cold weather has made her worse.  She has no new trauma, no numbness.   Body mass index is 35.27 kg/m.  ROS  Review of Systems  Constitutional: Positive for activity change.  Respiratory: Positive for shortness of breath and wheezing.   Musculoskeletal: Positive for arthralgias, back pain and gait problem.  All other systems reviewed and are negative.   All other systems reviewed and are negative.  The following is a summary of the past history medically, past history surgically, known current medicines, social history and family history.  This information is gathered electronically by the computer from prior information and documentation.  I review this each visit and have found including this information at this point in the chart is beneficial and informative.    Past Medical History:  Diagnosis Date  . Asthma   . COPD (chronic obstructive pulmonary disease) ([place])   . Ovarian cyst    Right ovarian cyst,   . Pneumonia     Past Surgical History:  Procedure Laterality Date  . BIOPSY  03/26/2018   Procedure: BIOPSY;  Surgeon: Daneil Dolin, MD;  [facility];  Service: Endoscopy;;  gastric  . BIOPSY  08/13/2018   Procedure: BIOPSY;  Surgeon: Daneil Dolin, MD;  [facility];  Service: Endoscopy;;  gastric  . COLONOSCOPY N/A 03/26/2018   Dr. 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  10-year screening colonoscopy  . ESOPHAGOGASTRODUODENOSCOPY N/A 03/26/2018   Dr. Emerson Monte: Esophagitis, mild  Schatzki ring, gastric ulcer with multiple satellite erosions.  Esophagus was dilated.  Gastric biopsies were benign.  No H. pylori.  Recommend 48-month surveillance EGD.  Marland Kitchen ESOPHAGOGASTRODUODENOSCOPY N/A 08/13/2018   Procedure: ESOPHAGOGASTRODUODENOSCOPY (EGD);  Surgeon: Daneil Dolin, MD;  [facility];  Service: Endoscopy;  Laterality: N/A;  12:15pm  . MALONEY DILATION N/A 03/26/2018   Procedure: Venia Minks DILATION;  Surgeon: Daneil Dolin, MD;  [facility];  Service: Endoscopy;  Laterality: N/A;  Venia Minks DILATION N/A 08/13/2018   Procedure: Venia Minks DILATION;  Surgeon: Daneil Dolin, MD;  [facility];  Service: Endoscopy;  Laterality: N/A;    Family History  Problem Relation Age of Onset  . Stroke Brother   . Hypertension Mother   . Colon cancer Neg Hx     Social History Social History   Tobacco Use  . Smoking status: Current Every Day Smoker    Packs/day: 0.50    Types: Cigarettes  . Smokeless tobacco: Never Used  Vaping Use  . Vaping Use: Never used  Substance Use Topics  . Alcohol use: No  . Drug use: No    No Known Allergies  Current Outpatient Medications  Medication Sig Dispense Refill  . albuterol (PROVENTIL HFA;VENTOLIN HFA) 108 (90 Base) MCG/ACT inhaler Inhale 1-2 puffs into the lungs every 6 (six) hours as needed for wheezing or shortness of breath.    Marland Kitchen  albuterol (PROVENTIL) (2.5 MG/3ML) 0.083% nebulizer solution Take 3 mLs (2.5 mg total) by nebulization every 4 (four) hours as needed for wheezing or shortness of breath. 75 mL 12  . aspirin EC 81 MG tablet Take 81 mg by mouth daily.    . Aspirin-Salicylamide-Caffeine (BC HEADACHE POWDER PO) Take 1 packet by mouth daily as needed (pain).    Marland Kitchen escitalopram (LEXAPRO) 5 MG tablet     . feeding supplement, ENSURE ENLIVE, (ENSURE ENLIVE) LIQD Take 237 mLs by mouth 2 (two) times daily between meals. (Patient taking differently: Take 237 mLs by mouth 2 (two) times a week.) 237 mL 12   . Fluticasone-Umeclidin-Vilant 100-62.5-25 MCG/INH AEPB Inhale 1 puff into the lungs daily.     Marland Kitchen gabapentin (NEURONTIN) 300 MG capsule Take 300 mg by mouth daily.    . pantoprazole (PROTONIX) 40 MG tablet Take 1 tablet (40 mg total) by mouth daily before breakfast. 30 tablet 5  . traMADol (ULTRAM) 50 MG tablet Take 50 mg by mouth every 6 (six) hours as needed.    Marland Kitchen HYDROcodone-acetaminophen (NORCO/VICODIN) 5-325 MG tablet One tablet every six hours for pain.  Limit 7 days. 28 tablet 0   No current facility-administered medications for this visit.     Physical Exam  Blood pressure (!) 185/95, pulse 73, height 4\' 9"  (1.448 m), weight 163 lb (73.9 kg).  Constitutional: overall normal hygiene, normal nutrition, well developed, normal grooming, normal body habitus. Assistive device:none  Musculoskeletal: gait and station Limp none, muscle tone and strength are normal, no tremors or atrophy is present.  .  Neurological: coordination overall normal.  Deep tendon reflex/nerve stretch intact.  Sensation normal.  Cranial nerves II-XII intact.   Skin:   Normal overall no scars, lesions, ulcers or rashes. No psoriasis.  Psychiatric: Alert and oriented x 3.  Recent memory intact, remote memory unclear.  Normal mood and affect. Well groomed.  Good eye contact.  Cardiovascular: overall no swelling, no varicosities, no edema bilaterally, normal temperatures of the legs and arms, no clubbing, cyanosis and good capillary refill.  Lymphatic: palpation is normal.  Spine/Pelvis examination:  Inspection:  Overall, sacoiliac joint benign and hips nontender; without crepitus or defects.   Thoracic spine inspection: Alignment normal without kyphosis present   Lumbar spine inspection:  Alignment  with normal lumbar lordosis, without scoliosis apparent.   Thoracic spine palpation:  without tenderness of spinal processes   Lumbar spine palpation: without tenderness of lumbar area; without tightness of  lumbar muscles    Range of Motion:   Lumbar flexion, forward flexion is normal without pain or tenderness    Lumbar extension is full without pain or tenderness   Left lateral bend is normal without pain or tenderness   Right lateral bend is normal without pain or tenderness   Straight leg raising is normal  Strength & tone: normal   Stability overall normal stability  All other systems reviewed and are negative   The patient has been educated about the nature of the problem(s) and counseled on treatment options.  The patient appeared to understand what I have discussed and is in agreement with it.  Encounter Diagnosis  Name Primary?  . Lumbar pain with radiation down both legs Yes    PLAN Call if any problems.  Precautions discussed.  Continue current medications.   Return to clinic 1 month   I have reviewed the [facility] Controlled Substance Reporting System web site prior to prescribing narcotic medicine for this patient.  To see Dr. Alvester Morin for epidural.  Electronically Signed Darreld Mclean, MD 1/6/20229:02 AM

## 2020-03-16 ENCOUNTER — Telehealth: Payer: Self-pay | Admitting: Physical Medicine and Rehabilitation

## 2020-03-16 ENCOUNTER — Ambulatory Visit: Payer: PRIVATE HEALTH INSURANCE | Admitting: Physical Medicine and Rehabilitation

## 2020-03-16 NOTE — Telephone Encounter (Signed)
Rescheduled

## 2020-03-16 NOTE — Telephone Encounter (Signed)
Pt called stating she needs to R/S her appt that's at 1 today

## 2020-03-21 ENCOUNTER — Telehealth: Payer: Self-pay | Admitting: Orthopaedic Surgery

## 2020-03-21 MED ORDER — HYDROCODONE-ACETAMINOPHEN 5-325 MG PO TABS: ORAL_TABLET | ORAL | 0 refills | Status: DC

## 2020-03-21 NOTE — Telephone Encounter (Signed)
Patient called request refill on pain meds  HYDROcodone-Acetaminophen (Tab) NORCO/VICODIN 5-325 MG        Pharmacy:  [facility]

## 2020-03-24 ENCOUNTER — Ambulatory Visit: Payer: PRIVATE HEALTH INSURANCE | Admitting: Orthopaedic Surgery

## 2020-04-04 ENCOUNTER — Ambulatory Visit (INDEPENDENT_AMBULATORY_CARE_PROVIDER_SITE_OTHER): Payer: PRIVATE HEALTH INSURANCE | Admitting: Physical Medicine and Rehabilitation

## 2020-04-04 ENCOUNTER — Other Ambulatory Visit: Payer: Self-pay

## 2020-04-04 ENCOUNTER — Ambulatory Visit: Payer: Self-pay

## 2020-04-04 VITALS — BP 171/87 | HR 77

## 2020-04-04 DIAGNOSIS — M48062 Spinal stenosis, lumbar region with neurogenic claudication: Secondary | ICD-10-CM

## 2020-04-04 DIAGNOSIS — M5416 Radiculopathy, lumbar region: Secondary | ICD-10-CM

## 2020-04-04 MED ORDER — METHYLPREDNISOLONE ACETATE 80 MG/ML IJ SUSP
80.0000 mg | Freq: Once | INTRAMUSCULAR | Status: AC
  Administered 2020-04-04: 80 mg

## 2020-04-04 NOTE — Progress Notes (Signed)
Cassandra Montoya - 65 y.o. female MRN [id number]  Date of birth: 06/03/1955  Office Visit Note: Visit Date: 04/04/2020 PCP: Altamease Oiler, FNP Referred by: Altamease Oiler, FNP  Subjective: No chief complaint on file.  HPI:  Cassandra Montoya is a 65 y.o. female who comes in today at the request of Dr. Darreld Mclean for planned Bilateral L4-L5 Lumbar epidural steroid injection with fluoroscopic guidance.  The patient has failed conservative care including home exercise, medications, time and activity modification.  This injection will be diagnostic and hopefully therapeutic.  Please see requesting physician notes for further details and justification.  MRI reviewed with images and spine model.  MRI reviewed in the note below.    ROS Otherwise per HPI.  Assessment & Plan: Visit Diagnoses:    ICD-10-CM   1. Lumbar radiculopathy  M54.16 XR C-ARM NO REPORT    Epidural Steroid injection    methylPREDNISolone acetate (DEPO-MEDROL) injection 80 mg  2. Spinal stenosis of lumbar region with neurogenic claudication  M48.062     Plan: No additional findings.   Meds & Orders:  Meds ordered this encounter  Medications  . methylPREDNISolone acetate (DEPO-MEDROL) injection 80 mg    Orders Placed This Encounter  Procedures  . XR C-ARM NO REPORT  . Epidural Steroid injection    Follow-up: Return if symptoms worsen or fail to improve.   Procedures: No procedures performed  Lumbosacral Transforaminal Epidural Steroid Injection - Sub-Pedicular Approach with Fluoroscopic Guidance  Patient: Cassandra Montoya      Date of Birth: [birth year] MRN: [id number] PCP: Altamease Oiler, FNP      Visit Date: 04/04/2020   Universal Protocol:    Date/Time: 04/04/2020  Consent Given By: the patient  Position: PRONE  Additional Comments: Vital signs were monitored before and after the procedure. Patient was prepped and draped in the usual sterile fashion. The correct patient, procedure, and  site was verified.   Injection Procedure Details:   Procedure diagnoses: Lumbar radiculopathy [M54.16]    Meds Administered:  Meds ordered this encounter  Medications  . methylPREDNISolone acetate (DEPO-MEDROL) injection 80 mg    Laterality: Bilateral  Location/Site:  L4-L5  Needle:5.0 in., 22 ga.  Short bevel or Quincke spinal needle  Needle Placement: Transforaminal  Findings:    -Comments: Excellent flow of contrast along the nerve, nerve root and into the epidural space.  Procedure Details: After squaring off the end-plates to get a true AP view, the C-arm was positioned so that an oblique view of the foramen as noted above was visualized. The target area is just inferior to the "nose of the scotty dog" or sub pedicular. The soft tissues overlying this structure were infiltrated with 2-3 ml. of 1% Lidocaine without Epinephrine.  The spinal needle was inserted toward the target using a "trajectory" view along the fluoroscope beam.  Under AP and lateral visualization, the needle was advanced so it did not puncture dura and was located close the 6 O'Clock position of the pedical in AP tracterory. Biplanar projections were used to confirm position. Aspiration was confirmed to be negative for CSF and/or blood. A 1-2 ml. volume of Isovue-250 was injected and flow of contrast was noted at each level. Radiographs were obtained for documentation purposes.   After attaining the desired flow of contrast documented above, a 0.5 to 1.0 ml test dose of 0.25% Marcaine was injected into each respective transforaminal space.  The patient was observed for 90 seconds post injection.  After no sensory deficits were reported, and normal lower extremity motor function was noted,   the above injectate was administered so that equal amounts of the injectate were placed at each foramen (level) into the transforaminal epidural space.   Additional Comments:  The patient tolerated the procedure  well Dressing: 2 x 2 sterile gauze and Band-Aid    Post-procedure details: Patient was observed during the procedure. Post-procedure instructions were reviewed.  Patient left the clinic in stable condition.      Clinical History: MRI LUMBAR SPINE WITHOUT CONTRAST  TECHNIQUE: Multiplanar, multisequence MR imaging of the lumbar spine was performed. No intravenous contrast was administered.  COMPARISON:  03/04/2018 abdomen and pelvis CT  FINDINGS: Segmentation:  5 lumbar type vertebrae  Alignment: Grade 1 anterolisthesis at L3-4 and L4-5. Borderline anterolisthesis at L5-S1.  Vertebrae:  No fracture, evidence of discitis, or bone lesion.  Conus medullaris and cauda equina: Conus extends to the T12-L1 level. Conus and cauda equina appear normal.  Paraspinal and other soft tissues: Negative  Disc levels:  T12- L1: Small left paracentral protrusion.  L1-L2: Unremarkable.  L2-L3: Mild disc bulging.  L3-L4: Facet spurring and ligamentous thickening with slight anterolisthesis. The disc is bulging with mild to moderate spinal stenosis.  L4-L5: Facet osteoarthritis with spurring and joint distortion. Anterolisthesis. The disc is bulging and there is high-grade spinal stenosis. Mild bilateral foraminal narrowing  L5-S1:Facet osteoarthritis with spurring on the right more than left. The disc is narrowed and bulging with a downward pointing herniation deforming the ventral thecal sac and impinging on the left subarticular recess. Noncompressive foraminal narrowing on the right more than left.  IMPRESSION: 1. Disc and facet degeneration especially at L3-4 and below where there is mild listheses. 2. Spinal stenosis that is advanced at L4-5 and mild-to-moderate at L3-4. 3. L5-S1 asymmetric left subarticular recess effacement.   Electronically Signed   By: Marnee Spring M.D.   On: 01/07/2020 04:46     Objective:  VS:  HT:    WT:   BMI:      BP:(!) 171/87  HR:77bpm  TEMP: ( )  RESP:97 % Physical Exam Vitals and nursing note reviewed.  Constitutional:      General: She is not in acute distress.    Appearance: Normal appearance. She is not ill-appearing.  HENT:     Head: Normocephalic and atraumatic.     Right Ear: External ear normal.     Left Ear: External ear normal.  Eyes:     Extraocular Movements: Extraocular movements intact.  Cardiovascular:     Rate and Rhythm: Normal rate.     Pulses: Normal pulses.  Pulmonary:     Effort: Pulmonary effort is normal. No respiratory distress.  Abdominal:     General: There is no distension.     Palpations: Abdomen is soft.  Musculoskeletal:        General: Tenderness present.     Cervical back: Neck supple.     Right lower leg: No edema.     Left lower leg: No edema.     Comments: Patient has good distal strength with no pain over the greater trochanters.  No clonus or focal weakness.  Skin:    Findings: No erythema, lesion or rash.  Neurological:     General: No focal deficit present.     Mental Status: She is alert and oriented to person, place, and time.     Sensory: No sensory deficit.     Motor: No weakness  or abnormal muscle tone.     Coordination: Coordination normal.  Psychiatric:        Mood and Affect: Mood normal.        Behavior: Behavior normal.      Imaging: No results found.

## 2020-04-04 NOTE — Procedures (Signed)
Lumbosacral Transforaminal Epidural Steroid Injection - Sub-Pedicular Approach with Fluoroscopic Guidance  Patient: Cassandra Montoya      Date of Birth: [birth year] MRN: [id number] PCP: Altamease Oiler, FNP      Visit Date: 04/04/2020   Universal Protocol:    Date/Time: 04/04/2020  Consent Given By: the patient  Position: PRONE  Additional Comments: Vital signs were monitored before and after the procedure. Patient was prepped and draped in the usual sterile fashion. The correct patient, procedure, and site was verified.   Injection Procedure Details:   Procedure diagnoses: Lumbar radiculopathy [M54.16]    Meds Administered:  Meds ordered this encounter  Medications  . methylPREDNISolone acetate (DEPO-MEDROL) injection 80 mg    Laterality: Bilateral  Location/Site:  L4-L5  Needle:5.0 in., 22 ga.  Short bevel or Quincke spinal needle  Needle Placement: Transforaminal  Findings:    -Comments: Excellent flow of contrast along the nerve, nerve root and into the epidural space.  Procedure Details: After squaring off the end-plates to get a true AP view, the C-arm was positioned so that an oblique view of the foramen as noted above was visualized. The target area is just inferior to the "nose of the scotty dog" or sub pedicular. The soft tissues overlying this structure were infiltrated with 2-3 ml. of 1% Lidocaine without Epinephrine.  The spinal needle was inserted toward the target using a "trajectory" view along the fluoroscope beam.  Under AP and lateral visualization, the needle was advanced so it did not puncture dura and was located close the 6 O'Clock position of the pedical in AP tracterory. Biplanar projections were used to confirm position. Aspiration was confirmed to be negative for CSF and/or blood. A 1-2 ml. volume of Isovue-250 was injected and flow of contrast was noted at each level. Radiographs were obtained for documentation purposes.   After attaining the  desired flow of contrast documented above, a 0.5 to 1.0 ml test dose of 0.25% Marcaine was injected into each respective transforaminal space.  The patient was observed for 90 seconds post injection.  After no sensory deficits were reported, and normal lower extremity motor function was noted,   the above injectate was administered so that equal amounts of the injectate were placed at each foramen (level) into the transforaminal epidural space.   Additional Comments:  The patient tolerated the procedure well Dressing: 2 x 2 sterile gauze and Band-Aid    Post-procedure details: Patient was observed during the procedure. Post-procedure instructions were reviewed.  Patient left the clinic in stable condition.

## 2020-04-04 NOTE — Progress Notes (Signed)
Numeric Pain Rating Scale and Functional Assessment Average Pain 9 9/10  In the last MONTH (on 0-10 scale) has pain interfered with the following?  1. General activity like being  able to carry out your everyday physical activities such as walking, climbing stairs, carrying groceries, or moving a chair?  Rating(9)9/10  +Driver, -BT, -Dye Allergies.

## 2020-04-07 ENCOUNTER — Other Ambulatory Visit: Payer: Self-pay

## 2020-04-07 ENCOUNTER — Encounter: Payer: Self-pay | Admitting: Orthopaedic Surgery

## 2020-04-07 ENCOUNTER — Ambulatory Visit (INDEPENDENT_AMBULATORY_CARE_PROVIDER_SITE_OTHER): Payer: Self-pay | Admitting: Orthopaedic Surgery

## 2020-04-07 VITALS — BP 157/97 | HR 71 | Ht <= 58 in | Wt 160.0 lb

## 2020-04-07 DIAGNOSIS — M79605 Pain in left leg: Secondary | ICD-10-CM

## 2020-04-07 DIAGNOSIS — M545 Low back pain, unspecified: Secondary | ICD-10-CM

## 2020-04-07 DIAGNOSIS — M79604 Pain in right leg: Secondary | ICD-10-CM

## 2020-04-07 MED ORDER — HYDROCODONE-ACETAMINOPHEN 5-325 MG PO TABS: ORAL_TABLET | ORAL | 0 refills | Status: DC

## 2020-04-07 NOTE — Progress Notes (Signed)
Patient Cassandra Montoya, female DOB:10/16/1955, 65 y.o. HUT:[id number]  Chief Complaint  Patient presents with  . Back Pain    S/p ESI with Dr Alvester Morin on 04/04/20    HPI  Cassandra Montoya is a 65 y.o. female who has lower back pain.  She had epidural by Dr. Alvester Morin on 04-04-20.  She is better today.  I have read Dr. Mason Blas notes.  She has no new trauma.  She is taking her medicine.  I will refill her pain medicine.   Body mass index is 34.62 kg/m.  ROS  Review of Systems  Constitutional: Positive for activity change.  Respiratory: Positive for shortness of breath and wheezing.   Musculoskeletal: Positive for arthralgias, back pain and gait problem.  All other systems reviewed and are negative.   All other systems reviewed and are negative.  The following is a summary of the past history medically, past history surgically, known current medicines, social history and family history.  This information is gathered electronically by the computer from prior information and documentation.  I review this each visit and have found including this information at this point in the chart is beneficial and informative.    Past Medical History:  Diagnosis Date  . Asthma   . COPD (chronic obstructive pulmonary disease) (HCC)   . Ovarian cyst    Right ovarian cyst,   . Pneumonia     Past Surgical History:  Procedure Laterality Date  . BIOPSY  03/26/2018   Procedure: BIOPSY;  Surgeon: Corbin Ade, MD;  Location: [facility];  Service: Endoscopy;;  gastric  . BIOPSY  08/13/2018   Procedure: BIOPSY;  Surgeon: Corbin Ade, MD;  Location: [facility];  Service: Endoscopy;;  gastric  . COLONOSCOPY N/A 03/26/2018   Dr. 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  10-year screening colonoscopy  . ESOPHAGOGASTRODUODENOSCOPY N/A 03/26/2018   Dr. Lovena Neighbours: Esophagitis, mild Schatzki ring, gastric ulcer with multiple satellite erosions.  Esophagus was dilated.  Gastric biopsies were benign.   No H. pylori.  Recommend 68-month surveillance EGD.  Marland Kitchen ESOPHAGOGASTRODUODENOSCOPY N/A 08/13/2018   Procedure: ESOPHAGOGASTRODUODENOSCOPY (EGD);  Surgeon: Corbin Ade, MD;  Location: [facility];  Service: Endoscopy;  Laterality: N/A;  12:15pm  . MALONEY DILATION N/A 03/26/2018   Procedure: Elease Hashimoto DILATION;  Surgeon: Corbin Ade, MD;  Location: [facility];  Service: Endoscopy;  Laterality: N/A;  Elease Hashimoto DILATION N/A 08/13/2018   Procedure: Elease Hashimoto DILATION;  Surgeon: Corbin Ade, MD;  Location: [facility];  Service: Endoscopy;  Laterality: N/A;    Family History  Problem Relation Age of Onset  . Stroke Brother   . Hypertension Mother   . Colon cancer Neg Hx     Social History Social History   Tobacco Use  . Smoking status: Current Every Day Smoker    Packs/day: 0.50    Types: Cigarettes  . Smokeless tobacco: Never Used  Vaping Use  . Vaping Use: Never used  Substance Use Topics  . Alcohol use: No  . Drug use: No    No Known Allergies  Current Outpatient Medications  Medication Sig Dispense Refill  . albuterol (PROVENTIL HFA;VENTOLIN HFA) 108 (90 Base) MCG/ACT inhaler Inhale 1-2 puffs into the lungs every 6 (six) hours as needed for wheezing or shortness of breath.    Marland Kitchen albuterol (PROVENTIL) (2.5 MG/3ML) 0.083% nebulizer solution Take 3 mLs (2.5 mg total) by nebulization every 4 (four) hours as needed  for wheezing or shortness of breath. 75 mL 12  . aspirin EC 81 MG tablet Take 81 mg by mouth daily.    . Aspirin-Salicylamide-Caffeine (BC HEADACHE POWDER PO) Take 1 packet by mouth daily as needed (pain).    Marland Kitchen escitalopram (LEXAPRO) 5 MG tablet     . feeding supplement, ENSURE ENLIVE, (ENSURE ENLIVE) LIQD Take 237 mLs by mouth 2 (two) times daily between meals. (Patient taking differently: Take 237 mLs by mouth 2 (two) times a week.) 237 mL 12  . Fluticasone-Umeclidin-Vilant 100-62.5-25 MCG/INH AEPB Inhale 1 puff into the lungs daily.     Marland Kitchen gabapentin  (NEURONTIN) 300 MG capsule Take 300 mg by mouth daily.    Marland Kitchen lisinopril (ZESTRIL) 20 MG tablet 1 tablet    . pantoprazole (PROTONIX) 40 MG tablet Take 1 tablet (40 mg total) by mouth daily before breakfast. 30 tablet 5  . traMADol (ULTRAM) 50 MG tablet Take 50 mg by mouth every 6 (six) hours as needed.    . diazepam (VALIUM) 10 MG tablet Take 10 mg by mouth daily as needed.    . hydrochlorothiazide (HYDRODIURIL) 25 MG tablet Take 25 mg by mouth daily.    Marland Kitchen HYDROcodone-acetaminophen (NORCO/VICODIN) 5-325 MG tablet One tablet every six hours for pain.  Limit 7 days. 28 tablet 0   No current facility-administered medications for this visit.     Physical Exam  Blood pressure (!) 157/97, pulse 71, height 4\' 9"  (1.448 m), weight 160 lb (72.6 kg).  Constitutional: overall normal hygiene, normal nutrition, well developed, normal grooming, normal body habitus. Assistive device:none  Musculoskeletal: gait and station Limp none, muscle tone and strength are normal, no tremors or atrophy is present.  .  Neurological: coordination overall normal.  Deep tendon reflex/nerve stretch intact.  Sensation normal.  Cranial nerves II-XII intact.   Skin:   Normal overall no scars, lesions, ulcers or rashes. No psoriasis.  Psychiatric: Alert and oriented x 3.  Recent memory intact, remote memory unclear.  Normal mood and affect. Well groomed.  Good eye contact.  Cardiovascular: overall no swelling, no varicosities, no edema bilaterally, normal temperatures of the legs and arms, no clubbing, cyanosis and good capillary refill.  Lymphatic: palpation is normal.  Spine/Pelvis examination:  Inspection:  Overall, sacoiliac joint benign and hips nontender; without crepitus or defects.   Thoracic spine inspection: Alignment normal without kyphosis present   Lumbar spine inspection:  Alignment  with normal lumbar lordosis, without scoliosis apparent.   Thoracic spine palpation:  without tenderness of spinal  processes   Lumbar spine palpation: without tenderness of lumbar area; without tightness of lumbar muscles    Range of Motion:   Lumbar flexion, forward flexion is normal without pain or tenderness    Lumbar extension is full without pain or tenderness   Left lateral bend is normal without pain or tenderness   Right lateral bend is normal without pain or tenderness   Straight leg raising is normal  Strength & tone: normal   Stability overall normal stability  All other systems reviewed and are negative   The patient has been educated about the nature of the problem(s) and counseled on treatment options.  The patient appeared to understand what I have discussed and is in agreement with it.  Encounter Diagnosis  Name Primary?  . Lumbar pain with radiation down both legs Yes    PLAN Call if any problems.  Precautions discussed.  Continue current medications.   Return to clinic 1 month  I have reviewed the [place] Controlled Substance Reporting System web site prior to prescribing narcotic medicine for this patient.   Electronically Signed Darreld Mclean, MD 2/17/20228:53 AM

## 2020-04-18 ENCOUNTER — Telehealth: Payer: Self-pay | Admitting: Orthopaedic Surgery

## 2020-04-18 MED ORDER — HYDROCODONE-ACETAMINOPHEN 5-325 MG PO TABS: ORAL_TABLET | ORAL | 0 refills | Status: DC

## 2020-04-18 NOTE — Telephone Encounter (Signed)
Patient requests refill on Hydrocodone/Acetaminophen 5-325  Mgs.  Qty  28  Sig: One tablet every six hours for pain. Limit 7 days.  Patient states she uses [facility] 

## 2020-04-27 ENCOUNTER — Other Ambulatory Visit: Payer: Self-pay | Admitting: Orthopaedic Surgery

## 2020-04-29 ENCOUNTER — Telehealth: Payer: Self-pay | Admitting: Orthopaedic Surgery

## 2020-04-29 NOTE — Telephone Encounter (Signed)
Ms. Cassandra Montoya called today stating that she had her drugstore [facility], sent in for a refill on her pain medicine.  She wanted to know if this was done.  She said she needs her medicine because she is in a lot of pain.  I told her that it was sent in by the pharmacy but that Dr. Hilda Lias had declined the request.  She wanted to know why he wouldn't give it to her.  I told her that a reason was not given.  She said she has an appointment on the 22nd.  I told her that she should discuss pain medications with Dr. Hilda Lias at that time.  She said she would do this.

## 2020-05-10 ENCOUNTER — Ambulatory Visit: Payer: Self-pay | Admitting: Orthopaedic Surgery

## 2020-05-10 ENCOUNTER — Encounter: Payer: Self-pay | Admitting: Orthopaedic Surgery

## 2020-06-26 ENCOUNTER — Emergency Department (HOSPITAL_COMMUNITY): Payer: Self-pay

## 2020-06-26 ENCOUNTER — Telehealth (HOSPITAL_COMMUNITY): Payer: Self-pay | Admitting: Student

## 2020-06-26 ENCOUNTER — Emergency Department (HOSPITAL_COMMUNITY)
Admission: EM | Admit: 2020-06-26 | Discharge: 2020-06-26 | Disposition: A | Discharge status: Home / Self Care | Payer: Self-pay | Attending: Emergency Medicine | Admitting: Emergency Medicine

## 2020-06-26 ENCOUNTER — Encounter (HOSPITAL_COMMUNITY): Payer: Self-pay | Admitting: Emergency Medicine

## 2020-06-26 ENCOUNTER — Other Ambulatory Visit: Payer: Self-pay

## 2020-06-26 DIAGNOSIS — R059 Cough, unspecified: Secondary | ICD-10-CM | POA: Insufficient documentation

## 2020-06-26 DIAGNOSIS — R0989 Other specified symptoms and signs involving the circulatory and respiratory systems: Secondary | ICD-10-CM | POA: Insufficient documentation

## 2020-06-26 DIAGNOSIS — J45909 Unspecified asthma, uncomplicated: Secondary | ICD-10-CM | POA: Insufficient documentation

## 2020-06-26 DIAGNOSIS — Z7982 Long term (current) use of aspirin: Secondary | ICD-10-CM | POA: Insufficient documentation

## 2020-06-26 DIAGNOSIS — Z7951 Long term (current) use of inhaled steroids: Secondary | ICD-10-CM | POA: Insufficient documentation

## 2020-06-26 DIAGNOSIS — Z20822 Contact with and (suspected) exposure to covid-19: Secondary | ICD-10-CM | POA: Insufficient documentation

## 2020-06-26 DIAGNOSIS — F17210 Nicotine dependence, cigarettes, uncomplicated: Secondary | ICD-10-CM | POA: Insufficient documentation

## 2020-06-26 DIAGNOSIS — J441 Chronic obstructive pulmonary disease with (acute) exacerbation: Secondary | ICD-10-CM | POA: Insufficient documentation

## 2020-06-26 DIAGNOSIS — R0602 Shortness of breath: Secondary | ICD-10-CM | POA: Insufficient documentation

## 2020-06-26 LAB — COMPREHENSIVE METABOLIC PANEL
ALT: 35 U/L (ref 0–44)
AST: 35 U/L (ref 15–41)
Albumin: 3.6 g/dL (ref 3.5–5.0)
Alkaline Phosphatase: 101 U/L (ref 38–126)
Anion gap: 7 (ref 5–15)
BUN: 14 mg/dL (ref 8–23)
CO2: 27 mmol/L (ref 22–32)
Calcium: 8.5 mg/dL — ABNORMAL LOW (ref 8.9–10.3)
Chloride: 107 mmol/L (ref 98–111)
Creatinine, Ser: 0.54 mg/dL (ref 0.44–1.00)
GFR, Estimated: >60 mL/min (ref >60)
Glucose, Bld: 92 mg/dL (ref 70–99)
Potassium: 4.4 mmol/L (ref 3.5–5.1)
Sodium: 141 mmol/L (ref 135–145)
Total Bilirubin: 0.3 mg/dL (ref 0.3–1.2)
Total Protein: 7 g/dL (ref 6.5–8.1)

## 2020-06-26 LAB — CBC WITH DIFFERENTIAL/PLATELET
Abs Immature Granulocytes: 0.01 10*3/uL (ref 0.00–0.07)
Basophils Absolute: 0.1 10*3/uL (ref 0.0–0.1)
Basophils Relative: 1 %
Eosinophils Absolute: 0.4 10*3/uL (ref 0.0–0.5)
Eosinophils Relative: 7 %
HCT: 42.8 % (ref 36.0–46.0)
Hemoglobin: 13.6 g/dL (ref 12.0–15.0)
Immature Granulocytes: 0 %
Lymphocytes Relative: 49 %
Lymphs Abs: 2.5 10*3/uL (ref 0.7–4.0)
MCH: 32.4 pg (ref 26.0–34.0)
MCHC: 31.8 g/dL (ref 30.0–36.0)
MCV: 101.9 fL — ABNORMAL HIGH (ref 80.0–100.0)
Monocytes Absolute: 0.8 10*3/uL (ref 0.1–1.0)
Monocytes Relative: 15 %
Neutro Abs: 1.4 10*3/uL — ABNORMAL LOW (ref 1.7–7.7)
Neutrophils Relative %: 28 %
Platelets: 351 10*3/uL (ref 150–400)
RBC: 4.2 MIL/uL (ref 3.87–5.11)
RDW: 14 % (ref 11.5–15.5)
WBC: 5.1 10*3/uL (ref 4.0–10.5)
nRBC: 0 % (ref 0.0–0.2)

## 2020-06-26 LAB — BRAIN NATRIURETIC PEPTIDE: B Natriuretic Peptide: 22 pg/mL (ref 0.0–100.0)

## 2020-06-26 LAB — RESP PANEL BY RT-PCR (FLU A&B, COVID) ARPGX2
Influenza A by PCR: NEGATIVE
Influenza B by PCR: NEGATIVE
SARS Coronavirus 2 by RT PCR: NEGATIVE

## 2020-06-26 LAB — TROPONIN I (HIGH SENSITIVITY): Troponin I (High Sensitivity): 20 ng/L — ABNORMAL HIGH (ref <18)

## 2020-06-26 MED ORDER — PREDNISONE 10 MG PO TABS: 20.0000 mg | ORAL_TABLET | Freq: Every day | ORAL | 0 refills | Status: DC

## 2020-06-26 MED ORDER — AZITHROMYCIN 250 MG PO TABS
250.0000 mg | ORAL_TABLET | Freq: Every day | ORAL | 0 refills | Status: DC
Start: 2020-06-26 — End: 2020-06-26

## 2020-06-26 MED ORDER — AZITHROMYCIN 250 MG PO TABS: 250.0000 mg | ORAL_TABLET | Freq: Every day | ORAL | 0 refills | Status: DC

## 2020-06-26 MED ORDER — METHYLPREDNISOLONE SODIUM SUCC 125 MG IJ SOLR
125.0000 mg | Freq: Once | INTRAMUSCULAR | Status: AC
  Administered 2020-06-26: 125 mg via INTRAVENOUS
  Filled 2020-06-26: qty 2

## 2020-06-26 MED ORDER — PREDNISONE 10 MG PO TABS: 20.0000 mg | ORAL_TABLET | Freq: Two times a day (BID) | ORAL | 0 refills | Status: DC

## 2020-06-26 MED ORDER — ALBUTEROL SULFATE HFA 108 (90 BASE) MCG/ACT IN AERS
4.0000 | INHALATION_SPRAY | Freq: Once | RESPIRATORY_TRACT | Status: AC
  Administered 2020-06-26: 4 via RESPIRATORY_TRACT
  Filled 2020-06-26: qty 6.7

## 2020-06-26 MED ORDER — AZITHROMYCIN 250 MG PO TABS
500.0000 mg | ORAL_TABLET | Freq: Once | ORAL | Status: AC
  Administered 2020-06-26: 500 mg via ORAL
  Filled 2020-06-26: qty 2

## 2020-06-26 NOTE — Discharge Instructions (Addendum)
Begin taking Zithromax and prednisone as prescribed.  Continue use of your albuterol nebulizer, 1 treatment every 4 hours for the next several days while awake.  Return to the emergency department if symptoms significantly worsen or change.

## 2020-06-26 NOTE — ED Provider Notes (Signed)
[facility] EMERGENCY DEPARTMENT Provider Note   CSN: [id number] Arrival date & time:        History Chief Complaint  Patient presents with  . Shortness of Breath    Cassandra Montoya is a 65 y.o. female.  Patient is a 65 year old female with past medical history of COPD, ovarian cyst, asthma, and prior pneumonia.  Patient presents today for evaluation of shortness of breath.  She describes cough productive of clear phlegm worsening over the past 2 days.  She denies any fevers or chills.  She denies chest pain.  Patient was brought by EMS.  I am told she had initial saturations of 94% at home.  Patient is not on home oxygen.  She has tried using her albuterol nebulizer at home with little relief.  The history is provided by the patient.  Shortness of Breath Severity:  Moderate Onset quality:  Gradual Duration:  2 days Timing:  Constant Progression:  Worsening Chronicity:  Recurrent Relieved by:  Nothing Worsened by:  Nothing Associated symptoms: sputum production   Associated symptoms: no fever        Past Medical History:  Diagnosis Date  . Asthma   . COPD (chronic obstructive pulmonary disease) (HCC)   . Ovarian cyst    Right ovarian cyst,   . Pneumonia     Patient Active Problem List   Diagnosis Date Noted  . Gastric ulcer 05/26/2018  . Abdominal pain, epigastric 01/30/2018  . Abnormal weight loss 01/30/2018  . Dysphagia 01/30/2018  . Loss of appetite 01/30/2018  . Nonpulsatile abdominal mass 01/30/2018  . Influenza A 02/14/2017  . Hypokalemia 02/14/2017  . Hyperglycemia, drug-induced 08/13/2015  . COPD with acute exacerbation (HCC) 08/12/2015  . Acute respiratory distress 08/12/2015  . Tobacco abuse 05/25/2014    Past Surgical History:  Procedure Laterality Date  . BIOPSY  03/26/2018   Procedure: BIOPSY;  Surgeon: Corbin Ade, MD;  [facility];  Service: Endoscopy;;  gastric  . BIOPSY  08/13/2018   Procedure: BIOPSY;  Surgeon: Corbin Ade, MD;  [facility];  Service: Endoscopy;;  gastric  . COLONOSCOPY N/A 03/26/2018   Dr. 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  10-year screening colonoscopy  . ESOPHAGOGASTRODUODENOSCOPY N/A 03/26/2018   Dr. Lovena Neighbours: Esophagitis, mild Schatzki ring, gastric ulcer with multiple satellite erosions.  Esophagus was dilated.  Gastric biopsies were benign.  No H. pylori.  Recommend 25-month surveillance EGD.  Marland Kitchen ESOPHAGOGASTRODUODENOSCOPY N/A 08/13/2018   Procedure: ESOPHAGOGASTRODUODENOSCOPY (EGD);  Surgeon: Corbin Ade, MD;  [facility];  Service: Endoscopy;  Laterality: N/A;  12:15pm  . MALONEY DILATION N/A 03/26/2018   Procedure: Elease Hashimoto DILATION;  Surgeon: Corbin Ade, MD;  [facility];  Service: Endoscopy;  Laterality: N/A;  Elease Hashimoto DILATION N/A 08/13/2018   Procedure: Elease Hashimoto DILATION;  Surgeon: Corbin Ade, MD;  [facility]: [facility];  Service: Endoscopy;  Laterality: N/A;     OB History   No obstetric history on file.     Family History  Problem Relation Age of Onset  . Stroke Brother   . Hypertension Mother   . Colon cancer Neg Hx     Social History   Tobacco Use  . Smoking status: Current Every Day Smoker    Packs/day: 0.50    Types: Cigarettes  . Smokeless tobacco: Never Used  Vaping Use  . Vaping Use: Never used  Substance Use Topics  .  Alcohol use: No  . Drug use: No    Home Medications Prior to Admission medications   Medication Sig Start Date End Date Taking? Authorizing Provider  albuterol (PROVENTIL HFA;VENTOLIN HFA) 108 (90 Base) MCG/ACT inhaler Inhale 1-2 puffs into the lungs every 6 (six) hours as needed for wheezing or shortness of breath.    [provider]  albuterol (PROVENTIL) (2.5 MG/3ML) 0.083% nebulizer solution Take 3 mLs (2.5 mg total) by nebulization every 4 (four) hours as needed for wheezing or shortness of breath. 02/17/17   Kathlen Mody, MD  aspirin EC 81 MG tablet Take 81  mg by mouth daily.    [provider]  Aspirin-Salicylamide-Caffeine (BC HEADACHE POWDER PO) Take 1 packet by mouth daily as needed (pain).    [provider]  diazepam (VALIUM) 10 MG tablet Take 10 mg by mouth daily as needed. 01/05/20   [provider]  escitalopram (LEXAPRO) 5 MG tablet  10/20/19   [provider]  feeding supplement, ENSURE ENLIVE, (ENSURE ENLIVE) LIQD Take 237 mLs by mouth 2 (two) times daily between meals. Patient taking differently: Take 237 mLs by mouth 2 (two) times a week. 02/17/17   Kathlen Mody, MD  Fluticasone-Umeclidin-Vilant 100-62.5-25 MCG/INH AEPB Inhale 1 puff into the lungs daily.     [provider]  gabapentin (NEURONTIN) 300 MG capsule Take 300 mg by mouth daily.    [provider]  hydrochlorothiazide (HYDRODIURIL) 25 MG tablet Take 25 mg by mouth daily. 11/24/19   [provider]  HYDROcodone-acetaminophen (NORCO/VICODIN) 5-325 MG tablet One tablet every six hours for pain.  Limit 7 days. 04/18/20   Darreld Mclean, MD  lisinopril (ZESTRIL) 20 MG tablet 1 tablet 01/12/19   [provider]  pantoprazole (PROTONIX) 40 MG tablet Take 1 tablet (40 mg total) by mouth daily before breakfast. 01/30/18   Tiffany Kocher, PA-C  traMADol (ULTRAM) 50 MG tablet Take 50 mg by mouth every 6 (six) hours as needed. 09/09/19   [provider]    Allergies    Patient has no known allergies.  Review of Systems   Review of Systems  Constitutional: Negative for fever.  Respiratory: Positive for sputum production and shortness of breath.   All other systems reviewed and are negative.   Physical Exam Updated Vital Signs BP (!) 156/90 (BP Location: Right Leg)   Pulse 80   Temp 97.6 F (36.4 C) (Oral)   Resp (!) 25   Ht 4\' 9"  (1.448 m)   Wt 72.6 kg   SpO2 97%   BMI 34.64 kg/m   Physical Exam Vitals and nursing note reviewed.  Constitutional:      General: She is not in acute  distress.    Appearance: She is well-developed. She is not diaphoretic.  HENT:     Head: Normocephalic and atraumatic.  Cardiovascular:     Rate and Rhythm: Normal rate and regular rhythm.     Heart sounds: No murmur heard. No friction rub. No gallop.   Pulmonary:     Effort: Pulmonary effort is normal. No respiratory distress.     Breath sounds: Examination of the right-middle field reveals rhonchi. Examination of the left-middle field reveals rhonchi. Rhonchi present. No wheezing.  Abdominal:     General: Bowel sounds are normal. There is no distension.     Palpations: Abdomen is soft.     Tenderness: There is no abdominal tenderness.  Musculoskeletal:        General: Normal range  of motion.     Cervical back: Normal range of motion and neck supple.  Skin:    General: Skin is warm and dry.  Neurological:     Mental Status: She is alert and oriented to person, place, and time.     ED Results / Procedures / Treatments   Labs (all labs ordered are listed, but only abnormal results are displayed) Labs Reviewed - No data to display  EKG EKG Interpretation  Date/Time:  Sunday Jun 26 2020 01:31:11 EDT Ventricular Rate:  81 PR Interval:  135 QRS Duration: 96 QT Interval:  363 QTC Calculation: 422 R Axis:   -28 Text Interpretation: Sinus rhythm Low voltage, precordial leads No significant change since 02/14/2017 Confirmed by Geoffery Lyons ([id number]) on 06/26/2020 1:34:03 AM   Radiology No results found.  Procedures Procedures   Medications Ordered in ED Medications  methylPREDNISolone sodium succinate (SOLU-MEDROL) 125 mg/2 mL injection 125 mg (has no administration in time range)  albuterol (VENTOLIN HFA) 108 (90 Base) MCG/ACT inhaler 4 puff (has no administration in time range)    ED Course  I have reviewed the triage vital signs and the nursing notes.  Pertinent labs & imaging results that were available during my care of the patient were reviewed by me and considered  in my medical decision making (see chart for details).    MDM Rules/Calculators/A&P  Patient presenting here with complaints of shortness of breath and history of COPD.  Her breathing difficulty seems to be related to COPD exacerbation.  Her chest x-ray is clear and remainder of her work-up is unremarkable.  She is feeling better after receiving albuterol and Solu-Medrol.  She has had a cough productive of sputum.  I feel as though treatment with antibiotics, steroids, and continued use of her nebulizer is appropriate.  Patient is resting comfortably with oxygen saturations in the mid 90s and is in no respiratory distress.  Final Clinical Impression(s) / ED Diagnoses Final diagnoses:  None    Rx / DC Orders ED Discharge Orders    None       Geoffery Lyons, MD 06/26/20 [phone]

## 2020-06-26 NOTE — Telephone Encounter (Signed)
Patient seen overnight by night ED provider. She was prescribed azithromycin and prednisone, however they were sent to a pharmacy that is closed on Sunday.  She has returned to the emergency department with request for prescriptions to be sent to new pharmacy.  I did not personally examine this patient and am only transferring her prescriptions to a new preferred pharmacy that is open on Sundays.

## 2020-06-26 NOTE — ED Triage Notes (Signed)
[facility] - pt c/o SOB x 2 days. Pt was 94% on RA upon EMS arrival. Pt was given 1 SL Nitro.

## 2020-07-24 ENCOUNTER — Emergency Department (HOSPITAL_COMMUNITY)
Admission: EM | Admit: 2020-07-24 | Discharge: 2020-07-24 | Disposition: A | Discharge status: Home / Self Care | Payer: Medicare Other | Attending: Emergency Medicine | Admitting: Emergency Medicine

## 2020-07-24 ENCOUNTER — Encounter (HOSPITAL_COMMUNITY): Payer: Self-pay | Admitting: *Deleted

## 2020-07-24 ENCOUNTER — Other Ambulatory Visit: Payer: Self-pay

## 2020-07-24 ENCOUNTER — Emergency Department (HOSPITAL_COMMUNITY): Payer: Medicare Other

## 2020-07-24 DIAGNOSIS — J441 Chronic obstructive pulmonary disease with (acute) exacerbation: Secondary | ICD-10-CM | POA: Diagnosis not present

## 2020-07-24 DIAGNOSIS — U071 COVID-19: Secondary | ICD-10-CM | POA: Diagnosis not present

## 2020-07-24 DIAGNOSIS — J45909 Unspecified asthma, uncomplicated: Secondary | ICD-10-CM | POA: Diagnosis not present

## 2020-07-24 DIAGNOSIS — Z7982 Long term (current) use of aspirin: Secondary | ICD-10-CM | POA: Insufficient documentation

## 2020-07-24 DIAGNOSIS — R0602 Shortness of breath: Secondary | ICD-10-CM | POA: Diagnosis present

## 2020-07-24 DIAGNOSIS — Z7951 Long term (current) use of inhaled steroids: Secondary | ICD-10-CM | POA: Insufficient documentation

## 2020-07-24 DIAGNOSIS — F17210 Nicotine dependence, cigarettes, uncomplicated: Secondary | ICD-10-CM | POA: Insufficient documentation

## 2020-07-24 LAB — RESP PANEL BY RT-PCR (FLU A&B, COVID) ARPGX2
Influenza A by PCR: NEGATIVE
Influenza B by PCR: NEGATIVE
SARS Coronavirus 2 by RT PCR: POSITIVE — AB

## 2020-07-24 MED ORDER — ALBUTEROL SULFATE (2.5 MG/3ML) 0.083% IN NEBU: 2.5000 mg | INHALATION_SOLUTION | Freq: Four times a day (QID) | RESPIRATORY_TRACT | 0 refills | Status: AC | PRN

## 2020-07-24 MED ORDER — ALBUTEROL SULFATE HFA 108 (90 BASE) MCG/ACT IN AERS
3.0000 | INHALATION_SPRAY | Freq: Once | RESPIRATORY_TRACT | Status: AC
  Administered 2020-07-24: 3 via RESPIRATORY_TRACT
  Filled 2020-07-24: qty 6.7

## 2020-07-24 MED ORDER — BENZONATATE 200 MG PO CAPS: 200.0000 mg | ORAL_CAPSULE | Freq: Three times a day (TID) | ORAL | 0 refills | Status: DC | PRN

## 2020-07-24 MED ORDER — NIRMATRELVIR/RITONAVIR (PAXLOVID)TABLET: 3.0000 | ORAL_TABLET | Freq: Two times a day (BID) | ORAL | 0 refills | Status: AC

## 2020-07-24 MED ORDER — BENZONATATE 100 MG PO CAPS
200.0000 mg | ORAL_CAPSULE | Freq: Once | ORAL | Status: AC
  Administered 2020-07-24: 200 mg via ORAL
  Filled 2020-07-24: qty 2

## 2020-07-24 NOTE — ED Provider Notes (Signed)
[facility] EMERGENCY DEPARTMENT Provider Note   CSN: [id number] Arrival date & time: 07/24/20  2542     History Chief Complaint  Patient presents with  . Shortness of Breath    Cassandra Montoya is a 65 y.o. female.  HPI      Cassandra Montoya is a 65 y.o. female with past medical history of asthma and COPD who presents to the Emergency Department complaining of increasing shortness of breath, diarrhea, generalized body aches, and cough.  Symptoms been present for 3 days.  She states she went to work this morning and tested positive for COVID.  She was tested negative on Friday.  She has been using her albuterol nebulizer at home without relief.  She also reports having frequency of urination and is concerned she has a UTI.  She denies fever, chills, abdominal pain, and vomiting.  No chest pain.  She is vaccinated x2 without a booster.   Past Medical History:  Diagnosis Date  . Asthma   . COPD (chronic obstructive pulmonary disease) (HCC)   . Ovarian cyst    Right ovarian cyst,   . Pneumonia     Patient Active Problem List   Diagnosis Date Noted  . Gastric ulcer 05/26/2018  . Abdominal pain, epigastric 01/30/2018  . Abnormal weight loss 01/30/2018  . Dysphagia 01/30/2018  . Loss of appetite 01/30/2018  . Nonpulsatile abdominal mass 01/30/2018  . Influenza A 02/14/2017  . Hypokalemia 02/14/2017  . Hyperglycemia, drug-induced 08/13/2015  . COPD with acute exacerbation (HCC) 08/12/2015  . Acute respiratory distress 08/12/2015  . Tobacco abuse 05/25/2014    Past Surgical History:  Procedure Laterality Date  . BIOPSY  03/26/2018   Procedure: BIOPSY;  Surgeon: Corbin Ade, MD;  [facility];  Service: Endoscopy;;  gastric  . BIOPSY  08/13/2018   Procedure: BIOPSY;  Surgeon: Corbin Ade, MD;  [facility];  Service: Endoscopy;;  gastric  . COLONOSCOPY N/A 03/26/2018   Dr. 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  10-year screening  colonoscopy  . ESOPHAGOGASTRODUODENOSCOPY N/A 03/26/2018   Dr. Lovena Neighbours: Esophagitis, mild Schatzki ring, gastric ulcer with multiple satellite erosions.  Esophagus was dilated.  Gastric biopsies were benign.  No H. pylori.  Recommend 42-month surveillance EGD.  Marland Kitchen ESOPHAGOGASTRODUODENOSCOPY N/A 08/13/2018   Procedure: ESOPHAGOGASTRODUODENOSCOPY (EGD);  Surgeon: Corbin Ade, MD;  [facility];  Service: Endoscopy;  Laterality: N/A;  12:15pm  . MALONEY DILATION N/A 03/26/2018   Procedure: Elease Hashimoto DILATION;  Surgeon: Corbin Ade, MD;  [facility];  Service: Endoscopy;  Laterality: N/A;  Elease Hashimoto DILATION N/A 08/13/2018   Procedure: Elease Hashimoto DILATION;  Surgeon: Corbin Ade, MD;  [facility];  Service: Endoscopy;  Laterality: N/A;     OB History   No obstetric history on file.     Family History  Problem Relation Age of Onset  . Stroke Brother   . Hypertension Mother   . Colon cancer Neg Hx     Social History   Tobacco Use  . Smoking status: Current Every Day Smoker    Packs/day: 0.50    Types: Cigarettes  . Smokeless tobacco: Never Used  Vaping Use  . Vaping Use: Never used  Substance Use Topics  . Alcohol use: No  . Drug use: No    Home Medications Prior to Admission medications   Medication Sig Start Date End Date Taking? Authorizing Provider  albuterol (  PROVENTIL HFA;VENTOLIN HFA) 108 (90 Base) MCG/ACT inhaler Inhale 1-2 puffs into the lungs every 6 (six) hours as needed for wheezing or shortness of breath.    [provider]  albuterol (PROVENTIL) (2.5 MG/3ML) 0.083% nebulizer solution Take 3 mLs (2.5 mg total) by nebulization every 4 (four) hours as needed for wheezing or shortness of breath. 02/17/17   Kathlen Mody, MD  aspirin EC 81 MG tablet Take 81 mg by mouth daily.    [provider]  Aspirin-Salicylamide-Caffeine (BC HEADACHE POWDER PO) Take 1 packet by mouth daily as needed (pain).    [provider]   azithromycin (ZITHROMAX) 250 MG tablet Take 1 tablet (250 mg total) by mouth daily. Take first 2 tablets together, then 1 every day until finished. 06/26/20   Sponseller, Lupe Carney R, PA-C  diazepam (VALIUM) 10 MG tablet Take 10 mg by mouth daily as needed. 01/05/20   [provider]  escitalopram (LEXAPRO) 5 MG tablet  10/20/19   [provider]  feeding supplement, ENSURE ENLIVE, (ENSURE ENLIVE) LIQD Take 237 mLs by mouth 2 (two) times daily between meals. Patient taking differently: Take 237 mLs by mouth 2 (two) times a week. 02/17/17   Kathlen Mody, MD  Fluticasone-Umeclidin-Vilant 100-62.5-25 MCG/INH AEPB Inhale 1 puff into the lungs daily.     [provider]  gabapentin (NEURONTIN) 300 MG capsule Take 300 mg by mouth daily.    [provider]  hydrochlorothiazide (HYDRODIURIL) 25 MG tablet Take 25 mg by mouth daily. 11/24/19   [provider]  HYDROcodone-acetaminophen (NORCO/VICODIN) 5-325 MG tablet One tablet every six hours for pain.  Limit 7 days. 04/18/20   Darreld Mclean, MD  lisinopril (ZESTRIL) 20 MG tablet 1 tablet 01/12/19   [provider]  pantoprazole (PROTONIX) 40 MG tablet Take 1 tablet (40 mg total) by mouth daily before breakfast. 01/30/18   Tiffany Kocher, PA-C  predniSONE (DELTASONE) 10 MG tablet Take 2 tablets (20 mg total) by mouth daily. 06/26/20   Sponseller, Eugene Gavia, PA-C  traMADol (ULTRAM) 50 MG tablet Take 50 mg by mouth every 6 (six) hours as needed. 09/09/19   [provider]    Allergies    Patient has no known allergies.  Review of Systems   Review of Systems  Constitutional: Negative for chills, fatigue and fever.  HENT: Negative for congestion, sore throat and trouble swallowing.   Respiratory: Positive for cough and shortness of breath. Negative for wheezing.   Cardiovascular: Negative for chest pain and leg swelling.  Gastrointestinal: Positive for diarrhea. Negative for abdominal pain, nausea  and vomiting.  Genitourinary: Positive for frequency. Negative for dysuria, flank pain and hematuria.  Musculoskeletal: Positive for myalgias. Negative for arthralgias, back pain, neck pain and neck stiffness.  Skin: Negative for rash.  Neurological: Positive for headaches. Negative for dizziness, weakness and numbness.  Hematological: Does not bruise/bleed easily.    Physical Exam Updated Vital Signs BP (!) 150/88   Pulse 90   Temp 97.8 F (36.6 C) (Oral)   Resp 20   Ht 5' (1.524 m)   Wt 70.8 kg   SpO2 96% Comment: RA  BMI 30.47 kg/m   Physical Exam Vitals and nursing note reviewed.  Constitutional:      Appearance: Normal appearance. She is not ill-appearing or toxic-appearing.  HENT:     Head: Normocephalic.  Eyes:     Pupils: Pupils are equal, round, and reactive to light.  Neck:     Thyroid: No thyromegaly.  Meningeal: Kernig's sign absent.  Cardiovascular:     Rate and Rhythm: Normal rate and regular rhythm.  Pulmonary:     Effort: Pulmonary effort is normal.     Breath sounds: No wheezing.     Comments: Lung sounds diminished bilaterally.  No rales or wheezing.  Patient speaking in clear and complete sentences without respiratory distress. Abdominal:     Palpations: Abdomen is soft.     Tenderness: There is no abdominal tenderness. There is no guarding or rebound.  Musculoskeletal:        General: Normal range of motion.     Cervical back: Normal range of motion and neck supple.     Right lower leg: No edema.     Left lower leg: No edema.  Skin:    General: Skin is warm.     Capillary Refill: Capillary refill takes less than 2 seconds.     Findings: No rash.  Neurological:     Mental Status: She is alert and oriented to person, place, and time.     ED Results / Procedures / Treatments   Labs (all labs ordered are listed, but only abnormal results are displayed) Labs Reviewed  RESP PANEL BY RT-PCR (FLU A&B, COVID) ARPGX2 - Abnormal; Notable for the  following components:      Result Value   SARS Coronavirus 2 by RT PCR POSITIVE (*)    All other components within normal limits    EKG EKG Interpretation  Date/Time:  Sunday July 24 2020 10:30:29 EDT Ventricular Rate:  82 PR Interval:  133 QRS Duration: 95 QT Interval:  371 QTC Calculation: 434 R Axis:   2 Text Interpretation: Sinus rhythm Anteroseptal infarct, old Confirmed by Norman Clay ([id number]) on 07/24/2020 10:35:33 AM   Radiology DG Chest Portable 1 View  Result Date: 07/24/2020 CLINICAL DATA:  Cough and shortness of breath.  COVID-19 positive EXAM: PORTABLE CHEST 1 VIEW COMPARISON:  Jun 26, 2020 FINDINGS: Lungs are clear. Heart size and pulmonary vascularity are normal. No adenopathy. No bone lesions. IMPRESSION: Lungs clear.  Cardiac silhouette normal. Electronically Signed   By: Bretta Bang III M.D.   On: 07/24/2020 11:39    Procedures Procedures   Medications Ordered in ED Medications  albuterol (VENTOLIN HFA) 108 (90 Base) MCG/ACT inhaler 3 puff (has no administration in time range)    ED Course  I have reviewed the triage vital signs and the nursing notes.  Pertinent labs & imaging results that were available during my care of the patient were reviewed by me and considered in my medical decision making (see chart for details).    MDM Rules/Calculators/A&P                          Patient here with 3-day history of cough, shortness of breath, diarrhea, and generalized body aches.  No fever, chest pain or chills.  Tested positive by rapid test earlier today at work.  She is vaccinated x2 without booster.  On exam patient nontoxic-appearing.  No tachycardia or tachypnea.  She does report having some dysuria there is no CVA tenderness on her exam.  Will obtain chest x-ray and urinalysis.  I have discussed treatment with Paxlovid as I feel she is a good candidate for the medication as she does have some comorbidities and onset of symptoms is within the 5-day  window.  Discussed risk associated with medication and patient verbalized understanding.  She continues to be agreeable  to treatment.  Lung sounds improved after albuterol MDI.  Chest x-ray without evidence of multifocal pneumonia.  No hypoxia or tachycardia here.  Patient did complain of dysuria symptoms and urinalysis was ordered.  She has been unable to give urine sample.  When asked about this patient states that she has an appointment with her PCP tomorrow and prefers to follow-up with PCP regarding urine test.  Discussed return precautions.  She has ambulated in the department without hypoxia or difficulty.   Cassandra Montoya was evaluated in Emergency Department on 07/24/2020 for the symptoms described in the history of present illness. She was evaluated in the context of the global COVID-19 pandemic, which necessitated consideration that the patient might be at risk for infection with the SARS-CoV-2 virus that causes COVID-19. Institutional protocols and algorithms that pertain to the evaluation of patients at risk for COVID-19 are in a state of rapid change based on information released by regulatory bodies including the CDC and federal and state organizations. These policies and algorithms were followed during the patient's care in the ED.  Final Clinical Impression(s) / ED Diagnoses Final diagnoses:  COVID-19    Rx / DC Orders ED Discharge Orders    None       Rosey Bathriplett, Bettyanne Dittman, PA-C 07/24/20 1514    Cheryll CockayneHong, Joshua S, MD 08/05/20 [phone]

## 2020-07-24 NOTE — Discharge Instructions (Signed)
Your test today showed that you have COVID.  Your chest x-ray did not show evidence of pneumonia.  You have been prescribed a antiviral medication is used to help with symptoms of COVID.  You may discuss taking this medication with your primary care provider before starting it.  You will need to start it within 5 days of the onset of your symptoms.  Please keep your appointment with your primary care provider for tomorrow.  Return to emergency department for any new or worsening symptoms.

## 2020-07-24 NOTE — ED Triage Notes (Signed)
Pt reports she tested positive for Covid this morning while at work. Pt c/o SOB and pain with breathing x 3 days.

## 2020-07-24 NOTE — ED Notes (Signed)
Date and time results received: 07/24/20 @1222  (use smartphrase ".now" to insert current time)  Test: Covid Critical Value: positive  Name of Provider Notified: Tammy Triplett  Orders Received? Or Actions Taken?: Orders Received - See Orders for details

## 2020-08-16 ENCOUNTER — Other Ambulatory Visit: Payer: Self-pay

## 2020-08-16 ENCOUNTER — Emergency Department (HOSPITAL_COMMUNITY)
Admission: EM | Admit: 2020-08-16 | Discharge: 2020-08-16 | Discharge status: Against Medical Advice | Payer: Medicare Other

## 2020-08-18 ENCOUNTER — Emergency Department (HOSPITAL_COMMUNITY)
Admission: EM | Admit: 2020-08-18 | Discharge: 2020-08-18 | Disposition: A | Discharge status: Home / Self Care | Payer: Medicare Other | Attending: Emergency Medicine | Admitting: Emergency Medicine

## 2020-08-18 ENCOUNTER — Encounter (HOSPITAL_COMMUNITY): Payer: Self-pay | Admitting: *Deleted

## 2020-08-18 ENCOUNTER — Emergency Department (HOSPITAL_COMMUNITY): Payer: Medicare Other

## 2020-08-18 ENCOUNTER — Other Ambulatory Visit: Payer: Self-pay

## 2020-08-18 DIAGNOSIS — R1011 Right upper quadrant pain: Secondary | ICD-10-CM | POA: Insufficient documentation

## 2020-08-18 DIAGNOSIS — Z7982 Long term (current) use of aspirin: Secondary | ICD-10-CM | POA: Diagnosis not present

## 2020-08-18 DIAGNOSIS — F17210 Nicotine dependence, cigarettes, uncomplicated: Secondary | ICD-10-CM | POA: Diagnosis not present

## 2020-08-18 DIAGNOSIS — R1010 Upper abdominal pain, unspecified: Secondary | ICD-10-CM

## 2020-08-18 DIAGNOSIS — R1013 Epigastric pain: Secondary | ICD-10-CM | POA: Insufficient documentation

## 2020-08-18 DIAGNOSIS — J441 Chronic obstructive pulmonary disease with (acute) exacerbation: Secondary | ICD-10-CM | POA: Insufficient documentation

## 2020-08-18 DIAGNOSIS — J45909 Unspecified asthma, uncomplicated: Secondary | ICD-10-CM | POA: Insufficient documentation

## 2020-08-18 LAB — COMPREHENSIVE METABOLIC PANEL
ALT: 28 U/L (ref 0–44)
AST: 29 U/L (ref 15–41)
Albumin: 3.5 g/dL (ref 3.5–5.0)
Alkaline Phosphatase: 104 U/L (ref 38–126)
Anion gap: 8 (ref 5–15)
BUN: 11 mg/dL (ref 8–23)
CO2: 26 mmol/L (ref 22–32)
Calcium: 8.4 mg/dL — ABNORMAL LOW (ref 8.9–10.3)
Chloride: 108 mmol/L (ref 98–111)
Creatinine, Ser: 0.47 mg/dL (ref 0.44–1.00)
GFR, Estimated: >60 mL/min (ref >60)
Glucose, Bld: 74 mg/dL (ref 70–99)
Potassium: 3.6 mmol/L (ref 3.5–5.1)
Sodium: 142 mmol/L (ref 135–145)
Total Bilirubin: 0.1 mg/dL — ABNORMAL LOW (ref 0.3–1.2)
Total Protein: 6.9 g/dL (ref 6.5–8.1)

## 2020-08-18 LAB — CBC WITH DIFFERENTIAL/PLATELET
Abs Immature Granulocytes: 0.01 10*3/uL (ref 0.00–0.07)
Basophils Absolute: 0.1 10*3/uL (ref 0.0–0.1)
Basophils Relative: 1 %
Eosinophils Absolute: 0.3 10*3/uL (ref 0.0–0.5)
Eosinophils Relative: 4 %
HCT: 42.5 % (ref 36.0–46.0)
Hemoglobin: 13.6 g/dL (ref 12.0–15.0)
Immature Granulocytes: 0 %
Lymphocytes Relative: 41 %
Lymphs Abs: 2.8 10*3/uL (ref 0.7–4.0)
MCH: 32.5 pg (ref 26.0–34.0)
MCHC: 32 g/dL (ref 30.0–36.0)
MCV: 101.7 fL — ABNORMAL HIGH (ref 80.0–100.0)
Monocytes Absolute: 1 10*3/uL (ref 0.1–1.0)
Monocytes Relative: 14 %
Neutro Abs: 2.7 10*3/uL (ref 1.7–7.7)
Neutrophils Relative %: 40 %
Platelets: 346 10*3/uL (ref 150–400)
RBC: 4.18 MIL/uL (ref 3.87–5.11)
RDW: 13.7 % (ref 11.5–15.5)
WBC: 6.9 10*3/uL (ref 4.0–10.5)
nRBC: 0 % (ref 0.0–0.2)

## 2020-08-18 LAB — LIPASE, BLOOD: Lipase: 20 U/L (ref 11–51)

## 2020-08-18 MED ORDER — GUAIFENESIN-CODEINE 100-10 MG/5ML PO SOLN
10.0000 mL | Freq: Once | ORAL | Status: AC
  Administered 2020-08-18: 10 mL via ORAL
  Filled 2020-08-18: qty 10

## 2020-08-18 MED ORDER — PANTOPRAZOLE SODIUM 20 MG PO TBEC: 20.0000 mg | DELAYED_RELEASE_TABLET | Freq: Every day | ORAL | 0 refills | Status: DC

## 2020-08-18 MED ORDER — IOHEXOL 300 MG/ML  SOLN
100.0000 mL | Freq: Once | INTRAMUSCULAR | Status: AC | PRN
  Administered 2020-08-18: 100 mL via INTRAVENOUS

## 2020-08-18 NOTE — ED Provider Notes (Signed)
Emergency Medicine Provider Triage Evaluation Note  APPHIA CROPLEY , a 65 y.o. female  was evaluated in triage.  Pt complains of presents with right upper quadrant pain, started 5 days ago, it is consistent, does not radiate, no associated nausea, vomit, diarrhea, chest pain or shortness of breath.  Has never experienced in the past..  Review of Systems  Positive: Right upper quadrant pain, Negative: Denies chest pain, shortness of breath,  Physical Exam  BP (!) 166/66 (BP Location: Right Arm)   Pulse 91   Temp 97.9 F (36.6 C) (Oral)   Resp 20   Ht 5' (1.524 m)   Wt 66.2 kg   SpO2 94%   BMI 28.51 kg/m  Gen:   Awake, no distress   Resp:  Normal effort  MSK:   Moves extremities without difficulty  Other:    Medical Decision Making  Medically screening exam initiated at 7:03 PM.  Appropriate orders placed.  [name] was informed that the remainder of the evaluation will be completed by another provider, this initial triage assessment does not replace that evaluation, and the importance of remaining in the ED until their evaluation is complete.  Patient presents with right upper quadrant pain, lab work imaging been ordered, patient will need further work-up in the emergency department.   Carroll Sage, PA-C 08/18/20 Theodoro Doing, MD 08/19/20 1038

## 2020-08-18 NOTE — ED Triage Notes (Signed)
Right side abdominal pain x 4-5 days

## 2020-08-18 NOTE — Discharge Instructions (Addendum)
Follow-up for your gallbladder ultrasound.  Follow-up with your family doctor next week for recheck.  Get the Protonix tomorrow and start taking it daily.  You can use Tylenol for pain

## 2020-08-18 NOTE — ED Provider Notes (Signed)
[facility] EMERGENCY DEPARTMENT Provider Note   CSN: [id number] Arrival date & time: 08/18/20  1812     History Chief Complaint  Patient presents with   Abdominal Pain    Cassandra Montoya is a 65 y.o. female.  Patient complains of right upper quadrant abdominal pain.  No fevers no chills no vomiting  The history is provided by the patient and medical records. No language interpreter was used.  Abdominal Pain Pain location:  Epigastric Pain quality: aching   Pain radiates to:  Does not radiate Pain severity:  Moderate Onset quality:  Sudden Timing:  Constant Progression:  Worsening Chronicity:  New Context: not alcohol use   Associated symptoms: no chest pain, no cough, no diarrhea, no fatigue and no hematuria       Past Medical History:  Diagnosis Date   Asthma    COPD (chronic obstructive pulmonary disease) (HCC)    Ovarian cyst    Right ovarian cyst,    Pneumonia     Patient Active Problem List   Diagnosis Date Noted   Gastric ulcer 05/26/2018   Abdominal pain, epigastric 01/30/2018   Abnormal weight loss 01/30/2018   Dysphagia 01/30/2018   Loss of appetite 01/30/2018   Nonpulsatile abdominal mass 01/30/2018   Influenza A 02/14/2017   Hypokalemia 02/14/2017   Hyperglycemia, drug-induced 08/13/2015   COPD with acute exacerbation (HCC) 08/12/2015   Acute respiratory distress 08/12/2015   Tobacco abuse 05/25/2014    Past Surgical History:  Procedure Laterality Date   BIOPSY  03/26/2018   Procedure: BIOPSY;  Surgeon: Corbin Ade, MD;  [facility];  Service: Endoscopy;;  gastric   BIOPSY  08/13/2018   Procedure: BIOPSY;  Surgeon: Corbin Ade, MD;  [facility];  Service: Endoscopy;;  gastric   COLONOSCOPY N/A 03/26/2018   Dr. 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  10-year screening colonoscopy   ESOPHAGOGASTRODUODENOSCOPY N/A 03/26/2018   Dr. Lovena Neighbours: Esophagitis, mild Schatzki ring, gastric ulcer with multiple  satellite erosions.  Esophagus was dilated.  Gastric biopsies were benign.  No H. pylori.  Recommend 19-month surveillance EGD.   ESOPHAGOGASTRODUODENOSCOPY N/A 08/13/2018   Procedure: ESOPHAGOGASTRODUODENOSCOPY (EGD);  Surgeon: Corbin Ade, MD;  [facility];  Service: Endoscopy;  Laterality: N/A;  12:15pm   MALONEY DILATION N/A 03/26/2018   Procedure: Elease Hashimoto DILATION;  Surgeon: Corbin Ade, MD;  [facility];  Service: Endoscopy;  Laterality: N/A;   MALONEY DILATION N/A 08/13/2018   Procedure: Elease Hashimoto DILATION;  Surgeon: Corbin Ade, MD;  [facility];  Service: Endoscopy;  Laterality: N/A;     OB History   No obstetric history on file.     Family History  Problem Relation Age of Onset   Stroke Brother    Hypertension Mother    Colon cancer Neg Hx     Social History   Tobacco Use   Smoking status: Every Day    Packs/day: 0.50    Pack years: 0.00    Types: Cigarettes   Smokeless tobacco: Never  Vaping Use   Vaping Use: Never used  Substance Use Topics   Alcohol use: No   Drug use: No    Home Medications Prior to Admission medications   Medication Sig Start Date End Date Taking? Authorizing Provider  albuterol (PROVENTIL HFA;VENTOLIN HFA) 108 (90 Base) MCG/ACT inhaler Inhale 1-2 puffs into the lungs every 6 (six) hours as needed for wheezing or  shortness of breath.   Yes [provider]  albuterol (PROVENTIL) (2.5 MG/3ML) 0.083% nebulizer solution Take 3 mLs (2.5 mg total) by nebulization every 6 (six) hours as needed for wheezing or shortness of breath. 07/24/20  Yes Triplett, Tammy, PA-C  aspirin EC 81 MG tablet Take 81 mg by mouth daily.   Yes [provider]  Aspirin-Salicylamide-Caffeine (BC HEADACHE POWDER PO) Take 1 packet by mouth daily as needed (pain).   Yes [provider]  feeding supplement, ENSURE ENLIVE, (ENSURE ENLIVE) LIQD Take 237 mLs by mouth 2 (two) times daily between meals. Patient  taking differently: Take 237 mLs by mouth 2 (two) times a week. 02/17/17  Yes Kathlen ModyAkula, Vijaya, MD  pantoprazole (PROTONIX) 20 MG tablet Take 1 tablet (20 mg total) by mouth daily. 08/18/20  Yes Bethann BerkshireZammit, Christopherjame Carnell, MD  azithromycin (ZITHROMAX) 250 MG tablet Take 1 tablet (250 mg total) by mouth daily. Take first 2 tablets together, then 1 every day until finished. Patient not taking: No sig reported 06/26/20   Sponseller, Lupe Carneyebekah R, PA-C  benzonatate (TESSALON) 200 MG capsule Take 1 capsule (200 mg total) by mouth 3 (three) times daily as needed for cough. Swallow whole, do not chew Patient not taking: No sig reported 07/24/20   Triplett, Tammy, PA-C  diazepam (VALIUM) 10 MG tablet Take 10 mg by mouth daily as needed. Patient not taking: No sig reported 01/05/20   [provider]  diclofenac (VOLTAREN) 50 MG EC tablet Take 50 mg by mouth 2 (two) times daily. Patient not taking: No sig reported 07/11/20   [provider]  escitalopram (LEXAPRO) 5 MG tablet  10/20/19   [provider]  Fluticasone-Umeclidin-Vilant 100-62.5-25 MCG/INH AEPB Inhale 1 puff into the lungs daily.  Patient not taking: Reported on 08/18/2020    [provider]  gabapentin (NEURONTIN) 300 MG capsule Take 300 mg by mouth daily. Patient not taking: Reported on 08/18/2020    [provider]  hydrochlorothiazide (HYDRODIURIL) 25 MG tablet Take 25 mg by mouth daily. Patient not taking: No sig reported 11/24/19   [provider]  HYDROcodone-acetaminophen (NORCO/VICODIN) 5-325 MG tablet One tablet every six hours for pain.  Limit 7 days. Patient not taking: No sig reported 04/18/20   Darreld McleanKeeling, Wayne, MD  lisinopril (ZESTRIL) 20 MG tablet 1 tablet Patient not taking: No sig reported 01/12/19   [provider]  predniSONE (DELTASONE) 10 MG tablet Take 2 tablets (20 mg total) by mouth daily. Patient not taking: No sig reported 06/26/20   Sponseller, Lupe Carneyebekah R, PA-C  traMADol (ULTRAM) 50  MG tablet Take 50 mg by mouth every 6 (six) hours as needed. Patient not taking: No sig reported 09/09/19   [provider]    Allergies    Patient has no known allergies.  Review of Systems   Review of Systems  Constitutional:  Negative for appetite change and fatigue.  HENT:  Negative for congestion, ear discharge and sinus pressure.   Eyes:  Negative for discharge.  Respiratory:  Negative for cough.   Cardiovascular:  Negative for chest pain.  Gastrointestinal:  Positive for abdominal pain. Negative for diarrhea.  Genitourinary:  Negative for frequency and hematuria.  Musculoskeletal:  Negative for back pain.  Skin:  Negative for rash.  Neurological:  Negative for seizures and headaches.  Psychiatric/Behavioral:  Negative for hallucinations.    Physical Exam Updated Vital Signs BP (!) 169/82   Pulse 72   Temp 97.9 F (36.6 C) (Oral)   Resp Marland Kitchen(!)  24   Ht 5' (1.524 m)   Wt 66.2 kg   SpO2 98%   BMI 28.51 kg/m   Physical Exam Nursing note reviewed.  Constitutional:      Appearance: She is well-developed.  HENT:     Head: Normocephalic.     Nose: Nose normal.  Eyes:     General: No scleral icterus.    Conjunctiva/sclera: Conjunctivae normal.  Neck:     Thyroid: No thyromegaly.  Cardiovascular:     Rate and Rhythm: Normal rate and regular rhythm.     Heart sounds: No murmur heard.   No friction rub. No gallop.  Pulmonary:     Breath sounds: No stridor. No wheezing or rales.  Chest:     Chest wall: No tenderness.  Abdominal:     General: There is no distension.     Tenderness: There is abdominal tenderness. There is no rebound.  Musculoskeletal:        General: Normal range of motion.     Cervical back: Neck supple.  Lymphadenopathy:     Cervical: No cervical adenopathy.  Skin:    Findings: No erythema or rash.  Neurological:     Mental Status: She is alert and oriented to person, place, and time.     Motor: No abnormal muscle tone.      Coordination: Coordination normal.  Psychiatric:        Behavior: Behavior normal.    ED Results / Procedures / Treatments   Labs (all labs ordered are listed, but only abnormal results are displayed) Labs Reviewed  COMPREHENSIVE METABOLIC PANEL - Abnormal; Notable for the following components:      Result Value   Calcium 8.4 (*)    Total Bilirubin 0.1 (*)    All other components within normal limits  CBC WITH DIFFERENTIAL/PLATELET - Abnormal; Notable for the following components:   MCV 101.7 (*)    All other components within normal limits  LIPASE, BLOOD  URINALYSIS, ROUTINE W REFLEX MICROSCOPIC    EKG None  Radiology DG Chest 2 View  Result Date: 08/18/2020 CLINICAL DATA:  65 year old female with shortness of breath. EXAM: CHEST - 2 VIEW COMPARISON:  Chest radiograph dated 07/24/2020. FINDINGS: No focal consolidation, pleural effusion, pneumothorax. The cardiac silhouette is within limits. No acute osseous pathology. IMPRESSION: No active cardiopulmonary disease. Electronically Signed   By: Elgie Collard M.D.   On: 08/18/2020 19:31   CT ABDOMEN PELVIS W CONTRAST  Result Date: 08/18/2020 CLINICAL DATA:  65 year old female with abdominal pain. EXAM: CT ABDOMEN AND PELVIS WITH CONTRAST TECHNIQUE: Multidetector CT imaging of the abdomen and pelvis was performed using the standard protocol following bolus administration of intravenous contrast. CONTRAST:  OMNIPAQUE IOHEXOL 300 MG/ML  SOLN COMPARISON:  CT abdomen pelvis dated 03/04/2018. FINDINGS: Lower chest: The visualized lung bases are clear. No intra-abdominal free air or free fluid. Hepatobiliary: No focal liver abnormality is seen. No gallstones, gallbladder wall thickening, or biliary dilatation. Pancreas: Unremarkable. No pancreatic ductal dilatation or surrounding inflammatory changes. Spleen: Normal in size without focal abnormality. Adrenals/Urinary Tract: The adrenal glands unremarkable. There is no hydronephrosis on  either side. There is symmetric enhancement and excretion of contrast by both kidneys. The visualized ureters and urinary bladder appear unremarkable. Stomach/Bowel: There is no bowel obstruction or active inflammation. Several normal caliber fecalized loops of distal small bowel may represent increased transit time or small intestinal bacterial overgrowth. The appendix is normal. Vascular/Lymphatic: Mild aortoiliac atherosclerotic disease. The IVC is unremarkable.  No portal venous gas. There is no adenopathy. Reproductive: The uterus and ovaries are grossly unremarkable. No adnexal masses. Other: Small fat containing midline supraumbilical ventral hernia. There is mild stranding the herniated fat similar to prior CT. Correlation with clinical exam and point tenderness recommended to evaluate for possibility of strangulation or incarceration. Musculoskeletal: Osteopenia with degenerative changes of the spine. No acute osseous pathology. IMPRESSION: 1. No acute intra-abdominal or pelvic pathology. No bowel obstruction. Normal appendix. 2. Small fat containing midline supraumbilical ventral hernia with mild stranding of the herniated fat similar to prior CT. Correlation with clinical exam and point tenderness recommended to evaluate for possibility of strangulation or incarceration. 3. Aortic Atherosclerosis (ICD10-I70.0). Electronically Signed   By: Elgie Collard M.D.   On: 08/18/2020 22:51    Procedures Procedures   Medications Ordered in ED Medications  guaiFENesin-codeine 100-10 MG/5ML solution 10 mL (10 mLs Oral Given 08/18/20 2156)  iohexol (OMNIPAQUE) 300 MG/ML solution 100 mL (100 mLs Intravenous Contrast Given 08/18/20 2234)    ED Course  I have reviewed the triage vital signs and the nursing notes.  Pertinent labs & imaging results that were available during my care of the patient were reviewed by me and considered in my medical decision making (see chart for details).    MDM  Rules/Calculators/A&P     Patient with right upper quadrant abdominal pain.  Labs unremarkable CT scan unremarkable.  Patient is put on protonic and outpatient ultrasound of gallbladder ordered                    Final Clinical Impression(s) / ED Diagnoses Final diagnoses:  Pain of upper abdomen    Rx / DC Orders ED Discharge Orders          Ordered    pantoprazole (PROTONIX) 20 MG tablet  Daily        08/18/20 2313    US ABDOMEN LIMITED RUQ (LIVER/GB)        08/18/20 2314             Bethann Berkshire, MD 08/19/20 1143

## 2020-10-01 ENCOUNTER — Other Ambulatory Visit: Payer: Self-pay

## 2020-10-01 ENCOUNTER — Encounter (HOSPITAL_COMMUNITY): Payer: Self-pay | Admitting: Emergency Medicine

## 2020-10-01 ENCOUNTER — Emergency Department (HOSPITAL_COMMUNITY)
Admission: EM | Admit: 2020-10-01 | Discharge: 2020-10-01 | Disposition: A | Discharge status: Home / Self Care | Payer: Medicare Other | Attending: Emergency Medicine | Admitting: Emergency Medicine

## 2020-10-01 DIAGNOSIS — J45909 Unspecified asthma, uncomplicated: Secondary | ICD-10-CM | POA: Diagnosis not present

## 2020-10-01 DIAGNOSIS — Z7951 Long term (current) use of inhaled steroids: Secondary | ICD-10-CM | POA: Diagnosis not present

## 2020-10-01 DIAGNOSIS — I10 Essential (primary) hypertension: Secondary | ICD-10-CM | POA: Diagnosis not present

## 2020-10-01 DIAGNOSIS — Z79899 Other long term (current) drug therapy: Secondary | ICD-10-CM | POA: Insufficient documentation

## 2020-10-01 DIAGNOSIS — F17210 Nicotine dependence, cigarettes, uncomplicated: Secondary | ICD-10-CM | POA: Diagnosis not present

## 2020-10-01 DIAGNOSIS — M4726 Other spondylosis with radiculopathy, lumbar region: Secondary | ICD-10-CM

## 2020-10-01 DIAGNOSIS — Z7982 Long term (current) use of aspirin: Secondary | ICD-10-CM | POA: Insufficient documentation

## 2020-10-01 DIAGNOSIS — J449 Chronic obstructive pulmonary disease, unspecified: Secondary | ICD-10-CM | POA: Insufficient documentation

## 2020-10-01 DIAGNOSIS — M545 Low back pain, unspecified: Secondary | ICD-10-CM | POA: Diagnosis present

## 2020-10-01 MED ORDER — TRAMADOL HCL 50 MG PO TABS: 50.0000 mg | ORAL_TABLET | Freq: Four times a day (QID) | ORAL | 0 refills | Status: DC | PRN

## 2020-10-01 MED ORDER — PREDNISONE 20 MG PO TABS: 20.0000 mg | ORAL_TABLET | Freq: Two times a day (BID) | ORAL | 0 refills | Status: DC

## 2020-10-01 MED ORDER — PREDNISONE 50 MG PO TABS
60.0000 mg | ORAL_TABLET | Freq: Once | ORAL | Status: AC
  Administered 2020-10-01: 60 mg via ORAL
  Filled 2020-10-01: qty 1

## 2020-10-01 MED ORDER — LISINOPRIL 20 MG PO TABS: 20.0000 mg | ORAL_TABLET | Freq: Every day | ORAL | 1 refills | Status: AC

## 2020-10-01 NOTE — Discharge Instructions (Addendum)
The pain in your back and right leg is probably from arthritis of your lower back.  We are prescribing prednisone to use instead of the diclofenac, to help your discomfort.  Try using heat on the lower back.  We are also giving you prescription for tramadol to use for pain relief, and restart your lisinopril. Follow-up with your PCP for blood pressure check in 1 week.  See Dr. Hilda Lias, later this month as scheduled.

## 2020-10-01 NOTE — ED Provider Notes (Signed)
[facility] EMERGENCY DEPARTMENT Provider Note   CSN: [id number] Arrival date & time: 10/01/20  1045     History No chief complaint on file.   Cassandra Montoya is a 65 y.o. female.  HPI She complains of chronic right lower back pain radiating to right leg, previously evaluated MRI imaging.  She plans on seeing her orthopedist, Dr. Hilda Lias, later this month.  She is currently being managed by physicians at an urgent care.  She was first tried diclofenac, and has not had any improvement of her pain.  She denies change in bowel or urinary habits.  She denies fever, chills, nausea or vomiting.  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  She is not taking her prescribed medications with the exception of albuterol and diclofenac.  She states that she ran out.  There are no other known active modifying factors    Past Medical History:  Diagnosis Date   Asthma    COPD (chronic obstructive pulmonary disease) (HCC)    Ovarian cyst    Right ovarian cyst,    Pneumonia     Patient Active Problem List   Diagnosis Date Noted   Gastric ulcer 05/26/2018   Abdominal pain, epigastric 01/30/2018   Abnormal weight loss 01/30/2018   Dysphagia 01/30/2018   Loss of appetite 01/30/2018   Nonpulsatile abdominal mass 01/30/2018   Influenza A 02/14/2017   Hypokalemia 02/14/2017   Hyperglycemia, drug-induced 08/13/2015   COPD with acute exacerbation (HCC) 08/12/2015   Acute respiratory distress 08/12/2015   Tobacco abuse 05/25/2014    Past Surgical History:  Procedure Laterality Date   BIOPSY  03/26/2018   Procedure: BIOPSY;  Surgeon: Corbin Ade, MD;  [facility];  Service: Endoscopy;;  gastric   BIOPSY  08/13/2018   Procedure: BIOPSY;  Surgeon: Corbin Ade, MD;  [facility];  Service: Endoscopy;;  gastric   COLONOSCOPY N/A 03/26/2018   Dr. 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  10-year screening colonoscopy   ESOPHAGOGASTRODUODENOSCOPY N/A 03/26/2018   Dr.  Lovena Neighbours: Esophagitis, mild Schatzki ring, gastric ulcer with multiple satellite erosions.  Esophagus was dilated.  Gastric biopsies were benign.  No H. pylori.  Recommend 11-month surveillance EGD.   ESOPHAGOGASTRODUODENOSCOPY N/A 08/13/2018   Procedure: ESOPHAGOGASTRODUODENOSCOPY (EGD);  Surgeon: Corbin Ade, MD;  [facility];  Service: Endoscopy;  Laterality: N/A;  12:15pm   MALONEY DILATION N/A 03/26/2018   Procedure: Elease Hashimoto DILATION;  Surgeon: Corbin Ade, MD;  [facility];  Service: Endoscopy;  Laterality: N/A;   MALONEY DILATION N/A 08/13/2018   Procedure: Elease Hashimoto DILATION;  Surgeon: Corbin Ade, MD;  [facility];  Service: Endoscopy;  Laterality: N/A;     OB History   No obstetric history on file.     Family History  Problem Relation Age of Onset   Stroke Brother    Hypertension Mother    Colon cancer Neg Hx     Social History   Tobacco Use   Smoking status: Every Day    Packs/day: 0.50    Types: Cigarettes   Smokeless tobacco: Never  Vaping Use   Vaping Use: Never used  Substance Use Topics   Alcohol use: No   Drug use: No    Home Medications Prior to Admission medications   Medication Sig Start Date End Date Taking? Authorizing Provider  predniSONE (DELTASONE) 20 MG tablet Take 1 tablet (20 mg total) by mouth  2 (two) times daily. 10/01/20  Yes Cassandra Bale, MD  albuterol (PROVENTIL HFA;VENTOLIN HFA) 108 (90 Base) MCG/ACT inhaler Inhale 1-2 puffs into the lungs every 6 (six) hours as needed for wheezing or shortness of breath.    [provider]  albuterol (PROVENTIL) (2.5 MG/3ML) 0.083% nebulizer solution Take 3 mLs (2.5 mg total) by nebulization every 6 (six) hours as needed for wheezing or shortness of breath. 07/24/20   Triplett, Tammy, PA-C  aspirin EC 81 MG tablet Take 81 mg by mouth daily.    [provider]  Aspirin-Salicylamide-Caffeine (BC HEADACHE POWDER PO) Take 1 packet by mouth daily as needed  (pain).    [provider]  azithromycin (ZITHROMAX) 250 MG tablet Take 1 tablet (250 mg total) by mouth daily. Take first 2 tablets together, then 1 every day until finished. Patient not taking: No sig reported 06/26/20   Sponseller, Lupe Carney R, PA-C  benzonatate (TESSALON) 200 MG capsule Take 1 capsule (200 mg total) by mouth 3 (three) times daily as needed for cough. Swallow whole, do not chew Patient not taking: No sig reported 07/24/20   Triplett, Tammy, PA-C  diazepam (VALIUM) 10 MG tablet Take 10 mg by mouth daily as needed. Patient not taking: No sig reported 01/05/20   [provider]  diclofenac (VOLTAREN) 50 MG EC tablet Take 50 mg by mouth 2 (two) times daily. Patient not taking: No sig reported 07/11/20   [provider]  escitalopram (LEXAPRO) 5 MG tablet  10/20/19   [provider]  feeding supplement, ENSURE ENLIVE, (ENSURE ENLIVE) LIQD Take 237 mLs by mouth 2 (two) times daily between meals. Patient taking differently: Take 237 mLs by mouth 2 (two) times a week. 02/17/17   Kathlen Mody, MD  Fluticasone-Umeclidin-Vilant 100-62.5-25 MCG/INH AEPB Inhale 1 puff into the lungs daily.  Patient not taking: Reported on 08/18/2020    [provider]  gabapentin (NEURONTIN) 300 MG capsule Take 300 mg by mouth daily. Patient not taking: Reported on 08/18/2020    [provider]  hydrochlorothiazide (HYDRODIURIL) 25 MG tablet Take 25 mg by mouth daily. Patient not taking: No sig reported 11/24/19   [provider]  HYDROcodone-acetaminophen (NORCO/VICODIN) 5-325 MG tablet One tablet every six hours for pain.  Limit 7 days. Patient not taking: No sig reported 04/18/20   Darreld Mclean, MD  lisinopril (ZESTRIL) 20 MG tablet Take 1 tablet (20 mg total) by mouth daily. 10/01/20   Cassandra Bale, MD  pantoprazole (PROTONIX) 20 MG tablet Take 1 tablet (20 mg total) by mouth daily. 08/18/20   Cassandra Berkshire, MD  predniSONE (DELTASONE) 10 MG  tablet Take 2 tablets (20 mg total) by mouth daily. Patient not taking: No sig reported 06/26/20   Sponseller, Lupe Carney R, PA-C  traMADol (ULTRAM) 50 MG tablet Take 1 tablet (50 mg total) by mouth every 6 (six) hours as needed for moderate pain or severe pain. 10/01/20   Cassandra Bale, MD    Allergies    Patient has no known allergies.  Review of Systems   Review of Systems  All other systems reviewed and are negative.  Physical Exam Updated Vital Signs BP (!) 174/81   Pulse 73   Temp 97.9 F (36.6 C) (Oral)   Resp 18   Ht 4\' 9"  (1.448 m)   Wt 66.2 kg   SpO2 100%   BMI 31.59 kg/m   Physical Exam Vitals and nursing note reviewed.  Constitutional:      Appearance: She is  well-developed. She is not ill-appearing.  HENT:     Head: Normocephalic and atraumatic.     Right Ear: External ear normal.     Left Ear: External ear normal.  Eyes:     Conjunctiva/sclera: Conjunctivae normal.     Pupils: Pupils are equal, round, and reactive to light.  Neck:     Trachea: Phonation normal.  Cardiovascular:     Rate and Rhythm: Normal rate.  Pulmonary:     Effort: Pulmonary effort is normal.  Abdominal:     General: There is no distension.     Palpations: Abdomen is soft.     Tenderness: There is no abdominal tenderness.  Musculoskeletal:        General: No swelling or tenderness. Normal range of motion.     Cervical back: Normal range of motion and neck supple.  Skin:    General: Skin is warm and dry.  Neurological:     Mental Status: She is alert and oriented to person, place, and time.     Cranial Nerves: No cranial nerve deficit.     Sensory: No sensory deficit.     Motor: No abnormal muscle tone.     Coordination: Coordination normal.  Psychiatric:        Mood and Affect: Mood normal.        Behavior: Behavior normal.        Thought Content: Thought content normal.        Judgment: Judgment normal.    ED Results / Procedures / Treatments   Labs (all labs ordered are  listed, but only abnormal results are displayed) Labs Reviewed - No data to display  EKG None  Radiology No results found.  Procedures Procedures   Medications Ordered in ED Medications  predniSONE (DELTASONE) tablet 60 mg (60 mg Oral Given 10/01/20 1154)    ED Course  I have reviewed the triage vital signs and the nursing notes.  Pertinent labs & imaging results that were available during my care of the patient were reviewed by me and considered in my medical decision making (see chart for details).    MDM Rules/Calculators/A&P                            Patient Vitals for the past 24 hrs:  BP Temp Temp src Pulse Resp SpO2 Height Weight  10/01/20 1106 (!) 174/81 97.9 F (36.6 C) Oral 73 18 100 % -- --  10/01/20 1106 -- -- -- -- -- -- 4\' 9"  (1.448 m) 66.2 kg    12:36 PM Reevaluation with update and discussion. After initial assessment and treatment, an updated evaluation reveals no further complaints, findings discussed with the patient all questions were answered. Cassandra BaleElliott Shanicka Oldenkamp   Medical Decision Making:  This patient is presenting for evaluation of low back pain rating to right leg, which does require a range of treatment options, and is a complaint that involves a moderate risk of morbidity and mortality. The differential diagnoses include muscle strain, cauda equina syndrome, degenerative joint disease. I decided to review old records, and in summary elderly female, not taking usual prescribed medicines presenting with low back pain, not improving with NSAID.  No worrisome red flags.  I did not additional historical information from anyone.    Critical Interventions-clinical evaluation, medication treatment, discussion with patient  After These Interventions, the Patient was reevaluated and was found stable for discharge.  Patient with degenerative joint disease previously  evaluated by MRI recently, with plans to follow-up with her orthopedist soon.  She has not  responded to NSAIDs so we will give a short course of prednisone.  Mild hypertension, we will reinitiate prescription of lisinopril.  CRITICAL CARE-no Performed by: Cassandra Montoya  Nursing Notes Reviewed/ Care Coordinated Applicable Imaging Reviewed Interpretation of Laboratory Data incorporated into ED treatment  The patient appears reasonably screened and/or stabilized for discharge and I doubt any other medical condition or other [facility] requiring further screening, evaluation, or treatment in the ED at this time prior to discharge.  Plan: Home Medications-continue usual prescribed medications; Home Treatments-heat to affected area, gradually increase activity; return here if the recommended treatment, does not improve the symptoms; Recommended follow up-PCP 1 week for blood pressure check, orthopedics as scheduled later this month     Final Clinical Impression(s) / ED Diagnoses Final diagnoses:  Osteoarthritis of spine with radiculopathy, lumbar region  Hypertension, unspecified type    Rx / DC Orders ED Discharge Orders          Ordered    predniSONE (DELTASONE) 20 MG tablet  2 times daily        10/01/20 1243    traMADol (ULTRAM) 50 MG tablet  Every 6 hours PRN        10/01/20 1243    lisinopril (ZESTRIL) 20 MG tablet  Daily        10/01/20 1243             Cassandra Bale, MD 10/01/20 1244

## 2020-10-01 NOTE — ED Triage Notes (Signed)
Pt c/o chronic back and leg pain "going on for quite some time." Pt seen at [facility] "a few days ago and got no answers." Pt has appt to follow up with specialist on 10/18/20.

## 2020-10-06 ENCOUNTER — Encounter: Payer: Self-pay | Admitting: Orthopaedic Surgery

## 2020-10-06 ENCOUNTER — Other Ambulatory Visit: Payer: Self-pay

## 2020-10-06 ENCOUNTER — Ambulatory Visit (INDEPENDENT_AMBULATORY_CARE_PROVIDER_SITE_OTHER): Payer: Medicare Other | Admitting: Orthopaedic Surgery

## 2020-10-06 VITALS — BP 177/84 | HR 69 | Ht <= 58 in | Wt 149.8 lb

## 2020-10-06 DIAGNOSIS — M79604 Pain in right leg: Secondary | ICD-10-CM | POA: Diagnosis not present

## 2020-10-06 DIAGNOSIS — M79605 Pain in left leg: Secondary | ICD-10-CM

## 2020-10-06 DIAGNOSIS — M545 Low back pain, unspecified: Secondary | ICD-10-CM

## 2020-10-06 MED ORDER — NAPROXEN 500 MG PO TABS: 500.0000 mg | ORAL_TABLET | Freq: Two times a day (BID) | ORAL | 5 refills | Status: DC

## 2020-10-06 MED ORDER — HYDROCODONE-ACETAMINOPHEN 5-325 MG PO TABS: 1.0000 | ORAL_TABLET | ORAL | 0 refills | Status: AC | PRN

## 2020-10-06 NOTE — Patient Instructions (Signed)
Out of work 

## 2020-10-06 NOTE — Progress Notes (Signed)
My back is worse.  She has had increasing pain in the lower back with more radiation to the right side.  She went to the ER on 10-01-20.  I have reviewed the notes.  She has had injection epidural by Dr. Alvester Morin in February but only had one injection.  She has no new trauma.  Her pain has not improved.  She has been on prednisone, diclofenac and the ER visit.  She also takes Neurontin.    She has no weakness.  She has no bowel or bladder problems.  She has tried ice, heat and rubs also with no help.  Spine/Pelvis examination:  Inspection:  Overall, sacoiliac joint benign and hips nontender; without crepitus or defects.   Thoracic spine inspection: Alignment normal without kyphosis present   Lumbar spine inspection:  Alignment  with normal lumbar lordosis, without scoliosis apparent.   Thoracic spine palpation:  without tenderness of spinal processes   Lumbar spine palpation: with tenderness of lumbar area; with tightness of lumbar muscles    Range of Motion:   Lumbar flexion, forward flexion is 35 with pain or tenderness    Lumbar extension is 5 with pain or tenderness   Left lateral bend is Abnormal- 5   with pain or tenderness   Right lateral bend is Abnormal- 5  with pain or tenderness   Straight leg raising is Normal   Strength & tone: Normal   Stability overall normal stability  Encounter Diagnosis  Name Primary?   Lumbar pain with radiation down both legs Yes   I will give pain medicine.  I have reviewed the [place] Controlled Substance Reporting System web site prior to prescribing narcotic medicine for this patient.  Stop the diclofenac.  Begin Naprosyn 500 po bid pc.  Begin PT.  Call Dr. Alvester Morin for possible new epidural.  Return in two weeks.  Call if any problem.  Precautions discussed.  Electronically Signed Darreld Mclean, MD 8/18/20228:25 AM

## 2020-10-06 NOTE — Addendum Note (Signed)
Addended by: Cherre Huger E on: 10/06/2020 11:51 AM   Modules accepted: Orders

## 2020-10-18 ENCOUNTER — Encounter (HOSPITAL_COMMUNITY): Payer: Self-pay | Admitting: Emergency Medicine

## 2020-10-18 ENCOUNTER — Other Ambulatory Visit: Payer: Self-pay

## 2020-10-18 ENCOUNTER — Ambulatory Visit: Payer: Medicare Other | Admitting: Orthopaedic Surgery

## 2020-10-18 ENCOUNTER — Emergency Department (HOSPITAL_COMMUNITY)
Admission: EM | Admit: 2020-10-18 | Discharge: 2020-10-18 | Disposition: A | Discharge status: Home / Self Care | Payer: Medicare Other | Attending: Emergency Medicine | Admitting: Emergency Medicine

## 2020-10-18 DIAGNOSIS — J45909 Unspecified asthma, uncomplicated: Secondary | ICD-10-CM | POA: Diagnosis not present

## 2020-10-18 DIAGNOSIS — J449 Chronic obstructive pulmonary disease, unspecified: Secondary | ICD-10-CM | POA: Insufficient documentation

## 2020-10-18 DIAGNOSIS — F17210 Nicotine dependence, cigarettes, uncomplicated: Secondary | ICD-10-CM | POA: Diagnosis not present

## 2020-10-18 DIAGNOSIS — M5417 Radiculopathy, lumbosacral region: Secondary | ICD-10-CM

## 2020-10-18 DIAGNOSIS — Z7951 Long term (current) use of inhaled steroids: Secondary | ICD-10-CM | POA: Insufficient documentation

## 2020-10-18 DIAGNOSIS — Z7982 Long term (current) use of aspirin: Secondary | ICD-10-CM | POA: Insufficient documentation

## 2020-10-18 DIAGNOSIS — M545 Low back pain, unspecified: Secondary | ICD-10-CM | POA: Diagnosis present

## 2020-10-18 MED ORDER — HYDROMORPHONE HCL 2 MG/ML IJ SOLN
2.0000 mg | Freq: Once | INTRAMUSCULAR | Status: AC
  Administered 2020-10-18: 2 mg via INTRAMUSCULAR
  Filled 2020-10-18: qty 1

## 2020-10-18 NOTE — ED Triage Notes (Signed)
Pt c/o back/leg pain for a while and has been seen for the same.

## 2020-10-18 NOTE — Discharge Instructions (Addendum)

## 2020-10-18 NOTE — ED Provider Notes (Signed)
[facility] EMERGENCY DEPARTMENT Provider Note   CSN: [id number] Arrival date & time: 10/18/20  7939     History Chief Complaint  Patient presents with   Back Pain    Cassandra BEECHER is a 65 y.o. female.  The history is provided by the patient.  Back Pain Location:  Lumbar spine Quality:  Aching and burning Radiates to:  R thigh Pain severity:  Severe Onset quality:  Gradual Timing:  Intermittent Progression:  Worsening Chronicity:  Recurrent Relieved by:  Nothing Worsened by:  Ambulation (movement) Associated symptoms: no abdominal pain, no bladder incontinence, no bowel incontinence, no fever and no weakness   Patient with history of COPD presents with low back pain. She reports she has had this pain for a while but has been getting worse.  She has been seen as an outpatient and given medications but no relief.  No recent falls or trauma.  No previous back surgery.  No leg weakness.  No incontinence.  Pain starts in her low back and radiates into her right posterior thigh    Patient also reports recent constipation due to pain meds but was able to recently pass stool No abdominal pain reported Past Medical History:  Diagnosis Date   Asthma    COPD (chronic obstructive pulmonary disease) (HCC)    Ovarian cyst    Right ovarian cyst,    Pneumonia     Patient Active Problem List   Diagnosis Date Noted   Gastric ulcer 05/26/2018   Abdominal pain, epigastric 01/30/2018   Abnormal weight loss 01/30/2018   Dysphagia 01/30/2018   Loss of appetite 01/30/2018   Nonpulsatile abdominal mass 01/30/2018   Influenza A 02/14/2017   Hypokalemia 02/14/2017   Hyperglycemia, drug-induced 08/13/2015   COPD with acute exacerbation (HCC) 08/12/2015   Acute respiratory distress 08/12/2015   Tobacco abuse 05/25/2014    Past Surgical History:  Procedure Laterality Date   BIOPSY  03/26/2018   Procedure: BIOPSY;  Surgeon: Cassandra Ade, MD;  [facility]: [facility];  Service:  Endoscopy;;  gastric   BIOPSY  08/13/2018   Procedure: BIOPSY;  Surgeon: Cassandra Ade, MD;  Location: [facility];  Service: Endoscopy;;  gastric   COLONOSCOPY N/A 03/26/2018   Dr. 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  10-year screening colonoscopy   ESOPHAGOGASTRODUODENOSCOPY N/A 03/26/2018   Dr. Lovena Montoya: Esophagitis, mild Schatzki ring, gastric ulcer with multiple satellite erosions.  Esophagus was dilated.  Gastric biopsies were benign.  No H. pylori.  Recommend 62-month surveillance EGD.   ESOPHAGOGASTRODUODENOSCOPY N/A 08/13/2018   Procedure: ESOPHAGOGASTRODUODENOSCOPY (EGD);  Surgeon: Cassandra Ade, MD;  Location: [facility];  Service: Endoscopy;  Laterality: N/A;  12:15pm   MALONEY DILATION N/A 03/26/2018   Procedure: Elease Hashimoto DILATION;  Surgeon: Cassandra Ade, MD;  Location: [facility];  Service: Endoscopy;  Laterality: N/A;   MALONEY DILATION N/A 08/13/2018   Procedure: Elease Hashimoto DILATION;  Surgeon: Cassandra Ade, MD;  Location: [facility];  Service: Endoscopy;  Laterality: N/A;     OB History   No obstetric history on file.     Family History  Problem Relation Age of Onset   Stroke Brother    Hypertension Mother    Colon cancer Neg Hx     Social History   Tobacco Use   Smoking status: Every Day    Packs/day: 0.50    Types: Cigarettes   Smokeless tobacco: Never  Vaping Use  Vaping Use: Never used  Substance Use Topics   Alcohol use: No   Drug use: No    Home Medications Prior to Admission medications   Medication Sig Start Date End Date Taking? Authorizing Provider  albuterol (PROVENTIL HFA;VENTOLIN HFA) 108 (90 Base) MCG/ACT inhaler Inhale 1-2 puffs into the lungs every 6 (six) hours as needed for wheezing or shortness of breath.    [provider]  albuterol (PROVENTIL) (2.5 MG/3ML) 0.083% nebulizer solution Take 3 mLs (2.5 mg total) by nebulization every 6 (six) hours as needed for wheezing or shortness of breath. 07/24/20    Triplett, Tammy, PA-C  aspirin EC 81 MG tablet Take 81 mg by mouth daily.    [provider]  Aspirin-Salicylamide-Caffeine (BC HEADACHE POWDER PO) Take 1 packet by mouth daily as needed (pain).    [provider]  escitalopram (LEXAPRO) 5 MG tablet  10/20/19   [provider]  feeding supplement, ENSURE ENLIVE, (ENSURE ENLIVE) LIQD Take 237 mLs by mouth 2 (two) times daily between meals. Patient taking differently: Take 237 mLs by mouth 2 (two) times a week. 02/17/17   Cassandra Mody, MD  Fluticasone-Umeclidin-Vilant 100-62.5-25 MCG/INH AEPB Inhale 1 puff into the lungs daily.    [provider]  gabapentin (NEURONTIN) 300 MG capsule Take 300 mg by mouth daily.    [provider]  hydrochlorothiazide (HYDRODIURIL) 25 MG tablet Take 25 mg by mouth daily. 11/24/19   [provider]  lisinopril (ZESTRIL) 20 MG tablet Take 1 tablet (20 mg total) by mouth daily. 10/01/20   Cassandra Bale, MD  naproxen (NAPROSYN) 500 MG tablet Take 1 tablet (500 mg total) by mouth 2 (two) times daily with a meal. 10/06/20   Cassandra Mclean, MD  pantoprazole (PROTONIX) 20 MG tablet Take 1 tablet (20 mg total) by mouth daily. 08/18/20   Cassandra Berkshire, MD  predniSONE (DELTASONE) 20 MG tablet Take 1 tablet (20 mg total) by mouth 2 (two) times daily. 10/01/20   Cassandra Bale, MD  traMADol (ULTRAM) 50 MG tablet Take 1 tablet (50 mg total) by mouth every 6 (six) hours as needed for moderate pain or severe pain. 10/01/20   Cassandra Bale, MD    Allergies    Patient has no known allergies.  Review of Systems   Review of Systems  Constitutional:  Negative for fever.  Gastrointestinal:  Positive for constipation. Negative for abdominal pain and bowel incontinence.  Genitourinary:  Negative for bladder incontinence.  Musculoskeletal:  Positive for back pain.  Neurological:  Negative for weakness.  All other systems reviewed and are negative.  Physical Exam Updated Vital  Signs BP (!) 157/99 (BP Location: Left Arm)   Pulse 88   Temp 98 F (36.7 C) (Oral)   Resp 17   Ht 1.448 m (4\' 9" )   Wt 64.4 kg   SpO2 99%   BMI 30.73 kg/m   Physical Exam CONSTITUTIONAL: Elderly, uncomfortable appearing HEAD: Normocephalic/atraumatic EYES: EOMI/PERRL ENMT: Mucous membranes moist NECK: supple no meningeal signs SPINE/BACK: No thoracic tenderness.  There is lumbar spinal and paraspinal tenderness.  No bruising/crepitance/stepoffs noted to spine CV: S1/S2 noted, no murmurs/rubs/gallops noted LUNGS: Lungs are clear to auscultation bilaterally, no apparent distress ABDOMEN: soft, nontender, no rebound or guarding GU:no cva tenderness NEURO: Awake/alert, equal motor 5/5 strength noted with the following: hip flexion/knee flexion/extension, foot dorsi/plantar flexion, great toe extension intact bilaterally, no clonus bilaterally, plantar reflex appropriate (toes downgoing), no sensory deficit in any dermatome.  Equal patellar/achilles reflex noted  in bilateral lower extremities.  Pt ambulates with antalgic gait EXTREMITIES: pulses normal, full ROM SKIN: warm, color normal PSYCH: Anxious ED Results / Procedures / Treatments   Labs (all labs ordered are listed, but only abnormal results are displayed) Labs Reviewed - No data to display  EKG None  Radiology No results found.  Procedures Procedures   Medications Ordered in ED Medications  HYDROmorphone (DILAUDID) injection 2 mg (2 mg Intramuscular Given 10/18/20 0600)    ED Course  I have reviewed the triage vital signs and the nursing notes.    MDM Rules/Calculators/A&P                           Patient presents with continued back pain.  She had an MRI in November 2021 that revealed spinal stenosis.  She had CT abdomen pelvis in June 2022 that did not reveal any new bony abnormalities. Without any recent trauma or acute neurologic symptoms, no indication for emergent spinal imaging Patient is already  scheduled to see her orthopedist in 2 days.  She would likely require epidural and may not need surgical consultation. There is no indication for acute  intervention at this time.  Patient reports improved  Patient reports she never heard anything about PT consultation  Final Clinical Impression(s) / ED Diagnoses Final diagnoses:  Lumbosacral radiculopathy    Rx / DC Orders ED Discharge Orders     None        Zadie Rhine, MD 10/18/20 [phone]

## 2020-10-19 ENCOUNTER — Telehealth: Payer: Self-pay | Admitting: Orthopaedic Surgery

## 2020-10-19 NOTE — Telephone Encounter (Signed)
Patient called and stated the medication she was prescribed isn't helping at all. She had to go to the ED due to pain. Patient would like something else called in.  Pharmacy [facility]

## 2020-10-20 ENCOUNTER — Ambulatory Visit (INDEPENDENT_AMBULATORY_CARE_PROVIDER_SITE_OTHER): Payer: Medicare Other | Admitting: Orthopaedic Surgery

## 2020-10-20 ENCOUNTER — Other Ambulatory Visit: Payer: Self-pay

## 2020-10-20 ENCOUNTER — Encounter: Payer: Self-pay | Admitting: Orthopaedic Surgery

## 2020-10-20 VITALS — BP 162/98 | HR 81 | Ht <= 58 in | Wt 144.6 lb

## 2020-10-20 DIAGNOSIS — M79605 Pain in left leg: Secondary | ICD-10-CM | POA: Diagnosis not present

## 2020-10-20 DIAGNOSIS — M545 Pain in right leg: Secondary | ICD-10-CM

## 2020-10-20 DIAGNOSIS — G8929 Other chronic pain: Secondary | ICD-10-CM | POA: Diagnosis not present

## 2020-10-20 DIAGNOSIS — M79604 Pain in right leg: Secondary | ICD-10-CM

## 2020-10-20 DIAGNOSIS — M5441 Lumbago with sciatica, right side: Secondary | ICD-10-CM | POA: Diagnosis not present

## 2020-10-20 MED ORDER — HYDROCODONE-ACETAMINOPHEN 7.5-325 MG PO TABS: 1.0000 | ORAL_TABLET | Freq: Four times a day (QID) | ORAL | 0 refills | Status: AC | PRN

## 2020-10-20 MED ORDER — PREDNISONE 10 MG (21) PO TBPK: ORAL_TABLET | ORAL | 1 refills | Status: DC

## 2020-10-20 NOTE — Progress Notes (Signed)
My back is worse.  She has more lower back pain.  She went to the ER yesterday.  She has not seen Dr. Alvester Morin. She was to call him and get appointment to be reseen and possible injection epidural again.  She said she never got a number to call.  I have read the ER notes from yesterday.  She has no new trauma, no weakness.  Spine/Pelvis examination:  Inspection:  Overall, sacoiliac joint benign and hips nontender; without crepitus or defects.   Thoracic spine inspection: Alignment normal without kyphosis present   Lumbar spine inspection:  Alignment  with normal lumbar lordosis, without scoliosis apparent.   Thoracic spine palpation:  without tenderness of spinal processes   Lumbar spine palpation: without tenderness of lumbar area; without tightness of lumbar muscles    Range of Motion:   Lumbar flexion, forward flexion is normal without pain or tenderness    Lumbar extension is full without pain or tenderness   Left lateral bend is normal without pain or tenderness   Right lateral bend is normal without pain or tenderness   Straight leg raising is normal  Strength & tone: normal   Stability overall normal stability  Encounter Diagnoses  Name Primary?   Lumbar pain with radiation down both legs Yes   Chronic bilateral low back pain with right-sided sciatica    I have reviewed the [place] Controlled Substance Reporting System web site prior to prescribing narcotic medicine for this patient.  I will give prednisone dose pack.  Return in two weeks.  See Dr. Alvester Morin.  Call made for appointment.  Call if any problem.  Precautions discussed.  Electronically Signed Darreld Mclean, MD 9/1/202210:04 AM

## 2020-10-25 NOTE — Telephone Encounter (Signed)
Patient called this morning, 10/25/20, 9:20am, and left a voice message to please call her. I called her back as soon as possible, 10:35am, and was unable to reach or leave a message - no voice mail is set up.

## 2020-10-26 ENCOUNTER — Other Ambulatory Visit: Payer: Self-pay

## 2020-10-26 ENCOUNTER — Ambulatory Visit (INDEPENDENT_AMBULATORY_CARE_PROVIDER_SITE_OTHER): Payer: Medicare Other | Admitting: Physical Medicine and Rehabilitation

## 2020-10-26 ENCOUNTER — Encounter: Payer: Self-pay | Admitting: Physical Medicine and Rehabilitation

## 2020-10-26 ENCOUNTER — Ambulatory Visit: Payer: Self-pay

## 2020-10-26 VITALS — BP 177/69 | HR 88

## 2020-10-26 DIAGNOSIS — M48062 Spinal stenosis, lumbar region with neurogenic claudication: Secondary | ICD-10-CM | POA: Diagnosis not present

## 2020-10-26 DIAGNOSIS — M5416 Radiculopathy, lumbar region: Secondary | ICD-10-CM

## 2020-10-26 MED ORDER — METHYLPREDNISOLONE ACETATE 80 MG/ML IJ SUSP
80.0000 mg | Freq: Once | INTRAMUSCULAR | Status: AC
  Administered 2020-10-26: 80 mg

## 2020-10-26 NOTE — Progress Notes (Addendum)
Cassandra Montoya - 65 y.o. female MRN [id number]  Date of birth: Jul 29, 1955  Office Visit Note: Visit Date: 10/26/2020 PCP: Altamease Oiler, FNP Referred by: Altamease Oiler, FNP  Subjective: Chief Complaint  Patient presents with   Lower Back - Pain   Left Leg - Pain   Right Leg - Pain   HPI:  Cassandra Montoya is a 65 y.o. female who comes in today At the request of Dr. Darreld Mclean for repeat epidural injection of the lumbar spine.  By way of history we saw the patient in February completed bilateral L4 transforaminal injection at the level of greatest stenosis.  She has MRI findings of pretty severe stenosis at L4-5 without listhesis.  There is by foraminal narrowing as well.  She reported just a few days of relief in February but did not return to see [place].  We give the patient pretty detailed instructions to give the injection a few days to start to work and then after 2 weeks if this is not beneficial enough to let us know.  We do not do a series of 3 injections as that is fallen out of favor and a lot of times is is basically trying a different injection or a different location that actually works.  Nonetheless the patient was lost to follow-up at that point and continued to have severe pain.  She has been in the emergency department on a few occasions.  She has had follow-up recently with Dr. Hilda Lias with increased back pain with bilateral posterior lateral leg pain down both legs and more of an S1 distribution.  She reports no relief with medication management despite opioid treatment and muscle relaxers etc.  We are going to complete an L5-S1 interlaminar epidural steroid injection today.   I am going to have her follow-up in 2 to 3 weeks for possible repeat injection or evaluation just to see how she is doing at that point.  Also she would be a good candidate for surgical decompression at that level.  I did speak to her about that.  She can speak with Dr. Hilda Lias about his opinion on  that as well.  She seems to be in extreme amount of pain today with 8 out of 10 pain and 10 out of 10 dysfunction.  ROS Otherwise per HPI.  Assessment & Plan: Visit Diagnoses:    ICD-10-CM   1. Lumbar radiculopathy  M54.16 XR C-ARM NO REPORT    Epidural Steroid injection    methylPREDNISolone acetate (DEPO-MEDROL) injection 80 mg    2. Spinal stenosis of lumbar region with neurogenic claudication  M48.062 XR C-ARM NO REPORT    Epidural Steroid injection    methylPREDNISolone acetate (DEPO-MEDROL) injection 80 mg      Plan: No additional findings.   Meds & Orders:  Meds ordered this encounter  Medications   methylPREDNISolone acetate (DEPO-MEDROL) injection 80 mg    Orders Placed This Encounter  Procedures   XR C-ARM NO REPORT   Epidural Steroid injection    Follow-up: Return in about 4 weeks (around 11/23/2020).   Procedures: No procedures performed  Lumbar Epidural Steroid Injection - Interlaminar Approach with Fluoroscopic Guidance  Patient: Cassandra Montoya      Date of Birth: 23-Mar-1955 MRN: [id number] PCP: Altamease Oiler, FNP      Visit Date: 10/26/2020   Universal Protocol:     Consent Given By: the patient  Position: PRONE  Additional Comments: Vital signs were monitored before  and after the procedure. Patient was prepped and draped in the usual sterile fashion. The correct patient, procedure, and site was verified.   Injection Procedure Details:   Procedure diagnoses: Lumbar radiculopathy [M54.16]   Meds Administered:  Meds ordered this encounter  Medications   methylPREDNISolone acetate (DEPO-MEDROL) injection 80 mg     Laterality: Right  Location/Site:  L5-S1  Needle: 3.5 in., 20 ga. Tuohy  Needle Placement: Paramedian epidural  Findings:   -Comments: Excellent flow of contrast into the epidural space.  Patient was in significant discomfort just laying on her stomach despite being propped up in the normal position.  We did roll her up  into somewhat of a oblique right decubitus position that seem to alleviate some of the pain.  Interlaminar injection was performed without any difficulty on my part but it elicited significant pain response with her with any type of movement or medication delivery.  Other than pain symptom expression no other problems during the injection.  Procedure Details: Using a paramedian approach from the side mentioned above, the region overlying the inferior lamina was localized under fluoroscopic visualization and the soft tissues overlying this structure were infiltrated with 4 ml. of 1% Lidocaine without Epinephrine. The Tuohy needle was inserted into the epidural space using a paramedian approach.   The epidural space was localized using loss of resistance along with counter oblique bi-planar fluoroscopic views.  After negative aspirate for air, blood, and CSF, a 2 ml. volume of Isovue-250 was injected into the epidural space and the flow of contrast was observed. Radiographs were obtained for documentation purposes.    The injectate was administered into the level noted above.   Additional Comments:  No complications occurred Dressing: 2 x 2 sterile gauze and Band-Aid    Post-procedure details: Patient was observed during the procedure. Post-procedure instructions were reviewed.  Patient left the clinic in stable condition.    Clinical History: MRI LUMBAR SPINE WITHOUT CONTRAST   TECHNIQUE: Multiplanar, multisequence MR imaging of the lumbar spine was performed. No intravenous contrast was administered.   COMPARISON:  03/04/2018 abdomen and pelvis CT   FINDINGS: Segmentation:  5 lumbar type vertebrae   Alignment: Grade 1 anterolisthesis at L3-4 and L4-5. Borderline anterolisthesis at L5-S1.   Vertebrae:  No fracture, evidence of discitis, or bone lesion.   Conus medullaris and cauda equina: Conus extends to the T12-L1 level. Conus and cauda equina appear normal.   Paraspinal and  other soft tissues: Negative   Disc levels:   T12- L1: Small left paracentral protrusion.   L1-L2: Unremarkable.   L2-L3: Mild disc bulging.   L3-L4: Facet spurring and ligamentous thickening with slight anterolisthesis. The disc is bulging with mild to moderate spinal stenosis.   L4-L5: Facet osteoarthritis with spurring and joint distortion. Anterolisthesis. The disc is bulging and there is high-grade spinal stenosis. Mild bilateral foraminal narrowing   L5-S1:Facet osteoarthritis with spurring on the right more than left. The disc is narrowed and bulging with a downward pointing herniation deforming the ventral thecal sac and impinging on the left subarticular recess. Noncompressive foraminal narrowing on the right more than left.   IMPRESSION: 1. Disc and facet degeneration especially at L3-4 and below where there is mild listheses. 2. Spinal stenosis that is advanced at L4-5 and mild-to-moderate at L3-4. 3. L5-S1 asymmetric left subarticular recess effacement.     Electronically Signed   By: Marnee Spring M.D.   On: 01/07/2020 04:46     Objective:  VS:  HT:    WT:   BMI:     BP:(!) 177/69  HR:88bpm  TEMP: ( )  RESP:  Physical Exam Vitals and nursing note reviewed.  Constitutional:      General: She is not in acute distress.    Appearance: Normal appearance. She is not ill-appearing.  HENT:     Head: Normocephalic and atraumatic.     Right Ear: External ear normal.     Left Ear: External ear normal.  Eyes:     Extraocular Movements: Extraocular movements intact.  Cardiovascular:     Rate and Rhythm: Normal rate.     Pulses: Normal pulses.  Pulmonary:     Effort: Pulmonary effort is normal. No respiratory distress.  Abdominal:     General: There is no distension.     Palpations: Abdomen is soft.  Musculoskeletal:        General: Tenderness present.     Cervical back: Neck supple.     Right lower leg: No edema.     Left lower leg: No edema.      Comments: Patient has good distal strength with no pain over the greater trochanters.  No clonus or focal weakness.  Skin:    Findings: No erythema, lesion or rash.  Neurological:     General: No focal deficit present.     Mental Status: She is alert and oriented to person, place, and time.     Sensory: No sensory deficit.     Motor: No weakness or abnormal muscle tone.     Coordination: Coordination normal.     Gait: Gait abnormal.  Psychiatric:        Mood and Affect: Mood normal.        Behavior: Behavior normal.     Imaging: No results found.

## 2020-10-26 NOTE — Procedures (Signed)
Lumbar Epidural Steroid Injection - Interlaminar Approach with Fluoroscopic Guidance  Patient: Cassandra Montoya      Date of Birth: 1955/06/24 MRN: [id number] PCP: Altamease Oiler, FNP      Visit Date: 10/26/2020   Universal Protocol:     Consent Given By: the patient  Position: PRONE  Additional Comments: Vital signs were monitored before and after the procedure. Patient was prepped and draped in the usual sterile fashion. The correct patient, procedure, and site was verified.   Injection Procedure Details:   Procedure diagnoses: Lumbar radiculopathy [M54.16]   Meds Administered:  Meds ordered this encounter  Medications   methylPREDNISolone acetate (DEPO-MEDROL) injection 80 mg     Laterality: Right  Location/Site:  L5-S1  Needle: 3.5 in., 20 ga. Tuohy  Needle Placement: Paramedian epidural  Findings:   -Comments: Excellent flow of contrast into the epidural space.  Patient was in significant discomfort just laying on her stomach despite being propped up in the normal position.  We did roll her up into somewhat of a oblique right decubitus position that seem to alleviate some of the pain.  Interlaminar injection was performed without any difficulty on my part but it elicited significant pain response with her with any type of movement or medication delivery.  Other than pain symptom expression no other problems during the injection.  Procedure Details: Using a paramedian approach from the side mentioned above, the region overlying the inferior lamina was localized under fluoroscopic visualization and the soft tissues overlying this structure were infiltrated with 4 ml. of 1% Lidocaine without Epinephrine. The Tuohy needle was inserted into the epidural space using a paramedian approach.   The epidural space was localized using loss of resistance along with counter oblique bi-planar fluoroscopic views.  After negative aspirate for air, blood, and CSF, a 2 ml. volume of  Isovue-250 was injected into the epidural space and the flow of contrast was observed. Radiographs were obtained for documentation purposes.    The injectate was administered into the level noted above.   Additional Comments:  No complications occurred Dressing: 2 x 2 sterile gauze and Band-Aid    Post-procedure details: Patient was observed during the procedure. Post-procedure instructions were reviewed.  Patient left the clinic in stable condition.

## 2020-10-26 NOTE — Progress Notes (Signed)
Pt state lower back pain that travels down both posterior legs. Pt state wallking, standing and sitting makes the pain worse. Pt state she takes pain meds to help ease her pain.  Pt has hx of inj on 04/04/20 pt state  Numeric Pain Rating Scale and Functional Assessment Average Pain 8   In the last MONTH (on 0-10 scale) has pain interfered with the following?  1. General activity like being  able to carry out your everyday physical activities such as walking, climbing stairs, carrying groceries, or moving a chair?  Rating(10)   +Driver, -BT, -Dye Allergies.

## 2020-10-26 NOTE — Patient Instructions (Signed)

## 2020-10-31 ENCOUNTER — Telehealth: Payer: Self-pay

## 2020-10-31 MED ORDER — HYDROCODONE-ACETAMINOPHEN 5-325 MG PO TABS: 1.0000 | ORAL_TABLET | Freq: Four times a day (QID) | ORAL | 0 refills | Status: AC | PRN

## 2020-10-31 NOTE — Telephone Encounter (Signed)
Patient requesting prescription for Hydrocodone   PATIENT USES [facility] ON [address]

## 2020-11-03 ENCOUNTER — Ambulatory Visit (INDEPENDENT_AMBULATORY_CARE_PROVIDER_SITE_OTHER): Payer: Medicare Other | Admitting: Orthopaedic Surgery

## 2020-11-03 ENCOUNTER — Other Ambulatory Visit: Payer: Self-pay

## 2020-11-03 ENCOUNTER — Encounter: Payer: Self-pay | Admitting: Orthopaedic Surgery

## 2020-11-03 VITALS — BP 132/71 | HR 82 | Ht <= 58 in | Wt 145.4 lb

## 2020-11-03 DIAGNOSIS — M545 Low back pain, unspecified: Secondary | ICD-10-CM

## 2020-11-03 DIAGNOSIS — M79604 Pain in right leg: Secondary | ICD-10-CM

## 2020-11-03 DIAGNOSIS — M79605 Pain in left leg: Secondary | ICD-10-CM | POA: Diagnosis not present

## 2020-11-03 NOTE — Progress Notes (Signed)
My back is still hurting bad.  She had the epidural last week from Dr. Alvester Morin. She did not get much relief.  She is scheduled to return there on 9-28 for another injection.  The pain medicine is not helping much.  She has no new trauma.  Spine/Pelvis examination:  Inspection:  Overall, sacoiliac joint benign and hips nontender; without crepitus or defects.   Thoracic spine inspection: Alignment normal without kyphosis present   Lumbar spine inspection:  Alignment  with normal lumbar lordosis, without scoliosis apparent.   Thoracic spine palpation:  without tenderness of spinal processes   Lumbar spine palpation: without tenderness of lumbar area; without tightness of lumbar muscles    Range of Motion:   Lumbar flexion, forward flexion is normal without pain or tenderness    Lumbar extension is full without pain or tenderness   Left lateral bend is normal without pain or tenderness   Right lateral bend is normal without pain or tenderness   Straight leg raising is normal  Strength & tone: normal   Stability overall normal stability  Encounter Diagnosis  Name Primary?   Lumbar pain with radiation down both legs Yes   Continue the pain medicine.  I can increase dose next time for refill if needed.  Get the next epidural.  Return in three weeks.  Call if any problem.  Precautions discussed.  Electronically Signed Darreld Mclean, MD 9/15/202210:24 AM  .

## 2020-11-09 ENCOUNTER — Telehealth: Payer: Self-pay | Admitting: Radiology

## 2020-11-09 NOTE — Telephone Encounter (Signed)
Patient called, said she is in so much pain, could not go to work today.  She needs a work note for today please.  She also said that the pain medication is not strong enough to help her pain and is asking for a pain medication that will. Can you advise on the note and meds? Thanks.

## 2020-11-10 ENCOUNTER — Encounter: Payer: Self-pay | Admitting: Orthopaedic Surgery

## 2020-11-10 ENCOUNTER — Other Ambulatory Visit: Payer: Self-pay

## 2020-11-10 ENCOUNTER — Emergency Department (HOSPITAL_COMMUNITY)
Admission: EM | Admit: 2020-11-10 | Discharge: 2020-11-10 | Disposition: A | Discharge status: Home / Self Care | Payer: Medicare Other | Attending: Emergency Medicine | Admitting: Emergency Medicine

## 2020-11-10 ENCOUNTER — Encounter (HOSPITAL_COMMUNITY): Payer: Self-pay | Admitting: Emergency Medicine

## 2020-11-10 DIAGNOSIS — Z7982 Long term (current) use of aspirin: Secondary | ICD-10-CM | POA: Diagnosis not present

## 2020-11-10 DIAGNOSIS — M5441 Lumbago with sciatica, right side: Secondary | ICD-10-CM | POA: Diagnosis not present

## 2020-11-10 DIAGNOSIS — Z79899 Other long term (current) drug therapy: Secondary | ICD-10-CM | POA: Insufficient documentation

## 2020-11-10 DIAGNOSIS — J449 Chronic obstructive pulmonary disease, unspecified: Secondary | ICD-10-CM | POA: Insufficient documentation

## 2020-11-10 DIAGNOSIS — J45909 Unspecified asthma, uncomplicated: Secondary | ICD-10-CM | POA: Insufficient documentation

## 2020-11-10 DIAGNOSIS — K5900 Constipation, unspecified: Secondary | ICD-10-CM | POA: Insufficient documentation

## 2020-11-10 DIAGNOSIS — F17210 Nicotine dependence, cigarettes, uncomplicated: Secondary | ICD-10-CM | POA: Diagnosis not present

## 2020-11-10 DIAGNOSIS — G8929 Other chronic pain: Secondary | ICD-10-CM

## 2020-11-10 DIAGNOSIS — M545 Low back pain, unspecified: Secondary | ICD-10-CM | POA: Diagnosis present

## 2020-11-10 DIAGNOSIS — I10 Essential (primary) hypertension: Secondary | ICD-10-CM | POA: Insufficient documentation

## 2020-11-10 HISTORY — DX: Essential (primary) hypertension: I10

## 2020-11-10 MED ORDER — HYDROCODONE-ACETAMINOPHEN 5-325 MG PO TABS: 1.0000 | ORAL_TABLET | ORAL | 0 refills | Status: DC | PRN

## 2020-11-10 MED ORDER — METHOCARBAMOL 500 MG PO TABS
500.0000 mg | ORAL_TABLET | Freq: Once | ORAL | Status: AC
  Administered 2020-11-10: 500 mg via ORAL
  Filled 2020-11-10: qty 1

## 2020-11-10 MED ORDER — HYDROCODONE-ACETAMINOPHEN 5-325 MG PO TABS
1.0000 | ORAL_TABLET | Freq: Once | ORAL | Status: AC
  Administered 2020-11-10: 1 via ORAL
  Filled 2020-11-10: qty 1

## 2020-11-10 MED ORDER — MAGNESIUM HYDROXIDE 400 MG/5ML PO SUSP: 15.0000 mL | Freq: Once | ORAL | Status: DC

## 2020-11-10 MED ORDER — IBUPROFEN 800 MG PO TABS
800.0000 mg | ORAL_TABLET | Freq: Once | ORAL | Status: AC
  Administered 2020-11-10: 800 mg via ORAL

## 2020-11-10 MED ORDER — MAGNESIUM HYDROXIDE 400 MG/5ML PO SUSP
15.0000 mL | Freq: Once | ORAL | Status: AC
  Administered 2020-11-10: 15 mL via ORAL
  Filled 2020-11-10: qty 30

## 2020-11-10 MED ORDER — LIDOCAINE 5 % EX PTCH
1.0000 | MEDICATED_PATCH | CUTANEOUS | Status: DC
  Administered 2020-11-10: 1 via TRANSDERMAL
  Filled 2020-11-10: qty 1

## 2020-11-10 MED ORDER — METHOCARBAMOL 500 MG PO TABS: 500.0000 mg | ORAL_TABLET | Freq: Two times a day (BID) | ORAL | 0 refills | Status: DC | PRN

## 2020-11-10 MED ORDER — MAGNESIUM CITRATE PO SOLN: 1.0000 | Freq: Once | ORAL | 0 refills | Status: DC

## 2020-11-10 MED ORDER — POLYETHYLENE GLYCOL 3350 17 GM/SCOOP PO POWD: 1.0000 | Freq: Once | ORAL | 0 refills | Status: AC

## 2020-11-10 MED ORDER — LIDOCAINE 5 % EX PTCH: 1.0000 | MEDICATED_PATCH | CUTANEOUS | 0 refills | Status: DC

## 2020-11-10 NOTE — ED Triage Notes (Signed)
Pt arrives via [facility] d/t lower back pain that radiates down both legs, but mostly in right leg. Hx of sciatica problems and  has f/u appt next week. Progressively gotten worse over past few weeks. Took prescription pain pills prior to coming in today but doesn't know what they are.

## 2020-11-10 NOTE — Telephone Encounter (Signed)
I called patient and advised, note written for [facility] yesterday and today.  Patient will come get printed copy tomorrow.  Advised also no change in pain meds, she voiced understanding.

## 2020-11-10 NOTE — ED Provider Notes (Signed)
[facility] EMERGENCY DEPARTMENT Provider Note   CSN: [id number] Arrival date & time: 11/10/20  5009     History Chief Complaint  Patient presents with   Back Pain    Cassandra Montoya is a 65 y.o. female.  This is a 65 y.o. female with significant medical history as below, including sciatica, chronic back pain, COPD who presents to the ED with complaint of back pain. Pain is localized to right lumbar region, sciatica down right leg. Described as sharp, stabbing, aching. Constant but worse with exertion/torso twisting/ambulation. Improved with rest. Mild in severity. She was previously taking norco but ran out a couple days ago. Follows with ortho and receives epidural injections periodically. MRI last 12 mos. No IVDU, no fevers or chills. No saddle paresthesias or neurologic changes. No urinary incontinence/overflow, no bowel incontinence. She does report intermittent constipation that is chronic. She took senna x2 2-3 days ago which did not relieve symptoms. She has had a couple small bowel movements the last few days but feels like she has not had a full bowel movement in a 3-4 days. She says motrin/tylenol do not work but she has not tried taking these medications. Also says muscle relaxer medications do not work for her pain but again has not tried taking these for current episode of pain. She says the only thing that works is Norco.   No N/V/D, she is tolerating PO intake without difficulty.    The history is provided by the patient. No language interpreter was used.  Back Pain Location:  Lumbar spine Quality:  Stabbing Radiates to:  R posterior upper leg Pain severity:  Mild Pain is:  Unable to specify Onset quality:  Gradual Duration:  12 months Timing:  Intermittent Progression:  Waxing and waning Chronicity:  Chronic Relieved by:  Narcotics Worsened by:  Bending and twisting Ineffective treatments:  Lying down and being still Associated symptoms: no abdominal pain, no  abdominal swelling, no bladder incontinence, no bowel incontinence, no chest pain, no dysuria, no fever, no headaches, no numbness and no perianal numbness   Risk factors: no hx of cancer and no hx of osteoporosis       Past Medical History:  Diagnosis Date   Asthma    COPD (chronic obstructive pulmonary disease) (HCC)    Hypertension    Ovarian cyst    Right ovarian cyst,    Pneumonia     Patient Active Problem List   Diagnosis Date Noted   Gastric ulcer 05/26/2018   Abdominal pain, epigastric 01/30/2018   Abnormal weight loss 01/30/2018   Dysphagia 01/30/2018   Loss of appetite 01/30/2018   Nonpulsatile abdominal mass 01/30/2018   Influenza A 02/14/2017   Hypokalemia 02/14/2017   Hyperglycemia, drug-induced 08/13/2015   COPD with acute exacerbation (HCC) 08/12/2015   Acute respiratory distress 08/12/2015   Tobacco abuse 05/25/2014    Past Surgical History:  Procedure Laterality Date   BIOPSY  03/26/2018   Procedure: BIOPSY;  Surgeon: Corbin Ade, MD;  Location: [facility];  Service: Endoscopy;;  gastric   BIOPSY  08/13/2018   Procedure: BIOPSY;  Surgeon: Corbin Ade, MD;  Location: [facility];  Service: Endoscopy;;  gastric   COLONOSCOPY N/A 03/26/2018   Dr. 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  10-year screening colonoscopy   ESOPHAGOGASTRODUODENOSCOPY N/A 03/26/2018   Dr. Lovena Neighbours: Esophagitis, mild Schatzki ring, gastric ulcer with multiple satellite erosions.  Esophagus was dilated.  Gastric biopsies were benign.  No H. pylori.  Recommend 47-month surveillance EGD.   ESOPHAGOGASTRODUODENOSCOPY N/A 08/13/2018   Procedure: ESOPHAGOGASTRODUODENOSCOPY (EGD);  Surgeon: Corbin Ade, MD;  Location: [facility];  Service: Endoscopy;  Laterality: N/A;  12:15pm   MALONEY DILATION N/A 03/26/2018   Procedure: Elease Hashimoto DILATION;  Surgeon: Corbin Ade, MD;  Location: [facility];  Service: Endoscopy;  Laterality: N/A;   MALONEY DILATION N/A 08/13/2018    Procedure: Elease Hashimoto DILATION;  Surgeon: Corbin Ade, MD;  Location: [facility];  Service: Endoscopy;  Laterality: N/A;     OB History   No obstetric history on file.     Family History  Problem Relation Age of Onset   Stroke Brother    Hypertension Mother    Colon cancer Neg Hx     Social History   Tobacco Use   Smoking status: Every Day    Packs/day: 0.50    Types: Cigarettes   Smokeless tobacco: Never  Vaping Use   Vaping Use: Never used  Substance Use Topics   Alcohol use: No   Drug use: No    Home Medications Prior to Admission medications   Medication Sig Start Date End Date Taking? Authorizing Provider  albuterol (PROVENTIL HFA;VENTOLIN HFA) 108 (90 Base) MCG/ACT inhaler Inhale 1-2 puffs into the lungs every 6 (six) hours as needed for wheezing or shortness of breath.   Yes [provider]  albuterol (PROVENTIL) (2.5 MG/3ML) 0.083% nebulizer solution Take 3 mLs (2.5 mg total) by nebulization every 6 (six) hours as needed for wheezing or shortness of breath. 07/24/20  Yes Triplett, Tammy, PA-C  aspirin EC 81 MG tablet Take 81 mg by mouth daily.   Yes [provider]  Aspirin-Salicylamide-Caffeine (BC HEADACHE POWDER PO) Take 1 packet by mouth daily as needed (pain).   Yes [provider]  HYDROcodone-acetaminophen (NORCO/VICODIN) 5-325 MG tablet Take 1 tablet by mouth every 4 (four) hours as needed. 11/10/20  Yes Tanda Rockers A, DO  lidocaine (LIDODERM) 5 % Place 1 patch onto the skin daily. Remove & Discard patch within 12 hours or as directed by MD 11/10/20  Yes Tanda Rockers A, DO  lisinopril (ZESTRIL) 20 MG tablet Take 1 tablet (20 mg total) by mouth daily. 10/01/20  Yes Mancel Bale, MD  methocarbamol (ROBAXIN) 500 MG tablet Take 1 tablet (500 mg total) by mouth 2 (two) times daily as needed for muscle spasms. 11/10/20  Yes Tanda Rockers A, DO  naproxen (NAPROSYN) 500 MG tablet Take 1 tablet (500 mg total) by mouth 2 (two) times daily with  a meal. 10/06/20  Yes Darreld Mclean, MD  polyethylene glycol powder (MIRALAX) 17 GM/SCOOP powder Take 255 g by mouth once for 1 dose. Take one scoop daily (17gm). Stop if you develop diarrhea. 11/10/20 11/10/20 Yes Sloan Leiter, DO  escitalopram (LEXAPRO) 5 MG tablet  10/20/19   [provider]  feeding supplement, ENSURE ENLIVE, (ENSURE ENLIVE) LIQD Take 237 mLs by mouth 2 (two) times daily between meals. Patient not taking: No sig reported 02/17/17   Kathlen Mody, MD  Fluticasone-Umeclidin-Vilant 100-62.5-25 MCG/INH AEPB Inhale 1 puff into the lungs daily. Patient not taking: No sig reported    [provider]  gabapentin (NEURONTIN) 300 MG capsule Take 300 mg by mouth daily. Patient not taking: No sig reported    [provider]  hydrochlorothiazide (HYDRODIURIL) 25 MG tablet Take 25 mg by mouth daily. Patient not taking: Reported on 11/10/2020 11/24/19   [provider]  pantoprazole (PROTONIX) 20 MG tablet Take 1 tablet (20 mg total) by mouth daily. Patient not taking: No sig reported 08/18/20   Bethann Berkshire, MD    Allergies    Patient has no known allergies.  Review of Systems   Review of Systems  Constitutional:  Negative for chills and fever.  HENT:  Negative for facial swelling and trouble swallowing.   Eyes:  Negative for photophobia and visual disturbance.  Respiratory:  Negative for cough and shortness of breath.   Cardiovascular:  Negative for chest pain and palpitations.  Gastrointestinal:  Positive for constipation. Negative for abdominal pain, bowel incontinence, nausea and vomiting.  Endocrine: Negative for polydipsia and polyuria.  Genitourinary:  Negative for bladder incontinence, difficulty urinating, dysuria and hematuria.  Musculoskeletal:  Positive for back pain. Negative for gait problem and joint swelling.  Skin:  Negative for pallor and rash.  Neurological:  Negative for syncope, numbness and headaches.   Psychiatric/Behavioral:  Negative for agitation and confusion.    Physical Exam Updated Vital Signs BP (!) 162/88   Pulse 77   Temp 97.9 F (36.6 C) (Oral)   Resp 18   Ht 4\' 9"  (1.448 m)   Wt 67.1 kg   SpO2 100%   BMI 32.03 kg/m   Physical Exam Vitals and nursing note reviewed.  Constitutional:      General: She is not in acute distress.    Appearance: Normal appearance.  HENT:     Head: Normocephalic and atraumatic.     Right Ear: External ear normal.     Left Ear: External ear normal.     Nose: Nose normal.     Mouth/Throat:     Mouth: Mucous membranes are moist.  Eyes:     General: No scleral icterus.       Right eye: No discharge.        Left eye: No discharge.     Extraocular Movements: Extraocular movements intact.     Pupils: Pupils are equal, round, and reactive to light.  Cardiovascular:     Rate and Rhythm: Normal rate and regular rhythm.     Pulses: Normal pulses.     Heart sounds: Normal heart sounds.  Pulmonary:     Effort: Pulmonary effort is normal. No respiratory distress.     Breath sounds: Normal breath sounds.  Abdominal:     General: Abdomen is flat.     Tenderness: There is no abdominal tenderness.  Musculoskeletal:        General: Normal range of motion.       Arms:     Cervical back: Normal range of motion.     Right lower leg: No edema.     Left lower leg: No edema.     Comments: No cellulitic changes overlying spinal column. No pain to midline on palpation or percussion, no step off or crepitus on palpation to midline.   Skin:    General: Skin is warm and dry.     Capillary Refill: Capillary refill takes less than 2 seconds.  Neurological:     Mental Status: She is alert and oriented to person, place, and time.     GCS: GCS eye subscore is 4. GCS verbal subscore is 5. GCS motor subscore is 6.     Cranial Nerves: Cranial nerves are intact. No dysarthria.     Sensory: Sensation is intact.     Motor: Motor function is intact.      Coordination: Coordination is intact.  Psychiatric:        Mood and Affect: Mood normal.        Behavior: Behavior normal.    ED Results / Procedures / Treatments   Labs (all labs ordered are listed, but only abnormal results are displayed) Labs Reviewed - No data to display  EKG None  Radiology No results found.  Procedures Procedures   Medications Ordered in ED Medications  lidocaine (LIDODERM) 5 % 1 patch (1 patch Transdermal Patch Applied 11/10/20 0942)  magnesium hydroxide (MILK OF MAGNESIA) suspension 15 mL (has no administration in time range)  HYDROcodone-acetaminophen (NORCO/VICODIN) 5-325 MG per tablet 1 tablet (1 tablet Oral Given 11/10/20 0940)  methocarbamol (ROBAXIN) tablet 500 mg (500 mg Oral Given 11/10/20 0940)  ibuprofen (ADVIL) tablet 800 mg (800 mg Oral Given 11/10/20 0940)    ED Course  I have reviewed the triage vital signs and the nursing notes.  Pertinent labs & imaging results that were available during my care of the patient were reviewed by me and considered in my medical decision making (see chart for details).    MDM Rules/Calculators/A&P                          65 yo female with history and exam as above to ED with back pain, constipation. Back pain consistent with her chronic underlying back pain. No neurologic abnormalities on exam. Pain is not midline (localized to right paraspinal region). No urinary changes, no saddle paresthesias. No fevers or trauma. She has chronic constipation likely a/w opiate use but does not typically use a stool softener regularly. Vital signs reviewed and are stable. Serious etiology considered.  Patient presents with low back pain without signs of spinal cord compression, cauda equina syndrome, infection, aneurysm, or other serious etiology. The patient is neurologically intact. Given the extremely low risk of these diagnoses further testing and evaluation for these possibilities does not appear to be indicated at this  time.    Pt reports symptoms greatly improved after intervention. She is ambulatory and able to tolerate PO. Repeat exam is improved. No neuro deficits appreciated.   Discussed multimodal pain control with patient. Only using opiate medications for severe pain.   Detailed discussions were had with the patient and/or family and caregivers, regarding current findings, and need for close f/u with PCP or on call doctor. The patient has been instructed to return immediately if the symptoms worsen in any way. Patient verbalized understanding and is in agreement with current care plan. All questions answered prior to discharge.    Final Clinical Impression(s) / ED Diagnoses Final diagnoses:  Chronic right-sided low back pain with right-sided sciatica  Constipation, unspecified constipation type    Rx / DC Orders ED Discharge Orders          Ordered    HYDROcodone-acetaminophen (NORCO/VICODIN) 5-325 MG tablet  Every 4 hours PRN        11/10/20 0739    methocarbamol (ROBAXIN) 500 MG tablet  2 times daily PRN        11/10/20 0739    lidocaine (LIDODERM) 5 %  Every 24 hours        11/10/20 0739    magnesium citrate SOLN   Once,   Status:  Discontinued        11/10/20 0739    polyethylene glycol powder (MIRALAX) 17 GM/SCOOP powder   Once        11/10/20 0739  Tanda Rockers A, DO 11/10/20 1034

## 2020-11-16 ENCOUNTER — Ambulatory Visit (INDEPENDENT_AMBULATORY_CARE_PROVIDER_SITE_OTHER): Payer: Medicare Other | Admitting: Physical Medicine and Rehabilitation

## 2020-11-16 ENCOUNTER — Other Ambulatory Visit: Payer: Self-pay

## 2020-11-16 ENCOUNTER — Encounter: Payer: Self-pay | Admitting: Physical Medicine and Rehabilitation

## 2020-11-16 ENCOUNTER — Ambulatory Visit: Payer: Self-pay

## 2020-11-16 ENCOUNTER — Telehealth: Payer: Self-pay | Admitting: Radiology

## 2020-11-16 VITALS — BP 163/77 | HR 94

## 2020-11-16 DIAGNOSIS — M48062 Spinal stenosis, lumbar region with neurogenic claudication: Secondary | ICD-10-CM

## 2020-11-16 DIAGNOSIS — M5416 Radiculopathy, lumbar region: Secondary | ICD-10-CM | POA: Diagnosis not present

## 2020-11-16 MED ORDER — METHYLPREDNISOLONE ACETATE 80 MG/ML IJ SUSP
80.0000 mg | Freq: Once | INTRAMUSCULAR | Status: AC
  Administered 2020-11-16: 80 mg

## 2020-11-16 NOTE — Progress Notes (Signed)
Pt state lower back pain that travels to both legs, mostly her right side. Pt state walking, standing and laying down makes the pain worse. Pt state she takes pain meds to help ease her pain. Pt has hx of inj on 10/26/20 pt state it didn't help.  Numeric Pain Rating Scale and Functional Assessment Average Pain 7   In the last MONTH (on 0-10 scale) has pain interfered with the following?  1. General activity like being  able to carry out your everyday physical activities such as walking, climbing stairs, carrying groceries, or moving a chair?  Rating(10)   +Driver, -BT, -Dye Allergies.

## 2020-11-16 NOTE — Telephone Encounter (Signed)
Patient had ESI today She would like to have a work note to only work 4 hour shifts until she recovers some, perhaps a couple weeks  Please advise when she can get the work note, if that is okay.

## 2020-11-16 NOTE — Patient Instructions (Signed)

## 2020-11-17 ENCOUNTER — Encounter: Payer: Self-pay | Admitting: Orthopaedic Surgery

## 2020-11-17 NOTE — Telephone Encounter (Signed)
I called NA, no VM setup.

## 2020-11-18 ENCOUNTER — Telehealth: Payer: Self-pay | Admitting: Orthopaedic Surgery

## 2020-11-18 NOTE — Telephone Encounter (Signed)
Patient called and wants a refill on her pain medicine   I advised her someone else filled her HYDROcodone-acetaminophen (NORCO/VICODIN) 5-325 MG tablet on 11/10/20 and she said that was the ER Doctor.    She went to the ER because she was in pain and needed something for pain.  I told her Dr. Hilda Lias asst will call her back.   Dr. Hilda Lias will not be back in the office till next Tuesday.   She said she is out of her medicine and needs something for pain.

## 2020-11-18 NOTE — Telephone Encounter (Signed)
I tried calling patient back and got no answer, voicemail has not been set up. I looked at her medications and you gave her pain medication for 7 days (22 tablets) on 10/31/20. She went to ER on 11/10/20 because she was in a lot of pain and they gave her pain medication.   Hydrocodone-Acetaminophen 5/325 mg  Qty 22 Tablets  PATIENT USES [facility]

## 2020-11-20 NOTE — Progress Notes (Signed)
Cassandra Montoya - 65 y.o. female MRN [id number]  Date of birth: [birth year]  Office Visit Note: Visit Date: 11/16/2020 PCP: Altamease Oiler, FNP Referred by: Altamease Oiler, FNP  Subjective: Chief Complaint  Patient presents with   Lower Back - Pain   Left Leg - Pain   Right Leg - Pain   HPI:  Cassandra Montoya is a 65 y.o. female who comes in today at the request of Dr. Darreld Mclean for planned Bilateral L4-5 Lumbar Transforaminal epidural steroid injection with fluoroscopic guidance.  The patient has failed conservative care including home exercise, medications, time and activity modification.  This injection will be diagnostic and hopefully therapeutic.  Please see requesting physician notes for further details and justification. MRI reviewed with images and spine model.  MRI reviewed in the note below.  Initial injection was a transforaminal injection at level of severe stenosis and diagnostically this helped quite a bit but was not long lasting. Second injection was an epidural injection from an interlaminar approach and did not seem to help much.  Depending on relief after this injection she will need referral for spine surgery consultation and would do well with some type of decompression.   ROS Otherwise per HPI.  Assessment & Plan: Visit Diagnoses:    ICD-10-CM   1. Lumbar radiculopathy  M54.16 XR C-ARM NO REPORT    Epidural Steroid injection    methylPREDNISolone acetate (DEPO-MEDROL) injection 80 mg    2. Spinal stenosis of lumbar region with neurogenic claudication  M48.062 XR C-ARM NO REPORT    Epidural Steroid injection    methylPREDNISolone acetate (DEPO-MEDROL) injection 80 mg      Plan: No additional findings.   Meds & Orders:  Meds ordered this encounter  Medications   methylPREDNISolone acetate (DEPO-MEDROL) injection 80 mg    Orders Placed This Encounter  Procedures   XR C-ARM NO REPORT   Epidural Steroid injection    Follow-up: Return for visit to  requesting physician as needed.   Procedures: No procedures performed  Lumbosacral Transforaminal Epidural Steroid Injection - Sub-Pedicular Approach with Fluoroscopic Guidance  Patient: Cassandra Montoya      Date of Birth: 02-06-1956 MRN: [id number] PCP: Altamease Oiler, FNP      Visit Date: 11/16/2020   Universal Protocol:    Date/Time: 11/16/2020  Consent Given By: the patient  Position: PRONE  Additional Comments: Vital signs were monitored before and after the procedure. Patient was prepped and draped in the usual sterile fashion. The correct patient, procedure, and site was verified.   Injection Procedure Details:   Procedure diagnoses: Lumbar radiculopathy [M54.16]    Meds Administered:  Meds ordered this encounter  Medications   methylPREDNISolone acetate (DEPO-MEDROL) injection 80 mg    Laterality: Bilateral  Location/Site: L4  Needle:5.0 in., 22 ga.  Short bevel or Quincke spinal needle  Needle Placement: Transforaminal  Findings:    -Comments: Excellent flow of contrast along the nerve, nerve root and into the epidural space.  Procedure Details: After squaring off the end-plates to get a true AP view, the C-arm was positioned so that an oblique view of the foramen as noted above was visualized. The target area is just inferior to the "nose of the scotty dog" or sub pedicular. The soft tissues overlying this structure were infiltrated with 2-3 ml. of 1% Lidocaine without Epinephrine.  The spinal needle was inserted toward the target using a "trajectory" view along the fluoroscope beam.  Under AP and  lateral visualization, the needle was advanced so it did not puncture dura and was located close the 6 O'Clock position of the pedical in AP tracterory. Biplanar projections were used to confirm position. Aspiration was confirmed to be negative for CSF and/or blood. A 1-2 ml. volume of Isovue-250 was injected and flow of contrast was noted at each level.  Radiographs were obtained for documentation purposes.   After attaining the desired flow of contrast documented above, a 0.5 to 1.0 ml test dose of 0.25% Marcaine was injected into each respective transforaminal space.  The patient was observed for 90 seconds post injection.  After no sensory deficits were reported, and normal lower extremity motor function was noted,   the above injectate was administered so that equal amounts of the injectate were placed at each foramen (level) into the transforaminal epidural space.   Additional Comments:  The patient tolerated the procedure well Dressing: 2 x 2 sterile gauze and Band-Aid    Post-procedure details: Patient was observed during the procedure. Post-procedure instructions were reviewed.  Patient left the clinic in stable condition.    Clinical History: MRI LUMBAR SPINE WITHOUT CONTRAST   TECHNIQUE: Multiplanar, multisequence MR imaging of the lumbar spine was performed. No intravenous contrast was administered.   COMPARISON:  03/04/2018 abdomen and pelvis CT   FINDINGS: Segmentation:  5 lumbar type vertebrae   Alignment: Grade 1 anterolisthesis at L3-4 and L4-5. Borderline anterolisthesis at L5-S1.   Vertebrae:  No fracture, evidence of discitis, or bone lesion.   Conus medullaris and cauda equina: Conus extends to the T12-L1 level. Conus and cauda equina appear normal.   Paraspinal and other soft tissues: Negative   Disc levels:   T12- L1: Small left paracentral protrusion.   L1-L2: Unremarkable.   L2-L3: Mild disc bulging.   L3-L4: Facet spurring and ligamentous thickening with slight anterolisthesis. The disc is bulging with mild to moderate spinal stenosis.   L4-L5: Facet osteoarthritis with spurring and joint distortion. Anterolisthesis. The disc is bulging and there is high-grade spinal stenosis. Mild bilateral foraminal narrowing   L5-S1:Facet osteoarthritis with spurring on the right more than left. The  disc is narrowed and bulging with a downward pointing herniation deforming the ventral thecal sac and impinging on the left subarticular recess. Noncompressive foraminal narrowing on the right more than left.   IMPRESSION: 1. Disc and facet degeneration especially at L3-4 and below where there is mild listheses. 2. Spinal stenosis that is advanced at L4-5 and mild-to-moderate at L3-4. 3. L5-S1 asymmetric left subarticular recess effacement.     Electronically Signed   By: Marnee Spring M.D.   On: 01/07/2020 04:46     Objective:  VS:  HT:    WT:   BMI:     BP:(!) 163/77  HR:94bpm  TEMP: ( )  RESP:  Physical Exam Vitals and nursing note reviewed.  Constitutional:      General: She is not in acute distress.    Appearance: Normal appearance. She is not ill-appearing.  HENT:     Head: Normocephalic and atraumatic.     Right Ear: External ear normal.     Left Ear: External ear normal.  Eyes:     Extraocular Movements: Extraocular movements intact.  Cardiovascular:     Rate and Rhythm: Normal rate.     Pulses: Normal pulses.  Pulmonary:     Effort: Pulmonary effort is normal. No respiratory distress.  Abdominal:     General: There is no distension.  Palpations: Abdomen is soft.  Musculoskeletal:        General: Tenderness present.     Cervical back: Neck supple.     Right lower leg: No edema.     Left lower leg: No edema.     Comments: Patient has good distal strength with no pain over the greater trochanters.  No clonus or focal weakness.  Skin:    Findings: No erythema, lesion or rash.  Neurological:     General: No focal deficit present.     Mental Status: She is alert and oriented to person, place, and time.     Sensory: No sensory deficit.     Motor: No weakness or abnormal muscle tone.     Coordination: Coordination normal.  Psychiatric:        Mood and Affect: Mood normal.        Behavior: Behavior normal.     Imaging: No results found.

## 2020-11-20 NOTE — Procedures (Signed)
Lumbosacral Transforaminal Epidural Steroid Injection - Sub-Pedicular Approach with Fluoroscopic Guidance  Patient: Cassandra Montoya      Date of Birth: [birth year] MRN: [id number] PCP: Altamease Oiler, FNP      Visit Date: 11/16/2020   Universal Protocol:    Date/Time: 11/16/2020  Consent Given By: the patient  Position: PRONE  Additional Comments: Vital signs were monitored before and after the procedure. Patient was prepped and draped in the usual sterile fashion. The correct patient, procedure, and site was verified.   Injection Procedure Details:   Procedure diagnoses: Lumbar radiculopathy [M54.16]    Meds Administered:  Meds ordered this encounter  Medications   methylPREDNISolone acetate (DEPO-MEDROL) injection 80 mg    Laterality: Bilateral  Location/Site: L4  Needle:5.0 in., 22 ga.  Short bevel or Quincke spinal needle  Needle Placement: Transforaminal  Findings:    -Comments: Excellent flow of contrast along the nerve, nerve root and into the epidural space.  Procedure Details: After squaring off the end-plates to get a true AP view, the C-arm was positioned so that an oblique view of the foramen as noted above was visualized. The target area is just inferior to the "nose of the scotty dog" or sub pedicular. The soft tissues overlying this structure were infiltrated with 2-3 ml. of 1% Lidocaine without Epinephrine.  The spinal needle was inserted toward the target using a "trajectory" view along the fluoroscope beam.  Under AP and lateral visualization, the needle was advanced so it did not puncture dura and was located close the 6 O'Clock position of the pedical in AP tracterory. Biplanar projections were used to confirm position. Aspiration was confirmed to be negative for CSF and/or blood. A 1-2 ml. volume of Isovue-250 was injected and flow of contrast was noted at each level. Radiographs were obtained for documentation purposes.   After attaining the  desired flow of contrast documented above, a 0.5 to 1.0 ml test dose of 0.25% Marcaine was injected into each respective transforaminal space.  The patient was observed for 90 seconds post injection.  After no sensory deficits were reported, and normal lower extremity motor function was noted,   the above injectate was administered so that equal amounts of the injectate were placed at each foramen (level) into the transforaminal epidural space.   Additional Comments:  The patient tolerated the procedure well Dressing: 2 x 2 sterile gauze and Band-Aid    Post-procedure details: Patient was observed during the procedure. Post-procedure instructions were reviewed.  Patient left the clinic in stable condition.

## 2020-11-21 MED ORDER — HYDROCODONE-ACETAMINOPHEN 5-325 MG PO TABS: ORAL_TABLET | ORAL | 0 refills | Status: DC

## 2020-11-21 NOTE — Telephone Encounter (Signed)
UPDATE on pharmacy for this medication: [facility] on [address], [place]

## 2020-11-21 NOTE — Addendum Note (Signed)
Addended by: Earnstine Regal on: 11/21/2020 11:15 PM   Modules accepted: Orders

## 2020-11-29 ENCOUNTER — Encounter: Payer: Self-pay | Admitting: Orthopaedic Surgery

## 2020-11-29 ENCOUNTER — Other Ambulatory Visit: Payer: Self-pay

## 2020-11-29 ENCOUNTER — Ambulatory Visit (INDEPENDENT_AMBULATORY_CARE_PROVIDER_SITE_OTHER): Payer: Medicare Other | Admitting: Orthopaedic Surgery

## 2020-11-29 DIAGNOSIS — M79604 Pain in right leg: Secondary | ICD-10-CM

## 2020-11-29 DIAGNOSIS — M79605 Pain in left leg: Secondary | ICD-10-CM

## 2020-11-29 DIAGNOSIS — M545 Low back pain, unspecified: Secondary | ICD-10-CM

## 2020-11-29 MED ORDER — HYDROCODONE-ACETAMINOPHEN 7.5-325 MG PO TABS: ORAL_TABLET | ORAL | 0 refills | Status: DC

## 2020-11-29 NOTE — Patient Instructions (Signed)
Limit to four hours a day

## 2020-11-29 NOTE — Progress Notes (Signed)
I am not any better.  She had the epidural to the lower back on 9-28. She says she is not improved. She has pain of the lower back most all the time.  The cooler weather has not helped. She has no weakness, no new trauma.  She is taking her medicine.  Spine/Pelvis examination:  Inspection:  Overall, sacoiliac joint benign and hips nontender; without crepitus or defects.   Thoracic spine inspection: Alignment normal without kyphosis present   Lumbar spine inspection:  Alignment  with normal lumbar lordosis, without scoliosis apparent.   Thoracic spine palpation:  without tenderness of spinal processes   Lumbar spine palpation: without tenderness of lumbar area; without tightness of lumbar muscles    Range of Motion:   Lumbar flexion, forward flexion is normal without pain or tenderness    Lumbar extension is full without pain or tenderness   Left lateral bend is normal without pain or tenderness   Right lateral bend is normal without pain or tenderness   Straight leg raising is normal  Strength & tone: normal   Stability overall normal stability  Encounter Diagnosis  Name Primary?   Lumbar pain with radiation down both legs Yes   I will change to Norco 7.5  Rx called in.  I have reviewed the [place] Controlled Substance Reporting System web site prior to prescribing narcotic medicine for this patient.  Keep doing the exercises.  Let Dr. Alvester Morin know she did not have a good response to the epidural.  Return in one month.  Call if any problem.  Precautions discussed.  Electronically Signed Darreld Mclean, MD 10/11/20223:26 PM

## 2020-12-06 ENCOUNTER — Telehealth: Payer: Self-pay | Admitting: Physical Medicine and Rehabilitation

## 2020-12-06 NOTE — Telephone Encounter (Signed)
Patient states that the bilateral L4 TF she had on 9/28 did not help to relieve her pain. Please advise.

## 2020-12-07 NOTE — Telephone Encounter (Signed)
Called patient to advise. She will discuss with Dr. Hilda Lias at her appointment on 11/8.

## 2020-12-13 ENCOUNTER — Other Ambulatory Visit: Payer: Self-pay

## 2020-12-13 NOTE — Telephone Encounter (Signed)
Hydrocodone-Acetaminophen  7.5/325 mg  Qty 56 Tablets  One every four hours for pain as needed.  14 day limit  PATIENT USES [facility] ON [address]

## 2020-12-14 ENCOUNTER — Telehealth: Payer: Self-pay | Admitting: Orthopaedic Surgery

## 2020-12-14 MED ORDER — HYDROCODONE-ACETAMINOPHEN 7.5-325 MG PO TABS: ORAL_TABLET | ORAL | 0 refills | Status: DC

## 2020-12-14 NOTE — Telephone Encounter (Signed)
Patient called back again today and states she is in a lot of pain and needs her pain medicine   HYDROcodone-acetaminophen (NORCO) 7.5-325 MG tablet  Pharmacy: [facility] on [address]

## 2020-12-27 ENCOUNTER — Encounter: Payer: Self-pay | Admitting: Orthopaedic Surgery

## 2020-12-27 ENCOUNTER — Other Ambulatory Visit: Payer: Self-pay

## 2020-12-27 ENCOUNTER — Ambulatory Visit (INDEPENDENT_AMBULATORY_CARE_PROVIDER_SITE_OTHER): Payer: Medicare Other | Admitting: Orthopaedic Surgery

## 2020-12-27 ENCOUNTER — Ambulatory Visit: Payer: Medicare Other

## 2020-12-27 VITALS — BP 190/95 | HR 89 | Ht <= 58 in | Wt 138.6 lb

## 2020-12-27 DIAGNOSIS — M79605 Pain in left leg: Secondary | ICD-10-CM | POA: Diagnosis not present

## 2020-12-27 DIAGNOSIS — M545 Low back pain, unspecified: Secondary | ICD-10-CM

## 2020-12-27 DIAGNOSIS — M79604 Pain in right leg: Secondary | ICD-10-CM | POA: Diagnosis not present

## 2020-12-27 MED ORDER — HYDROCODONE-ACETAMINOPHEN 7.5-325 MG PO TABS: ORAL_TABLET | ORAL | 0 refills | Status: DC

## 2020-12-27 MED ORDER — DIAZEPAM 10 MG PO TABS: 10.0000 mg | ORAL_TABLET | Freq: Once | ORAL | 0 refills | Status: AC

## 2020-12-27 NOTE — Progress Notes (Signed)
I am much worse.  She is having more back pain, more bad days.  She has no new trauma. She has pain down both legs, more on the right. She has no new trauma.  The epidurals did not help this time.  She is trying to do exercises she learned in PT but is not able to do them because of the pain.  She is not sleeping well.  She is taking her medicine.  She has pain in the lower back with no spasm. SLR negative.  DTRs normal.  Muscle tone and strength normal but she is in pain.  ROM is limited.  X-rays were done of the lumbar spine, reported separately.  Encounter Diagnosis  Name Primary?   Lumbar pain with radiation down both legs Yes   I will get MRI to rule out new HNP.  I have reviewed the [place] Controlled Substance Reporting System web site prior to prescribing narcotic medicine for this patient.  Return in three weeks.  Call if any problem.  Precautions discussed.  Electronically Signed Darreld Mclean, MD 11/8/202210:49 AM

## 2020-12-29 ENCOUNTER — Telehealth: Payer: Self-pay | Admitting: Orthopaedic Surgery

## 2020-12-29 MED ORDER — HYDROCODONE-ACETAMINOPHEN 7.5-325 MG PO TABS: ORAL_TABLET | ORAL | 0 refills | Status: DC

## 2020-12-29 NOTE — Telephone Encounter (Signed)
Patient called to relay that the Naproxen is not helping. Asking if she may have a refill of the pain medication: HYDROcodone-acetaminophen (NORCO) 7.5-325 MG tablet 56 tablet      If so, uses [facility], [address], [place]    Also, patient said she has been unable to go to work 12/27/20 and 12/28/20; said will try to return today for her 4 hour shift

## 2021-01-04 ENCOUNTER — Telehealth: Payer: Self-pay | Admitting: Orthopaedic Surgery

## 2021-01-04 NOTE — Telephone Encounter (Signed)
Done

## 2021-01-04 NOTE — Telephone Encounter (Signed)
Call received from patient relaying that she is trying to build her work hours back up; states that her employer is working with her with change of hours. Asking for updated note to work 35 hours per week, effective immediately.

## 2021-01-05 ENCOUNTER — Encounter: Payer: Self-pay | Admitting: Orthopaedic Surgery

## 2021-01-05 NOTE — Telephone Encounter (Signed)
Called back to patient; aware to pick up note.

## 2021-01-06 ENCOUNTER — Other Ambulatory Visit: Payer: Self-pay

## 2021-01-06 ENCOUNTER — Ambulatory Visit (HOSPITAL_COMMUNITY)
Admission: RE | Admit: 2021-01-06 | Discharge: 2021-01-06 | Disposition: A | Discharge status: Home / Self Care | Payer: Medicare Other | Source: Ambulatory Visit | Attending: Orthopaedic Surgery | Admitting: Orthopaedic Surgery

## 2021-01-06 DIAGNOSIS — M79605 Pain in left leg: Secondary | ICD-10-CM | POA: Insufficient documentation

## 2021-01-06 DIAGNOSIS — M545 Low back pain, unspecified: Secondary | ICD-10-CM | POA: Diagnosis present

## 2021-01-06 DIAGNOSIS — M79604 Pain in right leg: Secondary | ICD-10-CM | POA: Diagnosis present

## 2021-01-10 ENCOUNTER — Telehealth: Payer: Self-pay | Admitting: Orthopaedic Surgery

## 2021-01-10 NOTE — Telephone Encounter (Signed)
Patient called requesting refill for her pain medicine.    HYDROcodone-acetaminophen (NORCO) 7.5-325 MG tablet   Pharmacy [facility] on [address]

## 2021-01-11 ENCOUNTER — Telehealth: Payer: Self-pay

## 2021-01-11 MED ORDER — HYDROCODONE-ACETAMINOPHEN 7.5-325 MG PO TABS: ORAL_TABLET | ORAL | 0 refills | Status: DC

## 2021-01-11 NOTE — Telephone Encounter (Signed)
Patient called again today asking about if her medicine had been refilled   Pharmacy:  [facility] on [address]

## 2021-01-11 NOTE — Telephone Encounter (Signed)
Hydrocodone-Acetaminophen 7.5/325 mg  PATIENT USES [facility] ON [address]

## 2021-01-16 NOTE — Telephone Encounter (Signed)
Spoke with [place] at the pharmacy and she states that the last fill for the hydrocodone was on 12/29/20. Patient's new fill will be ready after 3 today.

## 2021-01-16 NOTE — Telephone Encounter (Signed)
Called number on chart to advise patient that medication had been sent in on 01/11/21. Someone else had patients phone.

## 2021-01-17 ENCOUNTER — Encounter: Payer: Self-pay | Admitting: Orthopaedic Surgery

## 2021-01-17 ENCOUNTER — Ambulatory Visit: Payer: Medicare Other | Admitting: Orthopaedic Surgery

## 2021-01-17 DIAGNOSIS — F329 Major depressive disorder, single episode, unspecified: Secondary | ICD-10-CM | POA: Insufficient documentation

## 2021-01-17 DIAGNOSIS — G8929 Other chronic pain: Secondary | ICD-10-CM | POA: Insufficient documentation

## 2021-01-17 DIAGNOSIS — F419 Anxiety disorder, unspecified: Secondary | ICD-10-CM | POA: Insufficient documentation

## 2021-01-17 DIAGNOSIS — F411 Generalized anxiety disorder: Secondary | ICD-10-CM | POA: Insufficient documentation

## 2021-01-17 DIAGNOSIS — M5430 Sciatica, unspecified side: Secondary | ICD-10-CM | POA: Insufficient documentation

## 2021-01-17 DIAGNOSIS — R430 Anosmia: Secondary | ICD-10-CM | POA: Insufficient documentation

## 2021-01-17 DIAGNOSIS — I10 Essential (primary) hypertension: Secondary | ICD-10-CM | POA: Insufficient documentation

## 2021-01-17 DIAGNOSIS — R432 Parageusia: Secondary | ICD-10-CM | POA: Insufficient documentation

## 2021-01-17 DIAGNOSIS — F4321 Adjustment disorder with depressed mood: Secondary | ICD-10-CM | POA: Insufficient documentation

## 2021-01-19 ENCOUNTER — Encounter: Payer: Self-pay | Admitting: Orthopaedic Surgery

## 2021-01-19 ENCOUNTER — Other Ambulatory Visit: Payer: Self-pay

## 2021-01-19 ENCOUNTER — Ambulatory Visit (INDEPENDENT_AMBULATORY_CARE_PROVIDER_SITE_OTHER): Payer: Medicare Other | Admitting: Orthopaedic Surgery

## 2021-01-19 DIAGNOSIS — M79605 Pain in left leg: Secondary | ICD-10-CM

## 2021-01-19 DIAGNOSIS — M545 Low back pain, unspecified: Secondary | ICD-10-CM

## 2021-01-19 DIAGNOSIS — M79604 Pain in right leg: Secondary | ICD-10-CM | POA: Diagnosis not present

## 2021-01-19 NOTE — Progress Notes (Signed)
My back hurts.  She had MRI of the lumbar spine showing: IMPRESSION: 1. Multilevel lumbar spondylosis, as outlined above. Findings are slightly progressed at the L4-L5 level where there is severe canal stenosis and moderate bilateral foraminal stenosis. 2. Mild-to-moderate canal stenosis and mild bilateral foraminal stenosis at L3-L4. 3. Moderate right and mild left foraminal stenosis at L5-S1. Unchanged small caudally extending central disc protrusion at this level with slight mass effect upon the bilateral descending S1 nerve roots, slightly more pronounced on the left.  I have explained the findings to her.  She is seeing Dr. Penelope Galas now.  I have copies of his notes.  He is assuming pain medicine care.    I will make sure he has copies of the notes and MRI.  I have independently reviewed the MRI.    She has lower back pain, NV intact, no spasm, ROM limited secondary to pain, normal gait.  Encounter Diagnosis  Name Primary?   Lumbar pain with radiation down both legs Yes   To follow up with Dr. Penelope Galas.  Electronically Signed Darreld Mclean, MD 12/1/202210:38 AM

## 2021-01-24 ENCOUNTER — Telehealth: Payer: Self-pay

## 2021-01-24 NOTE — Telephone Encounter (Signed)
Hydrocodone-Acetaminophen 7.5/325 mg  PATIENT USES [facility] ON [address]

## 2021-01-25 ENCOUNTER — Telehealth: Payer: Self-pay

## 2021-01-25 MED ORDER — HYDROCODONE-ACETAMINOPHEN 7.5-325 MG PO TABS: ORAL_TABLET | ORAL | 0 refills | Status: DC

## 2021-01-25 NOTE — Telephone Encounter (Signed)
Hydrocodone-Acetaminophen 7.5/325 mg  PATIENT USES [facility] ON [address] 

## 2021-02-03 ENCOUNTER — Telehealth: Payer: Self-pay | Admitting: Orthopaedic Surgery

## 2021-02-03 NOTE — Telephone Encounter (Signed)
Patient called - states she has been calling [facility], and has not received a call back; however, there seems to be confusion between what patient understood, as she states she was given a phone number for Dr Murray Hodgkins at [facility]. Their office has confirmed that they have not seen this patient; nor are we seeing a referral to their office. Patient has seen, as per referral, by Dr Alvester Morin at [facility]. Per review of referrals,  patient has also been referred for physical therapy, and notes indicate that [facility], [facility] was unable to reach her. She will call them today to try to schedule.  Please advise if patient is to be referred to neurosurgeon at this point, as she said she was given the name of Dr Murray Hodgkins at [facility], whom she has never seen.

## 2021-02-09 ENCOUNTER — Telehealth: Payer: Self-pay | Admitting: Orthopaedic Surgery

## 2021-02-09 MED ORDER — HYDROCODONE-ACETAMINOPHEN 7.5-325 MG PO TABS: ORAL_TABLET | ORAL | 0 refills | Status: DC

## 2021-02-09 NOTE — Telephone Encounter (Signed)
Patient called for refill HYDROcodone-acetaminophen (NORCO) 7.5-325 MG tablet 56 tablet       [facility], [address], [place]

## 2021-02-16 ENCOUNTER — Other Ambulatory Visit: Payer: Self-pay | Admitting: Orthopaedic Surgery

## 2021-02-16 DIAGNOSIS — M79604 Pain in right leg: Secondary | ICD-10-CM

## 2021-02-16 DIAGNOSIS — G8929 Other chronic pain: Secondary | ICD-10-CM

## 2021-02-22 ENCOUNTER — Telehealth: Payer: Self-pay | Admitting: Orthopaedic Surgery

## 2021-02-22 MED ORDER — HYDROCODONE-ACETAMINOPHEN 7.5-325 MG PO TABS: ORAL_TABLET | ORAL | 0 refills | Status: DC

## 2021-02-22 NOTE — Telephone Encounter (Signed)
Patient called requesting refill for her pain medicine.    HYDROcodone-acetaminophen (NORCO) 7.5-325 MG tablet   Pharmacy [facility] on [address]  

## 2021-03-03 ENCOUNTER — Telehealth: Payer: Self-pay | Admitting: Radiology

## 2021-03-03 NOTE — Telephone Encounter (Signed)
Will you make addendum to office visit note from 12/1//22 patient has not seen Dr Murray Hodgkins so we can resend the referral?

## 2021-03-07 ENCOUNTER — Telehealth: Payer: Self-pay

## 2021-03-07 ENCOUNTER — Other Ambulatory Visit: Payer: Self-pay | Admitting: Neurosurgery

## 2021-03-07 MED ORDER — HYDROCODONE-ACETAMINOPHEN 7.5-325 MG PO TABS: ORAL_TABLET | ORAL | 0 refills | Status: DC

## 2021-03-07 NOTE — Telephone Encounter (Signed)
Hydrocodone-Acetaminophen 7.5/325 mg Qty 50 Tablets  PATIENT USES [facility] [address]

## 2021-03-07 NOTE — Addendum Note (Signed)
Addended byCaffie Damme on: 03/07/2021 10:39 AM   Modules accepted: Orders

## 2021-03-07 NOTE — Telephone Encounter (Signed)
Thanks cancelled the referral

## 2021-03-20 ENCOUNTER — Telehealth: Payer: Self-pay

## 2021-03-20 MED ORDER — HYDROCODONE-ACETAMINOPHEN 7.5-325 MG PO TABS: ORAL_TABLET | ORAL | 0 refills | Status: DC

## 2021-03-20 NOTE — Telephone Encounter (Signed)
Patient is requesting refill Hydrocodone-Acetaminophen 7.5/325 mg  Qty  45 Tablets  PATIENT USES [facility] [address]  ** Patient was seen by Dr. Franky Macho on 03/07/21 and is scheduled for surgery on 05/03/21 I told her that she is under his care now and you may not do this request.

## 2021-04-03 ENCOUNTER — Telehealth: Payer: Self-pay | Admitting: Orthopaedic Surgery

## 2021-04-03 MED ORDER — HYDROCODONE-ACETAMINOPHEN 7.5-325 MG PO TABS: ORAL_TABLET | ORAL | 0 refills | Status: DC

## 2021-04-03 NOTE — Telephone Encounter (Signed)
Patient called to request refill  HYDROcodone-acetaminophen (NORCO) 7.5-325 MG tablet 38 tablet      [facility] on [address], [place] *     *patient relays she has seen neurosurgeon, Dr Franky Macho, and states has surgery scheduled 05/03/21.

## 2021-04-17 ENCOUNTER — Other Ambulatory Visit: Payer: Self-pay

## 2021-04-17 MED ORDER — HYDROCODONE-ACETAMINOPHEN 7.5-325 MG PO TABS: ORAL_TABLET | ORAL | 0 refills | Status: DC

## 2021-04-17 NOTE — Telephone Encounter (Signed)
Patient is requesting refill of Hydrocodone-Acetaminophen 7.5/325 mg  Qty 30 Tablets 14 day limit.  PATIENT USES [facility] ON [address]   **Patient has surgery scheduled with Dr. Christella Noa on 05/03/21. **

## 2021-04-17 NOTE — Telephone Encounter (Signed)
Dr Kirtland Bouchard. Patient

## 2021-04-28 NOTE — Pre-Procedure Instructions (Signed)
Surgical Instructions ? ? ? Your procedure is scheduled on Wednesday 05/03/21. ? ? Report to [facility] "A" at 06:30 A.M., then check in with the Admitting office. ? Call this number if you have problems the morning of surgery: ? [phone] ? ? If you have any questions prior to your surgery date call [phone]: Open Monday-Friday 8am-4pm ? ? ? Remember: ? Do not eat after midnight the night before your surgery ? ?You may drink clear liquids until 05:30 A.M. the morning of your surgery.   ?Clear liquids allowed are: Water, Non-Citrus Juices (without pulp), Carbonated Beverages, Clear Tea, Black Coffee ONLY (NO MILK, CREAM OR POWDERED CREAMER of any kind), and Gatorade ?  ? Take these medicines the morning of surgery with A SIP OF WATER:  ? escitalopram (LEXAPRO) ? Fluticasone-Umeclidin-Vilant  ? gabapentin (NEURONTIN) ? pantoprazole (PROTONIX) ? ?  ? Take these medicines if needed:  ? albuterol (PROVENTIL HFA;VENTOLIN HFA)  ? HYDROcodone-acetaminophen ([place])  ? ?As of today, STOP taking any Aspirin (unless otherwise instructed by your surgeon) Aleve, Naproxen, Ibuprofen, Motrin, Advil, Goody's, BC's, all herbal medications, fish oil, and all vitamins. ? ?         ?Do not wear jewelry or makeup ?Do not wear lotions, powders, perfumes/colognes, or deodorant. ?Do not shave 48 hours prior to surgery.  Men may shave face and neck. ?Do not bring valuables to the hospital. ?Do not wear nail polish, gel polish, artificial nails, or any other type of covering on natural nails (fingers and toes) ?If you have artificial nails or gel coating that need to be removed by a nail salon, please have this removed prior to surgery. Artificial nails or gel coating may interfere with anesthesia's ability to adequately monitor your vital signs. ? ?[facility] is not responsible for any belongings or valuables. .  ? ?Do NOT Smoke (Tobacco/Vaping)  24 hours prior to your procedure ? ?If you use a CPAP at night, you may  bring your mask for your overnight stay. ?  ?Contacts, glasses, hearing aids, dentures or partials may not be worn into surgery, please bring cases for these belongings ?  ?For patients admitted to the hospital, discharge time will be determined by your treatment team. ?  ?Patients discharged the day of surgery will not be allowed to drive home, and someone needs to stay with them for 24 hours. ? ?NO VISITORS WILL BE ALLOWED IN PRE-OP WHERE PATIENTS ARE PREPPED FOR SURGERY.  ONLY 1 SUPPORT PERSON MAY BE PRESENT IN THE WAITING ROOM WHILE YOU ARE IN SURGERY.  IF YOU ARE TO BE ADMITTED, ONCE YOU ARE IN YOUR ROOM YOU WILL BE ALLOWED TWO (2) VISITORS. 1 (ONE) VISITOR MAY STAY OVERNIGHT BUT MUST ARRIVE TO THE ROOM BY 8pm.  Minor children may have two parents present. Special consideration for safety and communication needs will be reviewed on a case by case basis. ? ?Special instructions:   ? ?Oral Hygiene is also important to reduce your risk of infection.  Remember - BRUSH YOUR TEETH THE MORNING OF SURGERY WITH YOUR REGULAR TOOTHPASTE ? ? ?[facility]- Preparing For Surgery ? ?Before surgery, you can play an important role. Because skin is not sterile, your skin needs to be as free of germs as possible. You can reduce the number of germs on your skin by washing with CHG (chlorahexidine gluconate) Soap before surgery.  CHG is an antiseptic cleaner which kills germs and bonds with the skin to continue killing germs even  after washing.   ? ? ?Please do not use if you have an allergy to CHG or antibacterial soaps. If your skin becomes reddened/irritated stop using the CHG.  ?Do not shave (including legs and underarms) for at least 48 hours prior to first CHG shower. It is OK to shave your face. ? ?Please follow these instructions carefully. ?  ? ? Shower the NIGHT BEFORE SURGERY and the MORNING OF SURGERY with CHG Soap.  ? If you chose to wash your hair, wash your hair first as usual with your normal shampoo. After you  shampoo, rinse your hair and body thoroughly to remove the shampoo.  Then [employer] and genitals (private parts) with your normal soap and rinse thoroughly to remove soap. ? ?After that Use CHG Soap as you would any other liquid soap. You can apply CHG directly to the skin and wash gently with a scrungie or a clean washcloth.  ? ?Apply the CHG Soap to your body ONLY FROM THE NECK DOWN.  Do not use on open wounds or open sores. Avoid contact with your eyes, ears, mouth and genitals (private parts). Wash Face and genitals (private parts)  with your normal soap.  ? ?Wash thoroughly, paying special attention to the area where your surgery will be performed. ? ?Thoroughly rinse your body with warm water from the neck down. ? ?DO NOT shower/wash with your normal soap after using and rinsing off the CHG Soap. ? ?Pat yourself dry with a CLEAN TOWEL. ? ?Wear CLEAN PAJAMAS to bed the night before surgery ? ?Place CLEAN SHEETS on your bed the night before your surgery ? ?DO NOT SLEEP WITH PETS. ? ? ?Day of Surgery: ? ?Take a shower with CHG soap. ?Wear Clean/Comfortable clothing the morning of surgery ?Do not apply any deodorants/lotions.   ?Remember to brush your teeth WITH YOUR REGULAR TOOTHPASTE. ? ? ? ?COVID testing ? ?If you are going to stay overnight or be admitted after your procedure/surgery and require a pre-op COVID test, please follow these instructions after your COVID test  ? ?You are not required to quarantine however you are required to wear a well-fitting mask when you are out and around people not in your household.  If your mask becomes wet or soiled, replace with a new one. ? ?Wash your hands often with soap and water for 20 seconds or clean your hands with an alcohol-based hand sanitizer that contains at least 60% alcohol. ? ?Do not share personal items. ? ?Notify your provider: ?if you are in close contact with someone who has COVID  ?or if you develop a fever of 100.4 or greater, sneezing, cough, sore  throat, shortness of breath or body aches. ? ?  ?Please read over the following fact sheets that you were given.  ? ?

## 2021-05-01 ENCOUNTER — Other Ambulatory Visit: Payer: Self-pay

## 2021-05-01 ENCOUNTER — Other Ambulatory Visit (HOSPITAL_COMMUNITY): Payer: Medicare Other

## 2021-05-01 ENCOUNTER — Encounter (HOSPITAL_COMMUNITY)
Admission: RE | Admit: 2021-05-01 | Discharge: 2021-05-01 | Disposition: A | Discharge status: Home / Self Care | Payer: Medicare Other | Source: Ambulatory Visit | Attending: Neurosurgery | Admitting: Neurosurgery

## 2021-05-01 ENCOUNTER — Encounter (HOSPITAL_COMMUNITY): Payer: Self-pay

## 2021-05-01 VITALS — BP 144/82 | HR 89 | Temp 98.1°F | Resp 18 | Ht <= 58 in | Wt 116.7 lb

## 2021-05-01 DIAGNOSIS — Z01818 Encounter for other preprocedural examination: Secondary | ICD-10-CM

## 2021-05-01 DIAGNOSIS — Z20822 Contact with and (suspected) exposure to covid-19: Secondary | ICD-10-CM | POA: Insufficient documentation

## 2021-05-01 DIAGNOSIS — I10 Essential (primary) hypertension: Secondary | ICD-10-CM | POA: Insufficient documentation

## 2021-05-01 DIAGNOSIS — Z01812 Encounter for preprocedural laboratory examination: Secondary | ICD-10-CM | POA: Diagnosis present

## 2021-05-01 HISTORY — DX: Unspecified osteoarthritis, unspecified site: M19.90

## 2021-05-01 LAB — BASIC METABOLIC PANEL
Anion gap: 6 (ref 5–15)
BUN: 8 mg/dL (ref 8–23)
CO2: 24 mmol/L (ref 22–32)
Calcium: 8.7 mg/dL — ABNORMAL LOW (ref 8.9–10.3)
Chloride: 112 mmol/L — ABNORMAL HIGH (ref 98–111)
Creatinine, Ser: 0.52 mg/dL (ref 0.44–1.00)
GFR, Estimated: >60 mL/min (ref >60)
Glucose, Bld: 99 mg/dL (ref 70–99)
Potassium: 3.9 mmol/L (ref 3.5–5.1)
Sodium: 142 mmol/L (ref 135–145)

## 2021-05-01 LAB — CBC
HCT: 43.1 % (ref 36.0–46.0)
Hemoglobin: 14.1 g/dL (ref 12.0–15.0)
MCH: 32.1 pg (ref 26.0–34.0)
MCHC: 32.7 g/dL (ref 30.0–36.0)
MCV: 98.2 fL (ref 80.0–100.0)
Platelets: 372 10*3/uL (ref 150–400)
RBC: 4.39 MIL/uL (ref 3.87–5.11)
RDW: 13.9 % (ref 11.5–15.5)
WBC: 3.9 10*3/uL — ABNORMAL LOW (ref 4.0–10.5)
nRBC: 0 % (ref 0.0–0.2)

## 2021-05-01 LAB — SARS CORONAVIRUS 2 (TAT 6-24 HRS): SARS Coronavirus 2: NEGATIVE

## 2021-05-01 LAB — TYPE AND SCREEN
ABO/RH(D): O POS
Antibody Screen: NEGATIVE

## 2021-05-01 LAB — SURGICAL PCR SCREEN
MRSA, PCR: POSITIVE — AB
Staphylococcus aureus: POSITIVE — AB

## 2021-05-01 NOTE — Progress Notes (Signed)
PCP - Herbert Deaner, FNP ?Cardiologist - denies ? ?PPM/ICD - denies ? ? ?Chest x-ray - 08/18/20 ?EKG - 07/24/20 ?Stress Test - denies ?ECHO - denies ?Cardiac Cath - denies ? ?Sleep Study - denies ? ?DM- denies ? ? ?ASA/Blood Thinner Instructions: n/a ? ? ?ERAS Protcol - yes, no drink ? ? ?COVID TEST- 05/01/21 ? ? ?Anesthesia review: no ? ?Patient denies shortness of breath, fever, cough and chest pain at PAT appointment ? ? ?All instructions explained to the patient, with a verbal understanding of the material. Patient agrees to go over the instructions while at home for a better understanding. Patient also instructed to wear a mask in public after being tested for COVID-19. The opportunity to ask questions was provided. ?  ?

## 2021-05-01 NOTE — Pre-Procedure Instructions (Signed)
Surgical Instructions ? ? ? Your procedure is scheduled on Wednesday 05/03/21. ? ? Report to [facility] "A" at 06:30 A.M., then check in with the Admitting office. ? Call this number if you have problems the morning of surgery: ? [phone] ? ? If you have any questions prior to your surgery date call [phone]: Open Monday-Friday 8am-4pm ? ? ? Remember: ? Do not eat after midnight the night before your surgery ? ?You may drink clear liquids until 05:30 A.M. the morning of your surgery.   ?Clear liquids allowed are: Water, Non-Citrus Juices (without pulp), Carbonated Beverages, Clear Tea, Black Coffee ONLY (NO MILK, CREAM OR POWDERED CREAMER of any kind), and Gatorade ?  ? Take these medicines the morning of surgery with A SIP OF WATER:  ? pantoprazole (PROTONIX) ? ?  ? Take these medicines if needed:  ? albuterol (PROVENTIL HFA;VENTOLIN HFA)  ? HYDROcodone-acetaminophen ([place])  ? ?As of today, STOP taking any Aspirin (unless otherwise instructed by your surgeon) Aleve, Naproxen, Ibuprofen, Motrin, Advil, Goody's, BC's, all herbal medications, fish oil, and all vitamins. ? ?         ?Do not wear jewelry or makeup ?Do not wear lotions, powders, perfumes/colognes, or deodorant. ?Do not shave 48 hours prior to surgery.  Men may shave face and neck. ?Do not bring valuables to the hospital. ?Do not wear nail polish, gel polish, artificial nails, or any other type of covering on natural nails (fingers and toes) ?If you have artificial nails or gel coating that need to be removed by a nail salon, please have this removed prior to surgery. Artificial nails or gel coating may interfere with anesthesia's ability to adequately monitor your vital signs. ? ?[facility] is not responsible for any belongings or valuables. .  ? ?Do NOT Smoke (Tobacco/Vaping)  24 hours prior to your procedure ? ?If you use a CPAP at night, you may bring your mask for your overnight stay. ?  ?Contacts, glasses, hearing aids,  dentures or partials may not be worn into surgery, please bring cases for these belongings ?  ?For patients admitted to the hospital, discharge time will be determined by your treatment team. ?  ?Patients discharged the day of surgery will not be allowed to drive home, and someone needs to stay with them for 24 hours. ? ?NO VISITORS WILL BE ALLOWED IN PRE-OP WHERE PATIENTS ARE PREPPED FOR SURGERY.  ONLY 1 SUPPORT PERSON MAY BE PRESENT IN THE WAITING ROOM WHILE YOU ARE IN SURGERY.  IF YOU ARE TO BE ADMITTED, ONCE YOU ARE IN YOUR ROOM YOU WILL BE ALLOWED TWO (2) VISITORS. 1 (ONE) VISITOR MAY STAY OVERNIGHT BUT MUST ARRIVE TO THE ROOM BY 8pm.  Minor children may have two parents present. Special consideration for safety and communication needs will be reviewed on a case by case basis. ? ?Special instructions:   ? ?Oral Hygiene is also important to reduce your risk of infection.  Remember - BRUSH YOUR TEETH THE MORNING OF SURGERY WITH YOUR REGULAR TOOTHPASTE ? ? ?[facility]- Preparing For Surgery ? ?Before surgery, you can play an important role. Because skin is not sterile, your skin needs to be as free of germs as possible. You can reduce the number of germs on your skin by washing with CHG (chlorahexidine gluconate) Soap before surgery.  CHG is an antiseptic cleaner which kills germs and bonds with the skin to continue killing germs even after washing.   ? ? ?Please do not  use if you have an allergy to CHG or antibacterial soaps. If your skin becomes reddened/irritated stop using the CHG.  ?Do not shave (including legs and underarms) for at least 48 hours prior to first CHG shower. It is OK to shave your face. ? ?Please follow these instructions carefully. ?  ? ? Shower the NIGHT BEFORE SURGERY and the MORNING OF SURGERY with CHG Soap.  ? If you chose to wash your hair, wash your hair first as usual with your normal shampoo. After you shampoo, rinse your hair and body thoroughly to remove the shampoo.  Then [employer] and genitals (private parts) with your normal soap and rinse thoroughly to remove soap. ? ?After that Use CHG Soap as you would any other liquid soap. You can apply CHG directly to the skin and wash gently with a scrungie or a clean washcloth.  ? ?Apply the CHG Soap to your body ONLY FROM THE NECK DOWN.  Do not use on open wounds or open sores. Avoid contact with your eyes, ears, mouth and genitals (private parts). Wash Face and genitals (private parts)  with your normal soap.  ? ?Wash thoroughly, paying special attention to the area where your surgery will be performed. ? ?Thoroughly rinse your body with warm water from the neck down. ? ?DO NOT shower/wash with your normal soap after using and rinsing off the CHG Soap. ? ?Pat yourself dry with a CLEAN TOWEL. ? ?Wear CLEAN PAJAMAS to bed the night before surgery ? ?Place CLEAN SHEETS on your bed the night before your surgery ? ?DO NOT SLEEP WITH PETS. ? ? ?Day of Surgery: ? ?Take a shower with CHG soap. ?Wear Clean/Comfortable clothing the morning of surgery ?Do not apply any deodorants/lotions.   ?Remember to brush your teeth WITH YOUR REGULAR TOOTHPASTE. ? ? ? ?COVID testing ? ?If you are going to stay overnight or be admitted after your procedure/surgery and require a pre-op COVID test, please follow these instructions after your COVID test  ? ?You are not required to quarantine however you are required to wear a well-fitting mask when you are out and around people not in your household.  If your mask becomes wet or soiled, replace with a new one. ? ?Wash your hands often with soap and water for 20 seconds or clean your hands with an alcohol-based hand sanitizer that contains at least 60% alcohol. ? ?Do not share personal items. ? ?Notify your provider: ?if you are in close contact with someone who has COVID  ?or if you develop a fever of 100.4 or greater, sneezing, cough, sore throat, shortness of breath or body aches. ? ?  ?Please read over the following  fact sheets that you were given.  ? ?

## 2021-05-01 NOTE — Anesthesia Preprocedure Evaluation (Deleted)
Anesthesia Evaluation  ? ? ?Reviewed: ?Allergy & Precautions, Patient's Chart, lab work & pertinent test results, Unable to perform ROS - Chart review only ? ?Airway ? ? ? ? ? ? ? Dental ?  ?Pulmonary ?asthma , pneumonia, COPD, Current Smoker,  ?  ? ? ? ? ? ? ? Cardiovascular ?hypertension,  ? ? ?  ?Neuro/Psych ?PSYCHIATRIC DISORDERS Anxiety Depression  Neuromuscular disease   ? GI/Hepatic ?Neg liver ROS, PUD,   ?Endo/Other  ?negative endocrine ROS ? Renal/GU ?negative Renal ROS  ? ?  ?Musculoskeletal ? ?(+) Arthritis ,  ? Abdominal ?  ?Peds ? Hematology ?negative hematology ROS ?(+)   ?Anesthesia Other Findings ? ? Reproductive/Obstetrics ? ?  ? ? ? ? ? ? ? ? ? ? ? ? ? ?  ?  ? ? ? ? ? ? ? ? ?Anesthesia Physical ?Anesthesia Plan ? ?ASA: 2 ? ?Anesthesia Plan: General  ? ?Post-op Pain Management: Tylenol PO (pre-op)* and Gabapentin PO (pre-op)*  ? ?Induction: Intravenous ? ?PONV Risk Score and Plan: 3 and Ondansetron, Dexamethasone and Treatment may vary due to age or medical condition ? ?Airway Management Planned: Oral ETT ? ?Additional Equipment:  ? ?Intra-op Plan:  ? ?Post-operative Plan: Extubation in OR ? ?Informed Consent:  ? ?Plan Discussed with:  ? ?Anesthesia Plan Comments:   ? ? ? ? ? ?Anesthesia Quick Evaluation ? ?

## 2021-05-03 ENCOUNTER — Inpatient Hospital Stay (HOSPITAL_COMMUNITY): Payer: Medicare Other | Admitting: Anesthesiology

## 2021-05-03 ENCOUNTER — Other Ambulatory Visit: Payer: Self-pay

## 2021-05-03 ENCOUNTER — Inpatient Hospital Stay (HOSPITAL_COMMUNITY): Payer: Medicare Other

## 2021-05-03 ENCOUNTER — Inpatient Hospital Stay (HOSPITAL_COMMUNITY)
Admission: RE | Disposition: A | Discharge status: Home Health | Payer: Self-pay | Source: Home / Self Care | Attending: Neurosurgery

## 2021-05-03 ENCOUNTER — Observation Stay (HOSPITAL_COMMUNITY)
Admission: RE | Admit: 2021-05-03 | Discharge: 2021-05-04 | Disposition: A | Discharge status: Home Health | Payer: Medicare Other | Attending: Neurosurgery | Admitting: Neurosurgery

## 2021-05-03 ENCOUNTER — Encounter (HOSPITAL_COMMUNITY): Payer: Self-pay | Admitting: Neurosurgery

## 2021-05-03 ENCOUNTER — Inpatient Hospital Stay (HOSPITAL_BASED_OUTPATIENT_CLINIC_OR_DEPARTMENT_OTHER): Payer: Medicare Other | Admitting: Anesthesiology

## 2021-05-03 DIAGNOSIS — J449 Chronic obstructive pulmonary disease, unspecified: Secondary | ICD-10-CM

## 2021-05-03 DIAGNOSIS — I10 Essential (primary) hypertension: Secondary | ICD-10-CM

## 2021-05-03 DIAGNOSIS — M4316 Spondylolisthesis, lumbar region: Secondary | ICD-10-CM

## 2021-05-03 DIAGNOSIS — F418 Other specified anxiety disorders: Secondary | ICD-10-CM | POA: Diagnosis not present

## 2021-05-03 DIAGNOSIS — R2689 Other abnormalities of gait and mobility: Secondary | ICD-10-CM | POA: Diagnosis not present

## 2021-05-03 DIAGNOSIS — Z79899 Other long term (current) drug therapy: Secondary | ICD-10-CM | POA: Insufficient documentation

## 2021-05-03 DIAGNOSIS — Z419 Encounter for procedure for purposes other than remedying health state, unspecified: Secondary | ICD-10-CM

## 2021-05-03 LAB — CBC
HCT: 39.1 % (ref 36.0–46.0)
Hemoglobin: 12.7 g/dL (ref 12.0–15.0)
MCH: 32 pg (ref 26.0–34.0)
MCHC: 32.5 g/dL (ref 30.0–36.0)
MCV: 98.5 fL (ref 80.0–100.0)
Platelets: 302 10*3/uL (ref 150–400)
RBC: 3.97 MIL/uL (ref 3.87–5.11)
RDW: 13.8 % (ref 11.5–15.5)
WBC: 5.9 10*3/uL (ref 4.0–10.5)
nRBC: 0 % (ref 0.0–0.2)

## 2021-05-03 LAB — CREATININE, SERUM
Creatinine, Ser: 0.47 mg/dL (ref 0.44–1.00)
GFR, Estimated: >60 mL/min (ref >60)

## 2021-05-03 LAB — ABO/RH: ABO/RH(D): O POS

## 2021-05-03 SURGERY — POSTERIOR LUMBAR FUSION 1 LEVEL
Anesthesia: General | Site: Spine Lumbar

## 2021-05-03 MED ORDER — ACETAMINOPHEN 500 MG PO TABS
1000.0000 mg | ORAL_TABLET | Freq: Once | ORAL | Status: AC
  Administered 2021-05-03: 1000 mg via ORAL
  Filled 2021-05-03: qty 2

## 2021-05-03 MED ORDER — ONDANSETRON HCL 4 MG/2ML IJ SOLN
INTRAMUSCULAR | Status: DC | PRN
  Administered 2021-05-03: 4 mg via INTRAVENOUS

## 2021-05-03 MED ORDER — ALBUTEROL SULFATE HFA 108 (90 BASE) MCG/ACT IN AERS: 1.0000 | INHALATION_SPRAY | Freq: Four times a day (QID) | RESPIRATORY_TRACT | Status: DC | PRN

## 2021-05-03 MED ORDER — GELATIN ABSORBABLE 100 EX MISC: CUTANEOUS | Status: DC | PRN

## 2021-05-03 MED ORDER — SODIUM CHLORIDE 0.9% FLUSH
3.0000 mL | Freq: Two times a day (BID) | INTRAVENOUS | Status: DC
  Administered 2021-05-03: 3 mL via INTRAVENOUS

## 2021-05-03 MED ORDER — SUCCINYLCHOLINE CHLORIDE 200 MG/10ML IV SOSY
PREFILLED_SYRINGE | INTRAVENOUS | Status: DC | PRN
  Administered 2021-05-03: 100 mg via INTRAVENOUS

## 2021-05-03 MED ORDER — FLEET ENEMA 7-19 GM/118ML RE ENEM: 1.0000 | ENEMA | Freq: Once | RECTAL | Status: DC | PRN

## 2021-05-03 MED ORDER — CEFAZOLIN SODIUM-DEXTROSE 2-4 GM/100ML-% IV SOLN
2.0000 g | INTRAVENOUS | Status: AC
  Administered 2021-05-03: 2 g via INTRAVENOUS
  Filled 2021-05-03: qty 100

## 2021-05-03 MED ORDER — BUPIVACAINE HCL (PF) 0.5 % IJ SOLN
INTRAMUSCULAR | Status: DC | PRN
  Administered 2021-05-03: 30 mL

## 2021-05-03 MED ORDER — ONDANSETRON HCL 4 MG/2ML IJ SOLN: 4.0000 mg | Freq: Four times a day (QID) | INTRAMUSCULAR | Status: DC | PRN

## 2021-05-03 MED ORDER — PROPOFOL 10 MG/ML IV BOLUS
INTRAVENOUS | Status: AC
  Filled 2021-05-03: qty 20

## 2021-05-03 MED ORDER — ONDANSETRON HCL 4 MG PO TABS: 4.0000 mg | ORAL_TABLET | Freq: Four times a day (QID) | ORAL | Status: DC | PRN

## 2021-05-03 MED ORDER — ACETAMINOPHEN 650 MG RE SUPP: 650.0000 mg | RECTAL | Status: DC | PRN

## 2021-05-03 MED ORDER — HEPARIN SODIUM (PORCINE) 5000 UNIT/ML IJ SOLN
5000.0000 [IU] | Freq: Three times a day (TID) | INTRAMUSCULAR | Status: DC
  Administered 2021-05-03 – 2021-05-04 (×2): 5000 [IU] via SUBCUTANEOUS
  Filled 2021-05-03 (×2): qty 1

## 2021-05-03 MED ORDER — MEPERIDINE HCL 25 MG/ML IJ SOLN: 6.2500 mg | INTRAMUSCULAR | Status: DC | PRN

## 2021-05-03 MED ORDER — 0.9 % SODIUM CHLORIDE (POUR BTL) OPTIME
TOPICAL | Status: DC | PRN
  Administered 2021-05-03: 1000 mL

## 2021-05-03 MED ORDER — ACETAMINOPHEN 325 MG PO TABS
650.0000 mg | ORAL_TABLET | ORAL | Status: DC | PRN
  Administered 2021-05-03 – 2021-05-04 (×3): 650 mg via ORAL
  Filled 2021-05-03 (×3): qty 2

## 2021-05-03 MED ORDER — BUPIVACAINE HCL (PF) 0.25 % IJ SOLN
INTRAMUSCULAR | Status: AC
  Filled 2021-05-03: qty 30

## 2021-05-03 MED ORDER — DIAZEPAM 5 MG PO TABS
5.0000 mg | ORAL_TABLET | Freq: Four times a day (QID) | ORAL | Status: DC | PRN
  Administered 2021-05-03: 5 mg via ORAL
  Filled 2021-05-03: qty 1

## 2021-05-03 MED ORDER — PHENOL 1.4 % MT LIQD: 1.0000 | OROMUCOSAL | Status: DC | PRN

## 2021-05-03 MED ORDER — GABAPENTIN 300 MG PO CAPS
300.0000 mg | ORAL_CAPSULE | Freq: Once | ORAL | Status: AC
  Administered 2021-05-03: 300 mg via ORAL
  Filled 2021-05-03: qty 1

## 2021-05-03 MED ORDER — OXYCODONE HCL 5 MG PO TABS: 5.0000 mg | ORAL_TABLET | Freq: Once | ORAL | Status: DC | PRN

## 2021-05-03 MED ORDER — SODIUM CHLORIDE 0.9% FLUSH: 3.0000 mL | INTRAVENOUS | Status: DC | PRN

## 2021-05-03 MED ORDER — MIDAZOLAM HCL 2 MG/2ML IJ SOLN
INTRAMUSCULAR | Status: AC
  Filled 2021-05-03: qty 2

## 2021-05-03 MED ORDER — LACTATED RINGERS IV SOLN
INTRAVENOUS | Status: DC
Start: 2021-05-03 — End: 2021-05-03

## 2021-05-03 MED ORDER — SENNA 8.6 MG PO TABS
1.0000 | ORAL_TABLET | Freq: Two times a day (BID) | ORAL | Status: DC
  Administered 2021-05-03 – 2021-05-04 (×3): 8.6 mg via ORAL
  Filled 2021-05-03 (×3): qty 1

## 2021-05-03 MED ORDER — ONDANSETRON HCL 4 MG/2ML IJ SOLN
INTRAMUSCULAR | Status: AC
  Administered 2021-05-03: 4 mg via INTRAVENOUS
  Filled 2021-05-03: qty 2

## 2021-05-03 MED ORDER — BISACODYL 5 MG PO TBEC: 5.0000 mg | DELAYED_RELEASE_TABLET | Freq: Every day | ORAL | Status: DC | PRN

## 2021-05-03 MED ORDER — POTASSIUM CHLORIDE IN NACL 20-0.9 MEQ/L-% IV SOLN: INTRAVENOUS | Status: DC

## 2021-05-03 MED ORDER — FENTANYL CITRATE (PF) 100 MCG/2ML IJ SOLN: 25.0000 ug | INTRAMUSCULAR | Status: DC | PRN

## 2021-05-03 MED ORDER — CHLORHEXIDINE GLUCONATE CLOTH 2 % EX PADS
6.0000 | MEDICATED_PAD | Freq: Once | CUTANEOUS | Status: DC
Start: 2021-05-03 — End: 2021-05-03

## 2021-05-03 MED ORDER — DEXAMETHASONE SODIUM PHOSPHATE 4 MG/ML IJ SOLN
INTRAMUSCULAR | Status: DC | PRN
  Administered 2021-05-03: 10 mg via INTRAVENOUS

## 2021-05-03 MED ORDER — LIDOCAINE-EPINEPHRINE 0.5 %-1:200000 IJ SOLN
INTRAMUSCULAR | Status: AC
  Filled 2021-05-03: qty 1

## 2021-05-03 MED ORDER — SENNOSIDES-DOCUSATE SODIUM 8.6-50 MG PO TABS: 1.0000 | ORAL_TABLET | Freq: Every evening | ORAL | Status: DC | PRN

## 2021-05-03 MED ORDER — MIDAZOLAM HCL 5 MG/5ML IJ SOLN
INTRAMUSCULAR | Status: DC | PRN
Start: 2021-05-03 — End: 2021-05-03
  Administered 2021-05-03 (×2): 1 mg via INTRAVENOUS

## 2021-05-03 MED ORDER — FENTANYL CITRATE (PF) 250 MCG/5ML IJ SOLN
INTRAMUSCULAR | Status: AC
  Filled 2021-05-03: qty 5

## 2021-05-03 MED ORDER — SUGAMMADEX SODIUM 200 MG/2ML IV SOLN
INTRAVENOUS | Status: DC | PRN
  Administered 2021-05-03: 200 mg via INTRAVENOUS

## 2021-05-03 MED ORDER — OXYCODONE HCL 5 MG PO TABS
10.0000 mg | ORAL_TABLET | ORAL | Status: DC | PRN
  Administered 2021-05-04: 10 mg via ORAL
  Filled 2021-05-03: qty 2

## 2021-05-03 MED ORDER — CHLORHEXIDINE GLUCONATE CLOTH 2 % EX PADS: 6.0000 | MEDICATED_PAD | Freq: Once | CUTANEOUS | Status: DC

## 2021-05-03 MED ORDER — OXYCODONE HCL ER 10 MG PO T12A
10.0000 mg | EXTENDED_RELEASE_TABLET | Freq: Two times a day (BID) | ORAL | Status: DC
  Administered 2021-05-03 – 2021-05-04 (×3): 10 mg via ORAL
  Filled 2021-05-03 (×3): qty 1

## 2021-05-03 MED ORDER — OXYCODONE HCL 5 MG/5ML PO SOLN: 5.0000 mg | Freq: Once | ORAL | Status: DC | PRN

## 2021-05-03 MED ORDER — SODIUM CHLORIDE 0.9 % IV SOLN
250.0000 mL | INTRAVENOUS | Status: DC
  Administered 2021-05-03: 250 mL via INTRAVENOUS

## 2021-05-03 MED ORDER — ROCURONIUM BROMIDE 10 MG/ML (PF) SYRINGE
PREFILLED_SYRINGE | INTRAVENOUS | Status: DC | PRN
  Administered 2021-05-03: 60 mg via INTRAVENOUS
  Administered 2021-05-03: 40 mg via INTRAVENOUS

## 2021-05-03 MED ORDER — EPHEDRINE SULFATE-NACL 50-0.9 MG/10ML-% IV SOSY
PREFILLED_SYRINGE | INTRAVENOUS | Status: DC | PRN
  Administered 2021-05-03: 15 mg via INTRAVENOUS
  Administered 2021-05-03: 10 mg via INTRAVENOUS

## 2021-05-03 MED ORDER — LISINOPRIL 20 MG PO TABS: 20.0000 mg | ORAL_TABLET | Freq: Every day | ORAL | Status: DC

## 2021-05-03 MED ORDER — OXYCODONE HCL 5 MG PO TABS
5.0000 mg | ORAL_TABLET | ORAL | Status: DC | PRN
  Administered 2021-05-03 – 2021-05-04 (×2): 5 mg via ORAL
  Filled 2021-05-03 (×2): qty 1

## 2021-05-03 MED ORDER — PHENYLEPHRINE 40 MCG/ML (10ML) SYRINGE FOR IV PUSH (FOR BLOOD PRESSURE SUPPORT)
PREFILLED_SYRINGE | INTRAVENOUS | Status: DC | PRN
  Administered 2021-05-03: 120 ug via INTRAVENOUS
  Administered 2021-05-03 (×3): 80 ug via INTRAVENOUS

## 2021-05-03 MED ORDER — ALBUTEROL SULFATE (2.5 MG/3ML) 0.083% IN NEBU: 2.5000 mg | INHALATION_SOLUTION | Freq: Four times a day (QID) | RESPIRATORY_TRACT | Status: DC | PRN

## 2021-05-03 MED ORDER — LACTATED RINGERS IV SOLN: INTRAVENOUS | Status: DC | PRN

## 2021-05-03 MED ORDER — ONDANSETRON HCL 4 MG/2ML IJ SOLN: 4.0000 mg | Freq: Once | INTRAMUSCULAR | Status: AC

## 2021-05-03 MED ORDER — MENTHOL 3 MG MT LOZG: 1.0000 | LOZENGE | OROMUCOSAL | Status: DC | PRN

## 2021-05-03 MED ORDER — VANCOMYCIN HCL IN DEXTROSE 1-5 GM/200ML-% IV SOLN
1000.0000 mg | Freq: Once | INTRAVENOUS | Status: AC
  Administered 2021-05-03: 1000 mg via INTRAVENOUS
  Filled 2021-05-03: qty 200

## 2021-05-03 MED ORDER — PHENYLEPHRINE HCL-NACL 20-0.9 MG/250ML-% IV SOLN
INTRAVENOUS | Status: DC | PRN
  Administered 2021-05-03: 50 ug/min via INTRAVENOUS

## 2021-05-03 MED ORDER — MORPHINE SULFATE (PF) 2 MG/ML IV SOLN: 2.0000 mg | INTRAVENOUS | Status: DC | PRN

## 2021-05-03 MED ORDER — CHLORHEXIDINE GLUCONATE 0.12 % MT SOLN
15.0000 mL | Freq: Once | OROMUCOSAL | Status: AC
  Administered 2021-05-03: 15 mL via OROMUCOSAL
  Filled 2021-05-03: qty 15

## 2021-05-03 MED ORDER — DROPERIDOL 2.5 MG/ML IJ SOLN: 0.6250 mg | Freq: Once | INTRAMUSCULAR | Status: DC | PRN

## 2021-05-03 MED ORDER — LIDOCAINE 2% (20 MG/ML) 5 ML SYRINGE
INTRAMUSCULAR | Status: DC | PRN
  Administered 2021-05-03: 60 mg via INTRAVENOUS

## 2021-05-03 MED ORDER — LIDOCAINE-EPINEPHRINE 0.5 %-1:200000 IJ SOLN
INTRAMUSCULAR | Status: DC | PRN
  Administered 2021-05-03: 19 mL

## 2021-05-03 MED ORDER — HYDROMORPHONE HCL 1 MG/ML IJ SOLN: 0.2500 mg | INTRAMUSCULAR | Status: DC | PRN

## 2021-05-03 MED ORDER — ORAL CARE MOUTH RINSE: 15.0000 mL | Freq: Once | OROMUCOSAL | Status: AC

## 2021-05-03 MED ORDER — FENTANYL CITRATE (PF) 100 MCG/2ML IJ SOLN
INTRAMUSCULAR | Status: DC | PRN
  Administered 2021-05-03: 100 ug via INTRAVENOUS
  Administered 2021-05-03 (×2): 50 ug via INTRAVENOUS

## 2021-05-03 MED ORDER — THROMBIN 20000 UNITS EX KIT
PACK | CUTANEOUS | Status: AC
  Filled 2021-05-03: qty 1

## 2021-05-03 MED ORDER — PROPOFOL 10 MG/ML IV BOLUS
INTRAVENOUS | Status: DC | PRN
  Administered 2021-05-03: 100 mg via INTRAVENOUS

## 2021-05-03 SURGICAL SUPPLY — 66 items
BAG COUNTER SPONGE SURGICOUNT (BAG) ×2 IMPLANT
BENZOIN TINCTURE PRP APPL 2/3 (GAUZE/BANDAGES/DRESSINGS) IMPLANT
BIT DRILL PLIF MAS DISP 5.5MM (DRILL) IMPLANT
BLADE BN FN 3.2XSTRL LF (MISCELLANEOUS) IMPLANT
BLADE BONE MILL FINE (MISCELLANEOUS) ×1
BLADE CLIPPER SURG (BLADE) IMPLANT
BUR MATCHSTICK NEURO 3.0 LAGG (BURR) ×2 IMPLANT
BUR PRECISION FLUTE 5.0 (BURR) ×2 IMPLANT
CAGE EXP PROLIFT 8X28X8 7D (Cage) ×2 IMPLANT
CANISTER SUCT 3000ML PPV (MISCELLANEOUS) ×2 IMPLANT
CAP RELINE MOD TULIP RMM (Cap) ×4 IMPLANT
CARTRIDGE OIL MAESTRO DRILL (MISCELLANEOUS) ×1 IMPLANT
CNTNR URN SCR LID CUP LEK RST (MISCELLANEOUS) ×1 IMPLANT
CONT SPEC 4OZ STRL OR WHT (MISCELLANEOUS) ×1
COVER BACK TABLE 60X90IN (DRAPES) ×2 IMPLANT
DECANTER SPIKE VIAL GLASS SM (MISCELLANEOUS) ×1 IMPLANT
DERMABOND ADVANCED (GAUZE/BANDAGES/DRESSINGS) ×1
DERMABOND ADVANCED .7 DNX12 (GAUZE/BANDAGES/DRESSINGS) ×1 IMPLANT
DIFFUSER DRILL AIR PNEUMATIC (MISCELLANEOUS) ×2 IMPLANT
DRAPE C-ARM 42X72 X-RAY (DRAPES) ×3 IMPLANT
DRAPE C-ARMOR (DRAPES) ×1 IMPLANT
DRAPE LAPAROTOMY 100X72X124 (DRAPES) ×2 IMPLANT
DRAPE SURG 17X23 STRL (DRAPES) ×2 IMPLANT
DRILL PLIF MAS DISP 5.5MM (DRILL) ×2
DRSG OPSITE POSTOP 4X6 (GAUZE/BANDAGES/DRESSINGS) ×1 IMPLANT
DURAPREP 26ML APPLICATOR (WOUND CARE) ×2 IMPLANT
ELECT REM PT RETURN 9FT ADLT (ELECTROSURGICAL) ×2
ELECTRODE REM PT RTRN 9FT ADLT (ELECTROSURGICAL) ×1 IMPLANT
GAUZE 4X4 16PLY ~~LOC~~+RFID DBL (SPONGE) IMPLANT
GAUZE SPONGE 4X4 12PLY STRL (GAUZE/BANDAGES/DRESSINGS) IMPLANT
GLOVE EXAM NITRILE XL STR (GLOVE) IMPLANT
GLOVE SURG LTX SZ6.5 (GLOVE) ×4 IMPLANT
GLOVE SURG POLYISO LF SZ8 (GLOVE) ×1 IMPLANT
GOWN STRL REUS W/ TWL LRG LVL3 (GOWN DISPOSABLE) ×2 IMPLANT
GOWN STRL REUS W/ TWL XL LVL3 (GOWN DISPOSABLE) IMPLANT
GOWN STRL REUS W/TWL 2XL LVL3 (GOWN DISPOSABLE) IMPLANT
GOWN STRL REUS W/TWL LRG LVL3 (GOWN DISPOSABLE) ×2
GOWN STRL REUS W/TWL XL LVL3 (GOWN DISPOSABLE) ×3
KIT BASIN OR (CUSTOM PROCEDURE TRAY) ×2 IMPLANT
KIT POSITION SURG JACKSON T1 (MISCELLANEOUS) ×2 IMPLANT
KIT TURNOVER KIT B (KITS) ×2 IMPLANT
MILL MEDIUM DISP (BLADE) IMPLANT
NDL HYPO 25X1 1.5 SAFETY (NEEDLE) ×1 IMPLANT
NDL SPNL 18GX3.5 QUINCKE PK (NEEDLE) IMPLANT
NEEDLE HYPO 25X1 1.5 SAFETY (NEEDLE) ×2 IMPLANT
NEEDLE SPNL 18GX3.5 QUINCKE PK (NEEDLE) IMPLANT
NS IRRIG 1000ML POUR BTL (IV SOLUTION) ×2 IMPLANT
OIL CARTRIDGE MAESTRO DRILL (MISCELLANEOUS) ×2
PACK LAMINECTOMY NEURO (CUSTOM PROCEDURE TRAY) ×2 IMPLANT
PAD ARMBOARD 7.5X6 YLW CONV (MISCELLANEOUS) ×6 IMPLANT
ROD RELINE COCR LORD 5.0X25 (Rod) ×2 IMPLANT
SCREW LOCK RSS 4.5/5.0MM (Screw) ×4 IMPLANT
SCREW SHANK RELINE MOD 5.5X30 (Screw) ×2 IMPLANT
SCREW SHANK RELINE MOD 5.5X35 (Screw) ×2 IMPLANT
SPONGE SURGIFOAM ABS GEL 100 (HEMOSTASIS) ×2 IMPLANT
SPONGE T-LAP 4X18 ~~LOC~~+RFID (SPONGE) ×1 IMPLANT
STRIP CLOSURE SKIN 1/2X4 (GAUZE/BANDAGES/DRESSINGS) IMPLANT
SUT PROLENE 6 0 BV (SUTURE) IMPLANT
SUT VIC AB 0 CT1 18XCR BRD8 (SUTURE) ×1 IMPLANT
SUT VIC AB 0 CT1 8-18 (SUTURE) ×1
SUT VIC AB 2-0 CT1 18 (SUTURE) ×2 IMPLANT
SUT VIC AB 3-0 SH 8-18 (SUTURE) ×2 IMPLANT
TOWEL GREEN STERILE (TOWEL DISPOSABLE) ×2 IMPLANT
TOWEL GREEN STERILE FF (TOWEL DISPOSABLE) ×2 IMPLANT
TRAY FOLEY MTR SLVR 16FR STAT (SET/KITS/TRAYS/PACK) ×2 IMPLANT
WATER STERILE IRR 1000ML POUR (IV SOLUTION) ×2 IMPLANT

## 2021-05-03 NOTE — Anesthesia Preprocedure Evaluation (Signed)
Anesthesia Evaluation  ?Patient identified by MRN, date of birth, ID band ?Patient awake ? ? ? ?Reviewed: ?Allergy & Precautions, H&P , NPO status , Patient's Chart, lab work & pertinent test results ? ?Airway ?Mallampati: II ? ? ?Neck ROM: full ? ? ? Dental ?  ?Pulmonary ?asthma , COPD, Current Smoker and Patient abstained from smoking.,  ?  ?breath sounds clear to auscultation ? ? ? ? ? ? Cardiovascular ?hypertension,  ?Rhythm:regular Rate:Normal ? ? ?  ?Neuro/Psych ?PSYCHIATRIC DISORDERS Anxiety Depression  Neuromuscular disease   ? GI/Hepatic ?PUD,   ?Endo/Other  ? ? Renal/GU ?  ? ?  ?Musculoskeletal ? ?(+) Arthritis ,  ? Abdominal ?  ?Peds ? Hematology ?  ?Anesthesia Other Findings ? ? Reproductive/Obstetrics ? ?  ? ? ? ? ? ? ? ? ? ? ? ? ? ?  ?  ? ? ? ? ? ? ? ? ?Anesthesia Physical ?Anesthesia Plan ? ?ASA: 3 ? ?Anesthesia Plan: General  ? ?Post-op Pain Management:   ? ?Induction: Intravenous ? ?PONV Risk Score and Plan: 2 and Ondansetron, Dexamethasone, Midazolam and Treatment may vary due to age or medical condition ? ?Airway Management Planned: Oral ETT ? ?Additional Equipment:  ? ?Intra-op Plan:  ? ?Post-operative Plan: Extubation in OR ? ?Informed Consent: I have reviewed the patients History and Physical, chart, labs and discussed the procedure including the risks, benefits and alternatives for the proposed anesthesia with the patient or authorized representative who has indicated his/her understanding and acceptance.  ? ? ? ?Dental advisory given ? ?Plan Discussed with: CRNA, Anesthesiologist and Surgeon ? ?Anesthesia Plan Comments:   ? ? ? ? ? ? ?Anesthesia Quick Evaluation ? ?

## 2021-05-03 NOTE — Anesthesia Postprocedure Evaluation (Signed)
Anesthesia Post Note ? ?Patient: [facility] ? ?Procedure(s) Performed: Lumbar four-five Posterior Lumbar Interbody Fusion (Spine Lumbar) ? ?  ? ?Patient location during evaluation: PACU ?Anesthesia Type: General ?Level of consciousness: awake and alert ?Pain management: pain level controlled ?Vital Signs Assessment: post-procedure vital signs reviewed and stable ?Respiratory status: spontaneous breathing, nonlabored ventilation, respiratory function stable and patient connected to nasal cannula oxygen ?Cardiovascular status: blood pressure returned to baseline and stable ?Postop Assessment: no apparent nausea or vomiting ?Anesthetic complications: no ? ? ?No notable events documented. ? ?Last Vitals:  ?Vitals:  ? 05/03/21 1542 05/03/21 1602  ?BP: 130/69 (!) 151/74  ?Pulse: (!) 54 63  ?Resp: 18 18  ?Temp:    ?SpO2: 100% 94%  ?  ?Last Pain:  ?Vitals:  ? 05/03/21 1456  ?TempSrc:   ?PainSc: 0-No pain  ? ? ?  ?  ?  ?  ?  ?  ? ?[place] ? ? ? ? ?

## 2021-05-03 NOTE — Op Note (Signed)
05/03/2021 ? ?1:16 PM ? ?PATIENT:  [facility]  66 y.o. 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 ?POST-OPERATIVE DIAGNOSIS:  Spondylolisthesis, Lumbar region ? ?PROCEDURE:  Procedure(s): ?Lumbar four-five Posterior Lumbar Interbody Fusion(stryker expandable cages filled with autograft morsels ?Laminectomy L4 in excess of needed exposure for a plif ?Non segmental pedicle screw fixation Nuvasive L4/5 ? ?SURGEON:  Surgeon(s): ?Coletta Memos, MD ?Donalee Citrin, MD ? ?ASSISTANTS:Cram, Jillyn Hidden ? ?ANESTHESIA:   general ? ?EBL:  Total I/O ?In: 1800 [I.V.:1700; IV Piggyback:100] ?Out: 850 [Urine:600; Blood:250] ? ?BLOOD ADMINISTERED:none ? ?CELL SAVER GIVEN:none ? ?COUNT:per nursing ? ?DRAINS: none  ? ?SPECIMEN:  No Specimen ? ?DICTATION: SAHER DAVEE is a 66 y.o. female whom was taken to the operating room intubated, and placed under a general anesthetic without difficulty. A foley catheter was placed under sterile conditions. She was positioned prone on a Jackson table with all pressure points properly padded.  Her lumbar region was prepped and draped in a sterile manner. I infiltrated 20cc's 1/2%lidocaine/1:2000,000 strength epinephrine into the planned incision. I opened the skin with a 10 blade and took the incision down to the thoracolumbar fascia. I exposed the lamina of L3,4,and 5 in a subperiosteal fashion bilaterally. I confirmed my location with an intraoperative xray.  I placed self retaining retractors and started the decompression.  ?I decompressed the spinal canal via inferior facetectomies of L4, and partial superior facetectomies of L5. I performed a complete laminectomy of L4, in excess of the needed exposure for a PLIF. I decompressed the spinal canal, the L4 and L5 roots in the lateral recess and foramina ?PLIF's were performed at L4/5 in the same fashion. I opened the disc space with a 15  blade then used a variety of instruments to remove the disc and prepare the space for the arthrodesis. I used curettes, rongeurs, punches, shavers for the disc space, and rasps in the discetomy. I measured the disc space and placed expandable titanium(Stryker, [place]) cages into the disc space(s). The cages were packed with auto graft morsels ? ?I placed pedicle screws at L4, and L5, using fluoroscopic guidance. I drilled a pilot hole, then cannulated the pedicle with a drill at each site. I then tapped each pedicle, assessing each site for pedicle violations. No cutouts were appreciated. Screws (Nuvasive relign) were then placed at each site without difficulty. I attached rods and locking caps with the appropriate tools. The locking caps were secured with torque limited screwdrivers. Final films were performed and the final construct appeared to be in good position.  ?We closed the wound in a layered fashion. We approximated the thoracolumbar fascia, subcutaneous, and subcuticular planes with vicryl sutures. I used dermabond, and an occlusive bandage for a sterile dressing.  ? ? ? ?PLAN OF CARE: Admit to inpatient  ? ?PATIENT DISPOSITION:  PACU - hemodynamically stable. ?  ?Delay start of Pharmacological VTE agent (>24hrs) due to surgical blood loss or risk of bleeding:  yes ? ?  ?

## 2021-05-03 NOTE — TOC Progression Note (Signed)
Transition of Care (TOC) - Progression Note  ? ? ?Patient Details  ?Name: CARNELLA FRYMAN ?MRN: [id number] ?Date of Birth: 21-May-1955 ? ?Transition of Care (TOC) CM/SW Contact  ?Kingsley Plan, RN ?Phone Number: ?05/03/2021, 4:34 PM ? ?Clinical Narrative:    ? ?DR Cabbell's office has arranged home health services with [facility]. ? ?[facility] staff will provide needed DME  ? ? ?Transition of Care (TOC) Screening Note ? ? ?Patient Details  ?Name: LAVEAH GLOSTER ?Date of Birth: [birth year] ? ? ? ? ?Transition of Care Department [facility]) has reviewed patient and no TOC needs have been identified at this time. We will continue to monitor patient advancement through interdisciplinary progression rounds. If new patient transition needs arise, please place a TOC consult. ?  ? ?  ?  ? ?Expected Discharge Plan and Services ?  ?  ?  ?  ?  ?                ?  ?  ?  ?  ?  ?  ?  ?  ?  ?  ? ? ?Social Determinants of Health (SDOH) Interventions ?  ? ?Readmission Risk Interventions ?No flowsheet data found. ? ?

## 2021-05-03 NOTE — Transfer of Care (Signed)
Immediate Anesthesia Transfer of Care Note ? ?Patient: [facility] ? ?Procedure(s) Performed: Lumbar four-five Posterior Lumbar Interbody Fusion (Spine Lumbar) ? ?Patient Location: PACU ? ?Anesthesia Type:General ? ?Level of Consciousness: drowsy ? ?Airway & Oxygen Therapy: Patient Spontanous Breathing and Patient connected to face mask oxygen ? ?Post-op Assessment: Report given to RN and Post -op Vital signs reviewed and stable ? ?Post vital signs: Reviewed and stable ? ?Last Vitals:  ?Vitals Value Taken Time  ?BP 124/64 05/03/21 1256  ?Temp    ?Pulse 74 05/03/21 1259  ?Resp 17 05/03/21 1259  ?SpO2 100 % 05/03/21 1259  ?Vitals shown include unvalidated device data. ? ?Last Pain:  ?Vitals:  ? 05/03/21 0707  ?TempSrc:   ?PainSc: 6   ?   ? ?Patients Stated Pain Goal: 0 (05/03/21 0707) ? ?Complications: No notable events documented. ?

## 2021-05-03 NOTE — Anesthesia Procedure Notes (Signed)
Procedure Name: Intubation ?Date/Time: 05/03/2021 9:08 AM ?Performed by: Lieutenant Diego, CRNA ?Pre-anesthesia Checklist: Patient identified, Emergency Drugs available, Suction available and Patient being monitored ?Patient Re-evaluated:Patient Re-evaluated prior to induction ?Oxygen Delivery Method: Circle system utilized ?Preoxygenation: Pre-oxygenation with 100% oxygen ?Induction Type: IV induction ?Ventilation: Mask ventilation without difficulty ?Laryngoscope Size: Sabra Heck and 2 ?Grade View: Grade I ?Tube type: Oral ?Tube size: 7.0 mm ?Number of attempts: 1 ?Airway Equipment and Method: Stylet ?Placement Confirmation: ETT inserted through vocal cords under direct vision, positive ETCO2 and breath sounds checked- equal and bilateral ?Secured at: 22 cm ?Tube secured with: Tape ?Dental Injury: Teeth and Oropharynx as per pre-operative assessment  ?Comments: Poor dentition. Existing teeth intact after intubation. ? ? ? ? ?

## 2021-05-03 NOTE — H&P (Signed)
?  BP (!) 158/64   Pulse 65   Temp 97.8 ?F (36.6 ?C) (Oral)   Resp 18   Ht 4\' 9"  (1.448 m)   Wt 54.4 kg   SpO2 97%   BMI 25.97 kg/m?  ?Cassandra Montoya comes in today with severe pain in the back and lower extremities.  She has had this pain since June, worse when she stands or walks.  She has had injections to the lumbar region without relief.  She has continued pain.  She denies any bowel or bladder dysfunction.   ? ?  ? ?Cassandra Montoya's vital signs are as follows:  Height 57 inches, weight 131 pounds, BMI is 28.35, blood pressure 154/84, pulse 81, respiratory rate is 20.  Temperature is 96.3.   ? ?  ? ?She currently is taking Hydrocodone 7.5/325.   ? ?  ? ?She has no known drug allergies. ? ?  ? ?On examination, she is alert, oriented by 4.  She answers all questions appropriately.  Memory, language, attention span, and fund of knowledge are normal speech is clear, it is also fluent. Hearing intact to voice.  Uvula elevates to midline.  Shoulder shrug is normal.  She has 5/5 strength in the upper and lower extremities.  Reflexes 1+ at the knees, not elicited at the ankles.  She has a normal strength, 5/5 in the upper extremities.  She has a negative Romberg.  Her muscle tone, bulk, coordination are also normal.  Hearing intact.  Skin is normal. ? ?  ? ?MRI and plain x-rays are reviewed.  MRI shows a grade 1 almost grade 2 spondylolisthesis.  Severe facet arthropathy at L4-5 and severe spinal stenosis secondary to a ligamentum flavum hypertrophy and facet hypertrophy.  The rest of the spine, she has some mild stenosis present at L3-4.  The conus is normal, cauda equina is normal.  There may be some intra dural cyst, but that is not causing anything, that is right around 3-4. ? ?  ? ?Cassandra Montoya is certainly having the same problems one would expect with neurogenic claudication.  Anatomically, she has more than enough stenosis present to cause this.  She is in misery and looks miserable.  She would like to  proceed with this sometime next week.  I did explain that it would be a posterior lumbar interbody arthrodesis and that it would involve pedicle screws.  I expect her to be in hospital anywhere from 2 to 4 days.  She understands and wishes to proceed. ?

## 2021-05-03 NOTE — Progress Notes (Signed)
Orthopedic Tech Progress Note ?Patient Details:  ?Cassandra Montoya ?[birth year] ?[id number] ? ?PACU RN called requesting a LSO BACK BRACE ? ?Ortho Devices ?Type of Ortho Device: Lumbar corsett ?Ortho Device/Splint Location: BACK ?Ortho Device/Splint Interventions: Ordered ?  ?Post Interventions ?Patient Tolerated: Well ?Instructions Provided: Care of device ? ?Cassandra Montoya ?05/03/2021, 1:40 PM ? ?

## 2021-05-04 DIAGNOSIS — M4316 Spondylolisthesis, lumbar region: Secondary | ICD-10-CM | POA: Diagnosis not present

## 2021-05-04 MED ORDER — HYDROCODONE-ACETAMINOPHEN 5-325 MG PO TABS: 1.0000 | ORAL_TABLET | ORAL | 0 refills | Status: AC | PRN

## 2021-05-04 MED ORDER — TIZANIDINE HCL 2 MG PO TABS: 2.0000 mg | ORAL_TABLET | Freq: Four times a day (QID) | ORAL | 0 refills | Status: DC | PRN

## 2021-05-04 MED FILL — Thrombin For Soln Kit 20000 Unit: CUTANEOUS | Qty: 1 | Status: AC

## 2021-05-04 NOTE — Discharge Summary (Signed)
Physician Discharge Summary  ?Patient ID: ?Cassandra Montoya ?MRN: [id number] ?DOB/AGE: March 14, 1955 66 y.o. ? ?Admit date: 05/03/2021 ?Discharge date: 05/04/2021 ? ?Admission Diagnoses:  Spondylolisthesis, Lumbar region ? ? ?Discharge Diagnoses: same ? ? ?Discharged Condition: good ? ?Hospital Course: The patient was admitted on 05/03/2021 and taken to the operating room where the patient underwent PLIF L4-5. The patient tolerated the procedure well and was taken to the recovery room and then to the floor in stable condition. The hospital course was routine. There were no complications. The wound remained clean dry and intact. Pt had appropriate back soreness. No complaints of leg pain or new N/T/W. The patient remained afebrile with stable vital signs, and tolerated a regular diet. The patient continued to increase activities, and pain was well controlled with oral pain medications.  ? ?Consults: None ? ?Significant Diagnostic Studies:  ?Results for orders placed or performed during the hospital encounter of 05/03/21  ?CBC  ?Result Value Ref Range  ? WBC 5.9 4.0 - 10.5 K/uL  ? RBC 3.97 3.87 - 5.11 MIL/uL  ? Hemoglobin 12.7 12.0 - 15.0 g/dL  ? HCT 39.1 36.0 - 46.0 %  ? MCV 98.5 80.0 - 100.0 fL  ? MCH 32.0 26.0 - 34.0 pg  ? MCHC 32.5 30.0 - 36.0 g/dL  ? RDW 13.8 11.5 - 15.5 %  ? Platelets 302 150 - 400 K/uL  ? nRBC 0.0 0.0 - 0.2 %  ?Creatinine, serum  ?Result Value Ref Range  ? Creatinine, Ser 0.47 0.44 - 1.00 mg/dL  ? GFR, Estimated >60 >60 mL/min  ?ABO/Rh  ?Result Value Ref Range  ? ABO/RH(D)    ?  O POS ?Performed at [facility], [address], [place], [place] [address] ?  ? ? ?DG Lumbar Spine 2-3 Views ? ?Result Date: 05/03/2021 ?CLINICAL DATA:  L4-L5 posterior fusion EXAM: LUMBAR SPINE - 3 VIEW COMPARISON:  Radiograph dated December 27, 2020 FINDINGS: Fluoroscopic images were obtained intraoperatively and submitted for post operative interpretation. L4-L5 posterior fusion with hardware in expected position, 2  images were obtained with 118.3 seconds of fluoroscopy time and 38.11 mGy. Please see the performing provider's procedural report for further detail. IMPRESSION: As above. Electronically Signed   By: Allegra Lai M.D.   On: 05/03/2021 12:46  ? ?DG C-Arm 1-60 Min-No Report ? ?Result Date: 05/03/2021 ?Fluoroscopy was utilized by the requesting physician.  No radiographic interpretation.  ? ?DG C-Arm 1-60 Min-No Report ? ?Result Date: 05/03/2021 ?Fluoroscopy was utilized by the requesting physician.  No radiographic interpretation.  ? ?DG C-Arm 1-60 Min-No Report ? ?Result Date: 05/03/2021 ?Fluoroscopy was utilized by the requesting physician.  No radiographic interpretation.   ? ?Antibiotics:  ?Anti-infectives (From admission, onward)  ? ? Start     Dose/Rate Route Frequency Ordered Stop  ? 05/03/21 0800  vancomycin (VANCOCIN) IVPB 1000 mg/200 mL premix       ? 1,000 mg ?200 mL/hr over 60 Minutes Intravenous  Once 05/03/21 0648 05/03/21 0901  ? 05/03/21 0700  ceFAZolin (ANCEF) IVPB 2g/100 mL premix       ? 2 g ?200 mL/hr over 30 Minutes Intravenous On call to O.R. 05/03/21 [id number] 05/03/21 0950  ? ?  ? ? ?Discharge Exam: ?Blood pressure (!) 102/53, pulse (!) 59, temperature 97.9 ?F (36.6 ?C), temperature source Oral, resp. rate 18, height 4\' 9"  (1.448 m), weight 54.4 kg, SpO2 95 %. ?Neurologic: Grossly normal ?Ambulating and voiding well incision cdi  ? ?Discharge Medications:   ?  Allergies as of 05/04/2021   ?No Known Allergies ?  ? ?  ?Medication List  ?  ? ?STOP taking these medications   ? ?aspirin EC 325 MG tablet ?  ?BC HEADACHE POWDER PO ?  ?feeding supplement Liqd ?  ?HYDROcodone-acetaminophen 7.5-325 MG tablet ?Commonly known as: Norco ?Replaced by: HYDROcodone-acetaminophen 5-325 MG tablet ?  ?lidocaine 5 % ?Commonly known as: Lidoderm ?  ?naproxen 500 MG tablet ?Commonly known as: NAPROSYN ?  ?pantoprazole 20 MG tablet ?Commonly known as: PROTONIX ?  ? ?  ? ?TAKE these medications   ? ?albuterol 108 (90 Base)  MCG/ACT inhaler ?Commonly known as: VENTOLIN HFA ?Inhale 1-2 puffs into the lungs every 6 (six) hours as needed for wheezing or shortness of breath. ?  ?albuterol (2.5 MG/3ML) 0.083% nebulizer solution ?Commonly known as: PROVENTIL ?Take 3 mLs (2.5 mg total) by nebulization every 6 (six) hours as needed for wheezing or shortness of breath. ?  ?Fluticasone-Umeclidin-Vilant 100-62.5-25 MCG/INH Aepb ?Inhale 1 puff into the lungs daily. Trelegy ?  ?HYDROcodone-acetaminophen 5-325 MG tablet ?Commonly known as: NORCO/VICODIN ?Take 1 tablet by mouth every 4 (four) hours as needed for moderate pain. ?Replaces: HYDROcodone-acetaminophen 7.5-325 MG tablet ?  ?lisinopril 20 MG tablet ?Commonly known as: ZESTRIL ?Take 1 tablet (20 mg total) by mouth daily. ?What changed:  ?when to take this ?reasons to take this ?  ?tiZANidine 2 MG tablet ?Commonly known as: ZANAFLEX ?Take 1 tablet (2 mg total) by mouth every 6 (six) hours as needed for muscle spasms. ?  ? ?  ? ? ?Disposition: home  ? ?Final Dx:  plif L4-5 ? ?Discharge Instructions   ? ? Call MD for:  difficulty breathing, headache or visual disturbances   Complete by: As directed ?  ? Call MD for:  hives   Complete by: As directed ?  ? Call MD for:  persistant nausea and vomiting   Complete by: As directed ?  ? Call MD for:  redness, tenderness, or signs of infection (pain, swelling, redness, odor or green/yellow discharge around incision site)   Complete by: As directed ?  ? Call MD for:  severe uncontrolled pain   Complete by: As directed ?  ? Call MD for:  temperature >100.4   Complete by: As directed ?  ? Diet - low sodium heart healthy   Complete by: As directed ?  ? Increase activity slowly   Complete by: As directed ?  ? Lifting restrictions   Complete by: As directed ?  ? No lifting more than 8 lbs  ? Remove dressing in 48 hours   Complete by: As directed ?  ? ?  ? ? ? ? ? ?Signed: ?Tiana Loft Cheskel Silverio ?05/04/2021, 8:05 AM ?  ?

## 2021-05-04 NOTE — Plan of Care (Signed)

## 2021-05-04 NOTE — Progress Notes (Signed)
Patient awaiting transport via wheelchair by volunteer for discharge home; in no acute distress nor complaints of pain nor discomfort; incision on her back with honeycomb dressing and is clean, dry and intact; room was checked for her belongings; discharge instructions concerning her medications, incision care; follow up appointment and when to call the doctor as needed were discussed with patient by RN and  she verbalized understanding on the instructions given. ?

## 2021-05-04 NOTE — Evaluation (Signed)
Occupational Therapy Evaluation ?Patient Details ?Name: Cassandra Montoya ?MRN: [id number] ?DOB: 05/24/1955 ?Today's Date: 05/04/2021 ? ? ?History of Present Illness 66 y.o. F admitted on 05/03/21 for PLIF L4-5. PMH significant for HTN, COPD.  ? ?Clinical Impression ?  ?Pt admitted for procedure listed above. 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 At this time, pt is limited by mild pain, impacting her balance. Pt is able to complete BADL's with no difficulties and using a RW for functional mobility to assist with steadying. OT provided education on compensatory strategies and spinal precautions. Pt has no further OT needs at this time and acute OT will sign off.  ?   ? ?Recommendations for follow up therapy are one component of a multi-disciplinary discharge planning process, led by the attending physician.  Recommendations may be updated based on patient status, additional functional criteria and insurance authorization.  ? ?Follow Up Recommendations ? No OT follow up  ?  ?Assistance Recommended at Discharge PRN  ?Patient can return home with the following A little help with bathing/dressing/bathroom;Assistance with cooking/housework ? ?  ?Functional Status Assessment ? Patient has had a recent decline in their functional status and demonstrates the ability to make significant improvements in function in a reasonable and predictable amount of time.  ?Equipment Recommendations ? BSC/3in1;Other (comment) (RW)  ?  ?Recommendations for Other Services   ? ? ?  ?Precautions / Restrictions Precautions ?Precautions: Cervical ?Precaution Booklet Issued: No ?Restrictions ?Weight Bearing Restrictions: No  ? ?  ? ?Mobility Bed Mobility ?Overal bed mobility: Modified Independent ?  ?  ?  ?  ?  ?  ?General bed mobility comments: Pt completed log roll technique with no difficulties or assist needed ?  ? ?Transfers ?Overall transfer level: Needs assistance ?Equipment  used: None ?Transfers: Sit to/from Stand ?Sit to Stand: Supervision ?  ?  ?  ?  ?  ?General transfer comment: Increased time, sup for safety ?  ? ?  ?Balance Overall balance assessment: Mild deficits observed, not formally tested ?  ?  ?  ?  ?  ?  ?  ?  ?  ?  ?  ?  ?  ?  ?  ?  ?  ?  ?   ? ?ADL either performed or assessed with clinical judgement  ? ?ADL Overall ADL's : Modified independent ?  ?  ?  ?  ?  ?  ?  ?  ?  ?  ?  ?  ?  ?  ?  ?  ?  ?  ?  ?General ADL Comments: Pt able to complete BADL's with no assist and functional mobilitywith a RW  ? ? ? ?Vision Baseline Vision/History: 0 No visual deficits ?Ability to See in Adequate Light: 0 Adequate ?Patient Visual Report: No change from baseline ?Vision Assessment?: No apparent visual deficits  ?   ?Perception   ?  ?Praxis   ?  ? ?Pertinent Vitals/Pain    ? ? ? ?Hand Dominance Right ?  ?Extremity/Trunk Assessment Upper Extremity Assessment ?Upper Extremity Assessment: Overall WFL for tasks assessed ?  ?Lower Extremity Assessment ?Lower Extremity Assessment: Defer to PT evaluation ?  ?Cervical / Trunk Assessment ?Cervical / Trunk Assessment: Normal ?  ?Communication Communication ?Communication: No difficulties ?  ?Cognition Arousal/Alertness: Awake/alert ?Behavior During Therapy: [facility] for tasks assessed/performed ?Overall Cognitive Status: Within Functional Limits for tasks assessed ?  ?  ?  ?  ?  ?  ?  ?  ?  ?  ?  ?  ?  ?  ?  ?  ?  ?  ?  ?  General Comments  VSS on RA, honeycomb dressing intact, no visable drainage. ? ?  ?Exercises   ?  ?Shoulder Instructions    ? ? ?Home Living Family/patient expects to be discharged to:: Private residence ?Living Arrangements: Children ?Available Help at Discharge: Family;Available PRN/intermittently ?Type of Home: House ?Home Access: Stairs to enter ?Entrance Stairs-Number of Steps: 3 steps ?  ?Home Layout: One level ?  ?  ?Bathroom Shower/Tub: Tub/shower unit ?  ?Bathroom Toilet: Standard ?  ?  ?Home Equipment: None ?  ?  ?  ? ?   ?Prior Functioning/Environment Prior Level of Function : Independent/Modified Independent;Working/employed;Driving ?  ?  ?  ?  ?  ?  ?  ?  ?  ? ?  ?  ?OT Problem List: Decreased activity tolerance;Impaired balance (sitting and/or standing);Pain ?  ?   ?OT Treatment/Interventions:    ?  ?OT Goals(Current goals can be found in the care plan section) Acute Rehab OT Goals ?Patient Stated Goal: To go home and rest ?OT Goal Formulation: All assessment and education complete, DC therapy ?Time For Goal Achievement: 05/04/21 ?Potential to Achieve Goals: Good  ?OT Frequency:   ?  ? ?Co-evaluation   ?  ?  ?  ?  ? ?  ?AM-PAC OT "6 Clicks" Daily Activity     ?Outcome Measure Help from another person eating meals?: None ?Help from another person taking care of personal grooming?: None ?Help from another person toileting, which includes using toliet, bedpan, or urinal?: None ?Help from another person bathing (including washing, rinsing, drying)?: None ?Help from another person to put on and taking off regular upper body clothing?: None ?Help from another person to put on and taking off regular lower body clothing?: None ?6 Click Score: 24 ?  ?End of Session Equipment Utilized During Treatment: Back brace ?Nurse Communication: Mobility status ? ?Activity Tolerance: Patient tolerated treatment well ?Patient left: in bed;with call bell/phone within reach ? ?OT Visit Diagnosis: Unsteadiness on feet (R26.81);Other abnormalities of gait and mobility (R26.89);Muscle weakness (generalized) (M62.81)  ?              ?Time: 4401-0272 ?OT Time Calculation (min): 17 min ?Charges:  OT General Charges ?$OT Visit: 1 Visit ?OT Evaluation ?$OT Eval Moderate Complexity: 1 Mod ? ?Aaliyah Cancro H., OTR/L ?Acute Rehabilitation ? ?Kaitlen Redford Elane Bing Plume ?05/04/2021, 10:01 AM ?

## 2021-05-04 NOTE — Evaluation (Signed)
Physical Therapy Evaluation  ?Patient Details ?Name: Cassandra Montoya ?MRN: [id number] ?DOB: 01-21-1956 ?Today's Date: 05/04/2021 ? ?History of Present Illness ? Pt is a 66 y/o F admitted on 05/03/21 for PLIF L4-5. PMH significant for HTN, COPD.  ?Clinical Impression ? Pt admitted with above diagnosis. At the time of PT eval, pt was able to demonstrate transfers and ambulation with gross min guard assist to supervision for safety and RW for support. Pt was educated on precautions, brace application/wearing schedule, appropriate activity progression, and car transfer. Pt currently with functional limitations due to the deficits listed below (see PT Problem List). Pt will benefit from skilled PT to increase their independence and safety with mobility to allow discharge to the venue listed below.     ?   ? ?Recommendations for follow up therapy are one component of a multi-disciplinary discharge planning process, led by the attending physician.  Recommendations may be updated based on patient status, additional functional criteria and insurance authorization. ? ?Follow Up Recommendations No PT follow up ? ?  ?Assistance Recommended at Discharge PRN  ?Patient can return home with the following ? A little help with walking and/or transfers;Assist for transportation;Help with stairs or ramp for entrance ? ?  ?Equipment Recommendations Rolling walker (2 wheels);BSC/3in1  ?Recommendations for Other Services ?    ?  ?Functional Status Assessment Patient has had a recent decline in their functional status and demonstrates the ability to make significant improvements in function in a reasonable and predictable amount of time.  ? ?  ?Precautions / Restrictions Precautions ?Precautions: Cervical ?Precaution Booklet Issued: Yes (comment) ?Precaution Comments: Reviewed handout and pt was cued for precautions during functional mobility. ?Required Braces or Orthoses: Spinal Brace ?Spinal Brace: Lumbar corset;Applied in sitting  position ?Restrictions ?Weight Bearing Restrictions: No  ? ?  ? ?Mobility ? Bed Mobility ?Overal bed mobility: Needs Assistance ?Bed Mobility: Rolling, Sidelying to Sit, Sit to Sidelying ?Rolling: Min guard ?Sidelying to sit: Min guard ?  ?  ?Sit to sidelying: Min guard ?General bed mobility comments: Pt did not require assistance however with poor carryover of precautions and log roll technique. Pt required step-by-step cues and min guard to safely complete bed mobility. ?  ? ?Transfers ?Overall transfer level: Needs assistance ?Equipment used: Rolling walker (2 wheels) ?Transfers: Sit to/from Stand ?Sit to Stand: Supervision ?  ?  ?  ?  ?  ?General transfer comment: Increased time. VC's for hand placement on seated surface for safety. ?  ? ?Ambulation/Gait ?Ambulation/Gait assistance: Min guard, Supervision ?Gait Distance (Feet): 200 Feet ?Assistive device: Rolling walker (2 wheels) ?Gait Pattern/deviations: Step-through pattern, Decreased stride length, Trunk flexed ?Gait velocity: Decreased ?Gait velocity interpretation: <1.31 ft/sec, indicative of household ambulator ?  ?General Gait Details: Slow and mildly unsteady. VC's for improved posture, closer walker proximity, and forward gaze. Progressed to supervision for safety. ? ?Stairs ?Stairs: Yes ?Stairs assistance: Min guard ?Stair Management: One rail Right, Step to pattern, Sideways ?Number of Stairs: 3 ?General stair comments: VC's for sequencing and general safety. Pt was able to complete with frequent cues for posture/maintenance of precautions. ? ?Wheelchair Mobility ?  ? ?Modified Rankin (Stroke Patients Only) ?  ? ?  ? ?Balance Overall balance assessment: Needs assistance ?Sitting-balance support: Feet supported, No upper extremity supported ?Sitting balance-Leahy Scale: Fair ?  ?  ?Standing balance support: During functional activity, Bilateral upper extremity supported ?Standing balance-Leahy Scale: Poor ?  ?  ?  ?  ?  ?  ?  ?  ?  ?  ?  ?  ?    ? ? ? ?  Pertinent Vitals/Pain Pain Assessment ?Pain Assessment: Faces ?Faces Pain Scale: Hurts even more ?Pain Location: low back/incision site ?Pain Descriptors / Indicators: Operative site guarding, Sore ?Pain Intervention(s): Limited activity within patient's tolerance, Monitored during session, Repositioned  ? ? ?Home Living Family/patient expects to be discharged to:: Private residence ?Living Arrangements: Children ?Available Help at Discharge: Family;Available PRN/intermittently ?Type of Home: House ?Home Access: Stairs to enter ?  ?Entrance Stairs-Number of Steps: 3 steps ?  ?Home Layout: One level ?Home Equipment: None ?   ?  ?Prior Function Prior Level of Function : Independent/Modified Independent;Working/employed;Driving ?  ?  ?  ?  ?  ?  ?  ?  ?  ? ? ?Hand Dominance  ? Dominant Hand: Right ? ?  ?Extremity/Trunk Assessment  ? Upper Extremity Assessment ?Upper Extremity Assessment: Defer to OT evaluation ?  ? ?Lower Extremity Assessment ?Lower Extremity Assessment: Generalized weakness ?  ? ?Cervical / Trunk Assessment ?Cervical / Trunk Assessment: Back Surgery  ?Communication  ? Communication: No difficulties  ?Cognition Arousal/Alertness: Awake/alert ?Behavior During Therapy: [facility] for tasks assessed/performed ?Overall Cognitive Status: Within Functional Limits for tasks assessed ?  ?  ?  ?  ?  ?  ?  ?  ?  ?  ?  ?  ?  ?  ?  ?  ?  ?  ?  ? ?  ?General Comments General comments (skin integrity, edema, etc.): VSS on RA, honeycomb dressing intact, no visable drainage. ? ?  ?Exercises    ? ?Assessment/Plan  ?  ?PT Assessment Patient needs continued PT services  ?PT Problem List Decreased strength;Decreased activity tolerance;Decreased balance;Decreased mobility;Decreased knowledge of use of DME;Decreased safety awareness;Decreased knowledge of precautions;Pain ? ?   ?  ?PT Treatment Interventions DME instruction;Gait training;Stair training;Functional mobility training;Therapeutic activities;Therapeutic  exercise;Neuromuscular re-education;Patient/family education   ? ?PT Goals (Current goals can be found in the Care Plan section)  ?Acute Rehab PT Goals ?Patient Stated Goal: Home today, decrease pain ?PT Goal Formulation: With patient ?Time For Goal Achievement: 05/11/21 ?Potential to Achieve Goals: Good ? ?  ?Frequency Min 5X/week ?  ? ? ?Co-evaluation   ?  ?  ?  ?  ? ? ?  ?AM-PAC PT "6 Clicks" Mobility  ?Outcome Measure Help needed turning from your back to your side while in a flat bed without using bedrails?: A Little ?Help needed moving from lying on your back to sitting on the side of a flat bed without using bedrails?: A Little ?Help needed moving to and from a bed to a chair (including a wheelchair)?: A Little ?Help needed standing up from a chair using your arms (e.g., wheelchair or bedside chair)?: A Little ?Help needed to walk in hospital room?: A Little ?Help needed climbing 3-5 steps with a railing? : A Little ?6 Click Score: 18 ? ?  ?End of Session Equipment Utilized During Treatment: Gait belt;Back brace ?Activity Tolerance: Patient tolerated treatment well ?Patient left: in bed;with call bell/phone within reach ?Nurse Communication: Mobility status ?PT Visit Diagnosis: Unsteadiness on feet (R26.81);Pain ?Pain - part of body:  (back) ?  ? ?Time: 8182-9937 ?PT Time Calculation (min) (ACUTE ONLY): 15 min ? ? ?Charges:   PT Evaluation ?$PT Eval Low Complexity: 1 Low ?  ?  ?   ? ? ?Conni Slipper, PT, DPT ?Acute Rehabilitation Services ?Pager: [phone] ?Office: [phone]  ? ?Marylynn Pearson ?05/04/2021, 10:42 AM ? ?

## 2022-09-19 IMAGING — MR MR LUMBAR SPINE W/O CM
5 series · 30 of 48 positions shown · non-contrast
Comparison: X-ray lumbar 12/27/2020; MR lumbar 01/06/2020.

CLINICAL DATA: Chronic low back and bilateral leg pain.

EXAM:
MRI LUMBAR SPINE WITHOUT CONTRAST
TECHNIQUE: Multiplanar, multisequence MR imaging of the lumbar spine was
performed. No intravenous contrast was administered.

[Series 5: T2 · sagittal · 4.0mm · 0.68mm/px · 6 of 15 slices shown (1 of 2)]
[im 1/15]
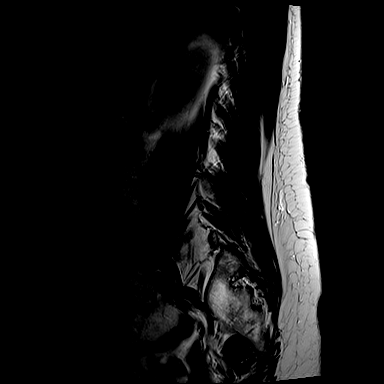
[im 3/15]
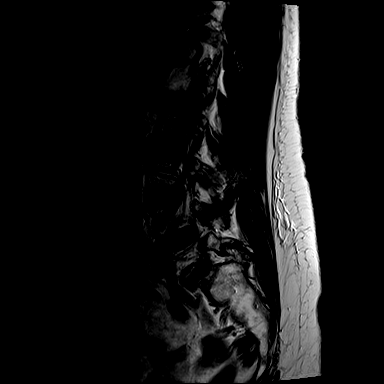
[im 6/15]
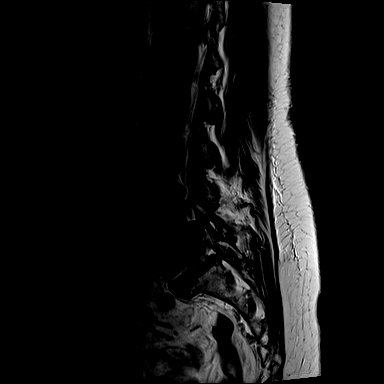
[im 9/15]
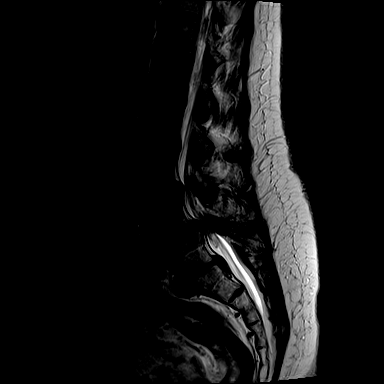
[im 12/15]
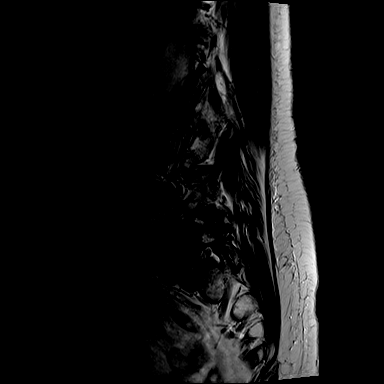
[im 15/15]
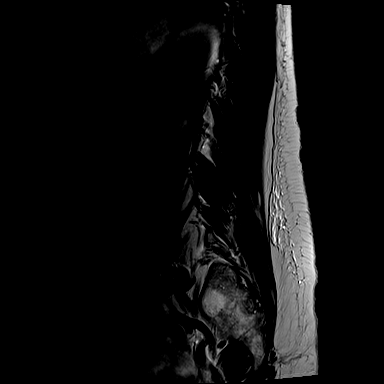

[Series 6: T1 · sagittal · 4.0mm · 0.81mm/px · 7 of 15 slices shown (1 of 2)]
[im 1/15]
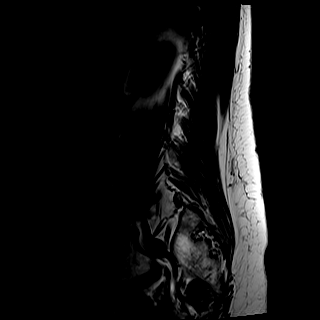
[im 3/15]
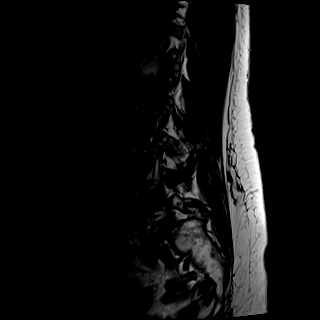
[im 5/15]
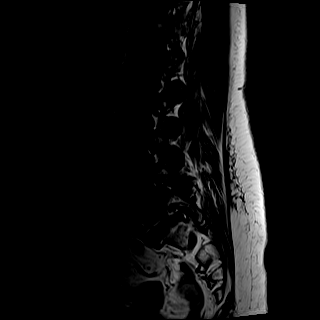
[im 8/15]
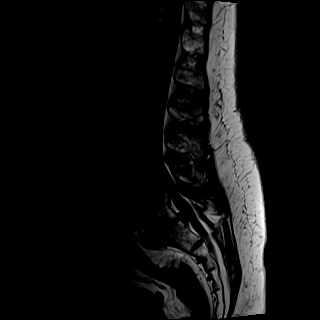
[im 10/15]
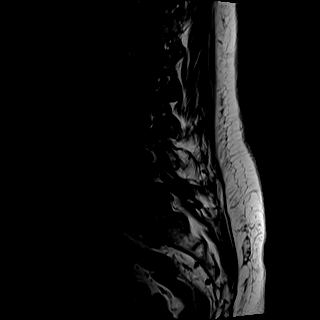
[im 12/15]
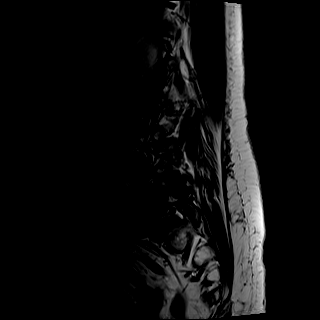
[im 15/15]
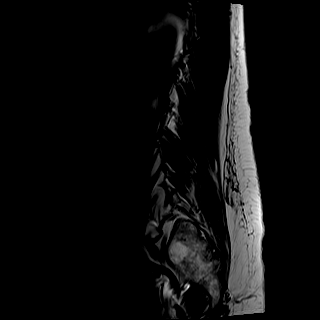

[Series 7: STIR · sagittal · 4.0mm · 0.51mm/px · 1 of 15 slices shown]
[im 1/15]
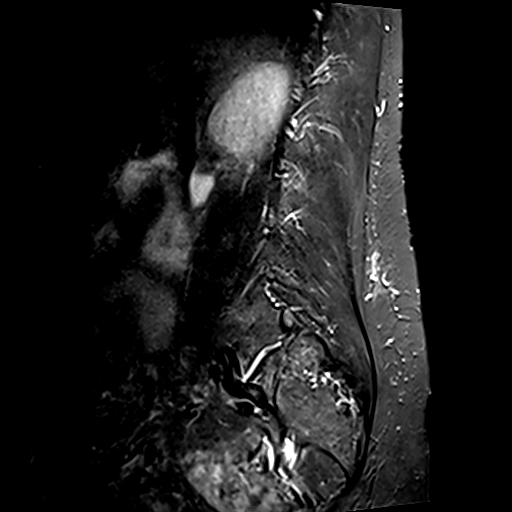

[Series 8: T2 · axial · 4.0mm · 0.70mm/px · z∈[-72,+145]mm · 8 of 32 slices shown (2 of 2)]
[im 1/32]
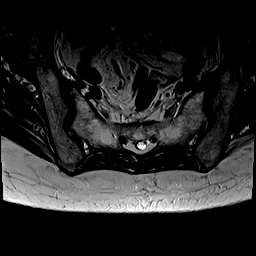
[im 5/32]
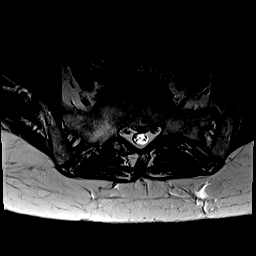
[im 10/32]
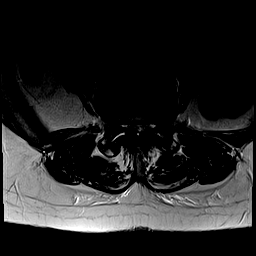
[im 15/32]
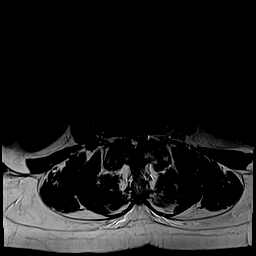
[im 17/32]
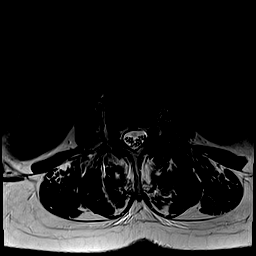
[im 22/32]
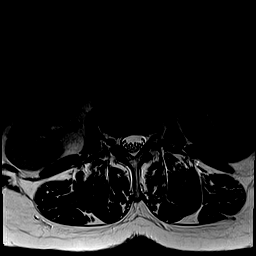
[im 27/32]
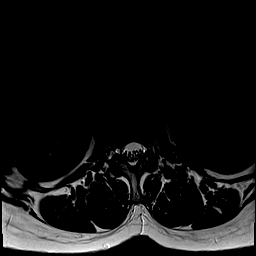
[im 32/32]
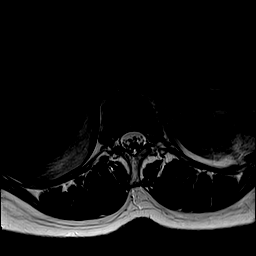

[Series 9: T1 · axial · 4.0mm · 0.35mm/px · z∈[-72,+145]mm · 8 of 32 slices shown (2 of 2)]
[im 1/32]
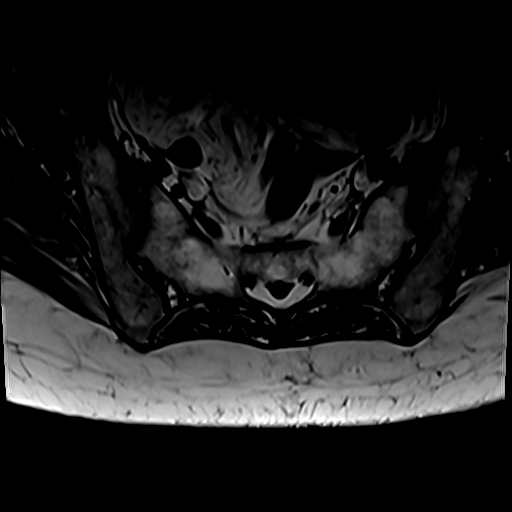
[im 5/32]
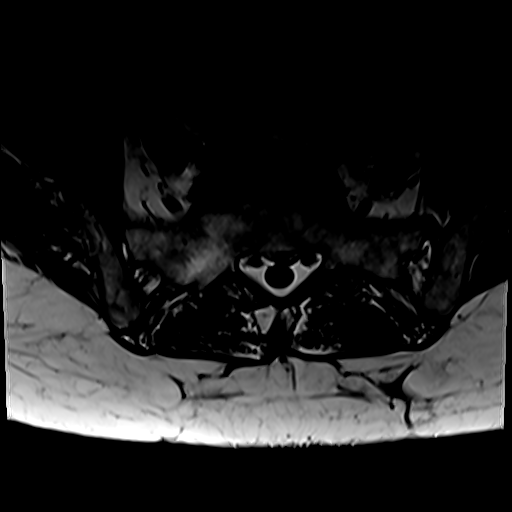
[im 10/32]
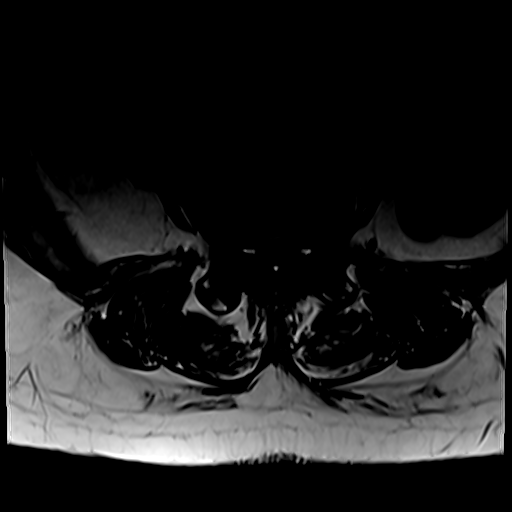
[im 15/32]
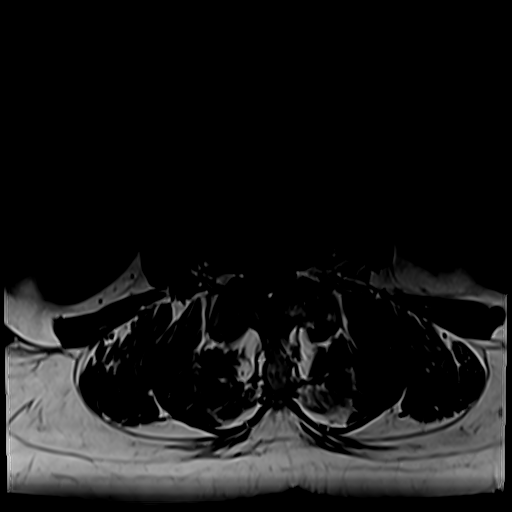
[im 17/32]
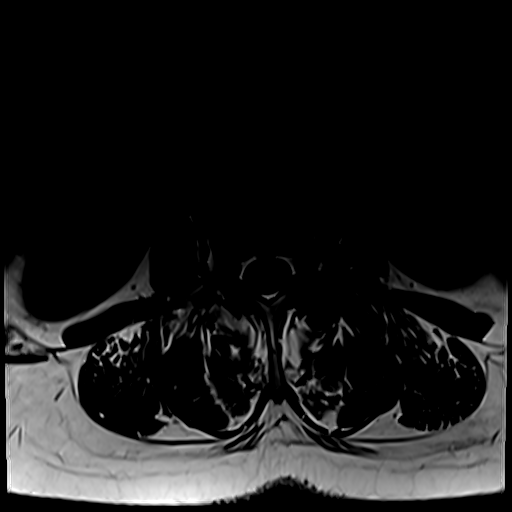
[im 22/32]
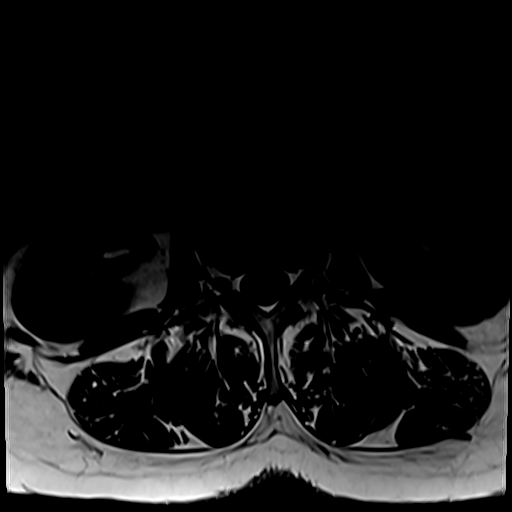
[im 27/32]
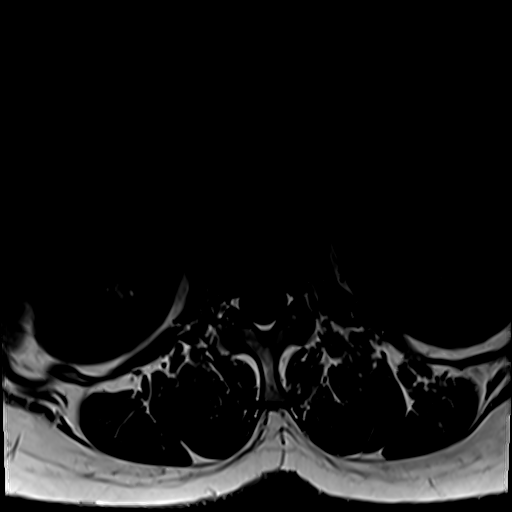
[im 32/32]
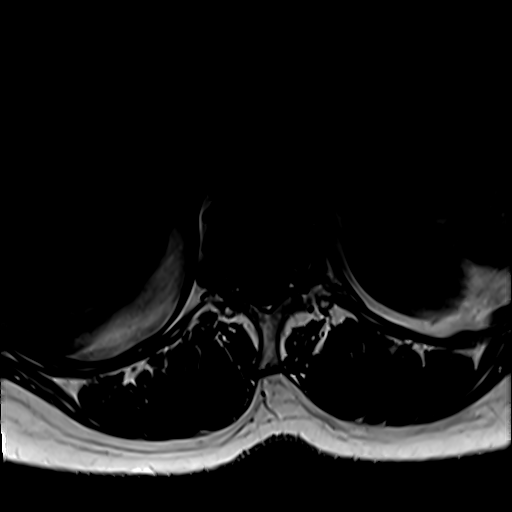

[30 of 48 positions shown; findings below may reference images not displayed]

FINDINGS: Segmentation:  Standard.

Alignment: There is 6 mm grade 1 anterolisthesis L4 on L5. There is
2 mm grade 1 anterolisthesis L3 on L4.

Vertebrae:  No fracture, evidence of discitis, or bone lesion.

Conus medullaris and cauda equina: Conus extends to the T12 level.
Conus and cauda equina appear normal.

Paraspinal and other soft tissues: Negative.

Disc levels:

T12-L1: Mild disc bulge with small left paracentral protrusion.
Minimal facet hypertrophy. No foraminal or canal stenosis.
Unchanged.

L1-L2: Unremarkable disc. Mild facet hypertrophy. No foraminal or
canal stenosis. Unchanged.

L2-L3: Mild diffuse disc bulge. Mild bilateral facet hypertrophy
with slight ligamentum flavum buckling. No foraminal or canal
stenosis. Unchanged.

L3-L4: Anterolisthesis with disc uncovering and diffuse disc bulge.
Mild-moderate bilateral facet arthropathy with ligamentum flavum
buckling. Findings result in mild-to-moderate canal stenosis with
mild bilateral foraminal stenosis. No significant interval
progression from prior.

L4-L5: Anterolisthesis with disc uncovering and diffuse disc bulge.
Severe bilateral facet arthropathy with small bilateral facet joint
effusions. Ligamentum flavum buckling. Findings result in severe
canal stenosis with moderate bilateral foraminal stenosis. Findings
may be slightly progressed compared to prior.

L5-S1: Diffuse disc bulge with small caudally extending central disc
protrusion. Moderate bilateral facet arthropathy, right worse than
left. Disc protrusion results in slight mass effect upon the
bilateral descending S1 nerve roots, slightly more pronounced on the
left. No evidence to suggest neural impingement. No canal stenosis.
Moderate right and mild left foraminal stenosis. Unchanged.
IMPRESSION: 1. Multilevel lumbar spondylosis, as outlined above. Findings are
slightly progressed at the L4-L5 level where there is severe canal
stenosis and moderate bilateral foraminal stenosis.
2. Mild-to-moderate canal stenosis and mild bilateral foraminal
stenosis at L3-L4.
3. Moderate right and mild left foraminal stenosis at L5-S1.
Unchanged small caudally extending central disc protrusion at this
level with slight mass effect upon the bilateral descending S1 nerve
roots, slightly more pronounced on the left.

## 2022-12-12 ENCOUNTER — Emergency Department (HOSPITAL_COMMUNITY): Payer: Medicare Other

## 2022-12-12 ENCOUNTER — Encounter (HOSPITAL_COMMUNITY): Payer: Self-pay | Admitting: Emergency Medicine

## 2022-12-12 ENCOUNTER — Emergency Department (HOSPITAL_COMMUNITY)
Admission: EM | Admit: 2022-12-12 | Discharge: 2022-12-12 | Disposition: A | Discharge status: Home / Self Care | Payer: Medicare Other | Attending: Emergency Medicine | Admitting: Emergency Medicine

## 2022-12-12 ENCOUNTER — Other Ambulatory Visit: Payer: Self-pay

## 2022-12-12 DIAGNOSIS — I10 Essential (primary) hypertension: Secondary | ICD-10-CM | POA: Insufficient documentation

## 2022-12-12 DIAGNOSIS — Z7951 Long term (current) use of inhaled steroids: Secondary | ICD-10-CM | POA: Diagnosis not present

## 2022-12-12 DIAGNOSIS — Z79899 Other long term (current) drug therapy: Secondary | ICD-10-CM | POA: Insufficient documentation

## 2022-12-12 DIAGNOSIS — J3489 Other specified disorders of nose and nasal sinuses: Secondary | ICD-10-CM | POA: Diagnosis not present

## 2022-12-12 DIAGNOSIS — R0602 Shortness of breath: Secondary | ICD-10-CM | POA: Diagnosis present

## 2022-12-12 DIAGNOSIS — J441 Chronic obstructive pulmonary disease with (acute) exacerbation: Secondary | ICD-10-CM

## 2022-12-12 DIAGNOSIS — J449 Chronic obstructive pulmonary disease, unspecified: Secondary | ICD-10-CM | POA: Insufficient documentation

## 2022-12-12 MED ORDER — GUAIFENESIN-CODEINE 100-10 MG/5ML PO SOLN: 10.0000 mL | Freq: Four times a day (QID) | ORAL | 0 refills | Status: AC | PRN

## 2022-12-12 MED ORDER — AZITHROMYCIN 250 MG PO TABS: 250.0000 mg | ORAL_TABLET | Freq: Every day | ORAL | 0 refills | Status: AC

## 2022-12-12 MED ORDER — PREDNISONE 20 MG PO TABS
40.0000 mg | ORAL_TABLET | Freq: Once | ORAL | Status: AC
  Administered 2022-12-12: 40 mg via ORAL
  Filled 2022-12-12: qty 2

## 2022-12-12 MED ORDER — PREDNISONE 10 MG PO TABS: 20.0000 mg | ORAL_TABLET | Freq: Two times a day (BID) | ORAL | 0 refills | Status: DC

## 2022-12-12 MED ORDER — AZITHROMYCIN 250 MG PO TABS
500.0000 mg | ORAL_TABLET | Freq: Once | ORAL | Status: AC
  Administered 2022-12-12: 500 mg via ORAL
  Filled 2022-12-12: qty 2

## 2022-12-12 MED ORDER — IPRATROPIUM-ALBUTEROL 0.5-2.5 (3) MG/3ML IN SOLN
3.0000 mL | Freq: Once | RESPIRATORY_TRACT | Status: AC
  Administered 2022-12-12: 3 mL via RESPIRATORY_TRACT
  Filled 2022-12-12: qty 3

## 2022-12-12 NOTE — ED Notes (Signed)
ED Provider at bedside. 

## 2022-12-12 NOTE — ED Notes (Signed)
Pt states she is feeling better and can breathe easier

## 2022-12-12 NOTE — ED Notes (Signed)
Pt's sister is coming to pick pt up.  Discharge instructions went over with pt. Pt verbalized understanding with no further questions or complaints

## 2022-12-12 NOTE — Discharge Instructions (Signed)
Begin taking Zithromax and prednisone as prescribed.  Begin taking Robitussin with codeine as prescribed as needed for cough.  Continue use of your nebulizer/inhalers as before.  Return to the emergency department if your symptoms significantly worsen or change.

## 2022-12-12 NOTE — ED Triage Notes (Signed)
Pt to ed from home via [facility]. Pt c/o cough x 2 days with productive cough. Pt 93% on RA, EMS placed pt on 2L O2 for comfort with O2 sat 95-96%. Denies any f/n/v/d.

## 2022-12-12 NOTE — ED Notes (Signed)
RT called regarding pt's duoneb

## 2022-12-12 NOTE — ED Provider Notes (Signed)
[place] EMERGENCY DEPARTMENT AT [facility] Provider Note   CSN: [id number] Arrival date & time: 12/12/22  0447     History  Chief Complaint  Patient presents with   Cough    Cassandra Montoya is a 67 y.o. female.  Patient is a 67 year old female with past medical history of COPD, hypertension, tobacco use.  Patient presenting today with complaints of shortness of breath.  She reports a 3-day history of cough productive of yellow sputum, worsening breathing unrelieved with her inhalers and nebulizer machine.  No fevers or chills.  She denies any chest pain.  No ill contacts.  The history is provided by the patient.       Home Medications Prior to Admission medications   Medication Sig Start Date End Date Taking? Authorizing Provider  albuterol (PROVENTIL HFA;VENTOLIN HFA) 108 (90 Base) MCG/ACT inhaler Inhale 1-2 puffs into the lungs every 6 (six) hours as needed for wheezing or shortness of breath.    [provider]  albuterol (PROVENTIL) (2.5 MG/3ML) 0.083% nebulizer solution Take 3 mLs (2.5 mg total) by nebulization every 6 (six) hours as needed for wheezing or shortness of breath. 07/24/20   Triplett, Tammy, PA-C  Fluticasone-Umeclidin-Vilant 100-62.5-25 MCG/INH AEPB Inhale 1 puff into the lungs daily. Trelegy Patient not taking: Reported on 05/01/2021    [provider]  lisinopril (ZESTRIL) 20 MG tablet Take 1 tablet (20 mg total) by mouth daily. Patient taking differently: Take 20 mg by mouth daily as needed (high blood presssure). 10/01/20   Mancel Bale, MD  tiZANidine (ZANAFLEX) 2 MG tablet Take 1 tablet (2 mg total) by mouth every 6 (six) hours as needed for muscle spasms. 05/04/21   Meyran, Tiana Loft, NP      Allergies    Patient has no known allergies.    Review of Systems   Review of Systems  All other systems reviewed and are negative.   Physical Exam Updated Vital Signs BP 103/67   Pulse 75   Temp (!) 97.5 F (36.4 C)  (Axillary)   Resp (!) 23   Ht 4\' 9"  (1.448 m)   Wt 51.3 kg   SpO2 93%   BMI 24.47 kg/m  Physical Exam Vitals and nursing note reviewed.  Constitutional:      General: She is not in acute distress.    Appearance: She is well-developed. She is not diaphoretic.  HENT:     Head: Normocephalic and atraumatic.  Cardiovascular:     Rate and Rhythm: Normal rate and regular rhythm.     Heart sounds: No murmur heard.    No friction rub. No gallop.  Pulmonary:     Effort: Pulmonary effort is normal. No respiratory distress.     Breath sounds: Rhonchi present. No wheezing.     Comments: There are slight rhonchi bilaterally. Abdominal:     General: Bowel sounds are normal. There is no distension.     Palpations: Abdomen is soft.     Tenderness: There is no abdominal tenderness.  Musculoskeletal:        General: Normal range of motion.     Cervical back: Normal range of motion and neck supple.  Skin:    General: Skin is warm and dry.  Neurological:     General: No focal deficit present.     Mental Status: She is alert and oriented to person, place, and time.     ED Results / Procedures / Treatments   Labs (all labs ordered  are listed, but only abnormal results are displayed) Labs Reviewed - No data to display  EKG EKG Interpretation Date/Time:  Wednesday December 12 2022 04:59:10 EDT Ventricular Rate:  85 PR Interval:  128 QRS Duration:  97 QT Interval:  353 QTC Calculation: 420 R Axis:   -17  Text Interpretation: Sinus rhythm Borderline left axis deviation Probable anteroseptal infarct, recent No significant change since 07/24/2020 Confirmed by Geoffery Lyons ([id number]) on 12/12/2022 5:29:40 AM  Radiology No results found.  Procedures Procedures    Medications Ordered in ED Medications  predniSONE (DELTASONE) tablet 40 mg (has no administration in time range)  ipratropium-albuterol (DUONEB) 0.5-2.5 (3) MG/3ML nebulizer solution 3 mL (has no administration in time range)   azithromycin (ZITHROMAX) tablet 500 mg (has no administration in time range)    ED Course/ Medical Decision Making/ A&P  Patient is a 67 year old female with history of COPD presenting with complaints of cough productive of sputum and wheezing for the past 3 days.  Patient arrives with stable vital signs and is afebrile.  Patient with slight expiratory wheezing on exam, but no respiratory distress.  No hypoxia.  Chest x-ray obtained showing no acute process, no infiltrate.  Patient given an albuterol nebulizer treatment with significant relief of her wheezing.  She was also given prednisone and Zithromax.  Patient to follow-up as needed if symptoms worsen or change.  I see no indication for admission.  There is no hypoxia and no respiratory distress.  Final Clinical Impression(s) / ED Diagnoses Final diagnoses:  None    Rx / DC Orders ED Discharge Orders     None         Geoffery Lyons, MD 12/12/22 [phone]

## 2024-01-15 ENCOUNTER — Encounter (INDEPENDENT_AMBULATORY_CARE_PROVIDER_SITE_OTHER): Payer: Self-pay | Admitting: *Deleted

## 2024-01-16 ENCOUNTER — Encounter (HOSPITAL_COMMUNITY): Payer: Self-pay

## 2024-01-16 ENCOUNTER — Emergency Department (HOSPITAL_COMMUNITY)
Admission: EM | Admit: 2024-01-16 | Discharge: 2024-01-16 | Disposition: A | Discharge status: Home / Self Care | Attending: Emergency Medicine | Admitting: Emergency Medicine

## 2024-01-16 DIAGNOSIS — M545 Low back pain, unspecified: Secondary | ICD-10-CM | POA: Diagnosis present

## 2024-01-16 MED ORDER — MELOXICAM 15 MG PO TABS
15.0000 mg | ORAL_TABLET | Freq: Every day | ORAL | 0 refills | Status: AC
Start: 2024-01-16 — End: 2024-01-30

## 2024-01-16 MED ORDER — METHOCARBAMOL 500 MG PO TABS: 500.0000 mg | ORAL_TABLET | Freq: Two times a day (BID) | ORAL | 0 refills | Status: DC | PRN

## 2024-01-16 MED ORDER — DEXAMETHASONE SOD PHOSPHATE PF 10 MG/ML IJ SOLN
10.0000 mg | Freq: Once | INTRAMUSCULAR | Status: AC
  Administered 2024-01-16: 10 mg via INTRAMUSCULAR

## 2024-01-16 NOTE — ED Provider Notes (Signed)
 [place] EMERGENCY DEPARTMENT AT [facility] Provider Note   CSN: [id number] Arrival date & time: 01/16/24  1736     Patient presents with: Back Pain (lower)   Cassandra Montoya is a 68 y.o. female.    Back Pain    This patient is an 68 year old female prior history of an L4-L5 fusion 2 years ago followed by Dr. Gillie but not recently.  States that for the last couple of weeks she has had increasing lower back pain, she works on her feet all day in a kitchen, some difficulty ambulating because of the pain and has pain that radiates down her legs to her feet.  Sometimes she gets numb and tingly but is not having that right now, no urinary symptoms, no cancer, no fevers, she has not been taking any significant medications at home to help.  She has seen a couple of urgent cares but states that none of the medicines have been relieving at all.  Prior to Admission medications   Medication Sig Start Date End Date Taking? Authorizing Provider  meloxicam  (MOBIC ) 15 MG tablet Take 1 tablet (15 mg total) by mouth daily for 14 days. 01/16/24 01/30/24 Yes Cleotilde Rogue, MD  methocarbamol  (ROBAXIN ) 500 MG tablet Take 1 tablet (500 mg total) by mouth 2 (two) times daily as needed for muscle spasms. 01/16/24  Yes Cleotilde Rogue, MD  albuterol  (PROVENTIL  HFA;VENTOLIN  HFA) 108 (90 Base) MCG/ACT inhaler Inhale 1-2 puffs into the lungs every 6 (six) hours as needed for wheezing or shortness of breath.    [provider]  albuterol  (PROVENTIL ) (2.5 MG/3ML) 0.083% nebulizer solution Take 3 mLs (2.5 mg total) by nebulization every 6 (six) hours as needed for wheezing or shortness of breath. 07/24/20   Triplett, Tammy, PA-C  azithromycin  (ZITHROMAX ) 250 MG tablet Take 1 tablet (250 mg total) by mouth daily. 12/12/22   Geroldine Berg, MD  Fluticasone -Umeclidin-Vilant 100-62.5-25 MCG/INH AEPB Inhale 1 puff into the lungs daily. Trelegy Patient not taking: Reported on 05/01/2021    [provider]  guaiFENesin -codeine  100-10 MG/5ML syrup Take 10 mLs by mouth every 6 (six) hours as needed for cough. 12/12/22   Geroldine Berg, MD  lisinopril  (ZESTRIL ) 20 MG tablet Take 1 tablet (20 mg total) by mouth daily. Patient taking differently: Take 20 mg by mouth daily as needed (high blood presssure). 10/01/20   Lorriane Holmes, MD  predniSONE  (DELTASONE ) 10 MG tablet Take 2 tablets (20 mg total) by mouth 2 (two) times daily. 12/12/22   Geroldine Berg, MD  tiZANidine  (ZANAFLEX ) 2 MG tablet Take 1 tablet (2 mg total) by mouth every 6 (six) hours as needed for muscle spasms. 05/04/21   Meyran, Suzen Lacks, NP    Allergies: Patient has no known allergies.    Review of Systems  Musculoskeletal:  Positive for back pain.  All other systems reviewed and are negative.   Updated Vital Signs BP (!) 146/64   Pulse 71   Temp 98.1 F (36.7 C) (Oral)   Resp 18   Ht 1.549 m (5' 1)   Wt 52.2 kg   SpO2 95%   BMI 21.73 kg/m   Physical Exam Vitals and nursing note reviewed.  Constitutional:      General: She is not in acute distress.    Appearance: She is well-developed.  HENT:     Head: Normocephalic and atraumatic.     Mouth/Throat:     Pharynx: No oropharyngeal exudate.  Eyes:  General: No scleral icterus.       Right eye: No discharge.        Left eye: No discharge.     Conjunctiva/sclera: Conjunctivae normal.     Pupils: Pupils are equal, round, and reactive to light.  Neck:     Thyroid : No thyromegaly.     Vascular: No JVD.  Cardiovascular:     Rate and Rhythm: Normal rate and regular rhythm.     Heart sounds: Normal heart sounds. No murmur heard.    No friction rub. No gallop.  Pulmonary:     Effort: Pulmonary effort is normal. No respiratory distress.     Breath sounds: Normal breath sounds. No wheezing or rales.  Abdominal:     General: Bowel sounds are normal. There is no distension.     Palpations: Abdomen is soft. There is no mass.     Tenderness: There  is no abdominal tenderness.  Musculoskeletal:        General: No tenderness. Normal range of motion.     Cervical back: Normal range of motion and neck supple.     Comments: There is no focal tenderness across the lower back or the paraspinal muscles or the spinal processes, the patient is able to ambulate with a slight antalgic gait, she is able to stand on 1 leg on each side.  There is no imbalance and she has totally normal strength and sensation of the bilateral lower extremities.  Lymphadenopathy:     Cervical: No cervical adenopathy.  Skin:    General: Skin is warm and dry.     Findings: No erythema or rash.  Neurological:     Mental Status: She is alert.     Coordination: Coordination normal.  Psychiatric:        Behavior: Behavior normal.     (all labs ordered are listed, but only abnormal results are displayed) Labs Reviewed - No data to display  EKG: None  Radiology: No results found.   Procedures   Medications Ordered in the ED  dexamethasone  (DECADRON ) injection 10 mg (10 mg Intramuscular Given 01/16/24 1823)                                    Medical Decision Making Risk Prescription drug management.   The patient has a totally normal neurologic exam, she is having pain that seems to radiate down to her legs, she can find some positions of comfort.  At this time she will be given a shot of Decadron , home with some pain medication muscle relaxer and follow-up with Dr. Gillie, keep her out of work for few days, patient agreeable.  Patient is acceptable to the plan     Final diagnoses:  Lumbar back pain    ED Discharge Orders          Ordered    methocarbamol  (ROBAXIN ) 500 MG tablet  2 times daily PRN        01/16/24 1827    meloxicam  (MOBIC ) 15 MG tablet  Daily        01/16/24 1827               Cleotilde Rogue, MD 01/16/24 1828

## 2024-01-16 NOTE — ED Triage Notes (Signed)
 Pt has lower back pain for the past 3 weeks. Pt has seen 3 urgent care and 2 doctors for this. Pt was given a pain medication but has helped. Pt is unable to sit in a comfortable position due to pain. A&Ox4.

## 2024-01-16 NOTE — Discharge Instructions (Addendum)
 Please call the office of Dr. Gillie to follow-up this week, I want to keep you out of work for the next several days, you can take Mobic  once a day for pain, Robaxin  as prescribed as a muscle relaxer, do not drive if taking these medications

## 2024-01-19 ENCOUNTER — Emergency Department (HOSPITAL_COMMUNITY)

## 2024-01-19 ENCOUNTER — Other Ambulatory Visit: Payer: Self-pay

## 2024-01-19 ENCOUNTER — Encounter (HOSPITAL_COMMUNITY): Payer: Self-pay

## 2024-01-19 ENCOUNTER — Emergency Department (HOSPITAL_COMMUNITY)
Admission: EM | Admit: 2024-01-19 | Discharge: 2024-01-19 | Disposition: A | Discharge status: Home / Self Care | Attending: Emergency Medicine | Admitting: Emergency Medicine

## 2024-01-19 DIAGNOSIS — M5442 Lumbago with sciatica, left side: Secondary | ICD-10-CM | POA: Diagnosis not present

## 2024-01-19 DIAGNOSIS — M5116 Intervertebral disc disorders with radiculopathy, lumbar region: Secondary | ICD-10-CM

## 2024-01-19 DIAGNOSIS — M5441 Lumbago with sciatica, right side: Secondary | ICD-10-CM | POA: Diagnosis not present

## 2024-01-19 DIAGNOSIS — M545 Low back pain, unspecified: Secondary | ICD-10-CM | POA: Diagnosis present

## 2024-01-19 MED ORDER — LIDOCAINE 5 % EX PTCH
1.0000 | MEDICATED_PATCH | CUTANEOUS | Status: DC
  Administered 2024-01-19: 1 via TRANSDERMAL
  Filled 2024-01-19: qty 1

## 2024-01-19 MED ORDER — DIAZEPAM 5 MG PO TABS
5.0000 mg | ORAL_TABLET | ORAL | Status: AC
  Administered 2024-01-19: 5 mg via ORAL
  Filled 2024-01-19: qty 1

## 2024-01-19 MED ORDER — OXYCODONE-ACETAMINOPHEN 5-325 MG PO TABS: 1.0000 | ORAL_TABLET | Freq: Four times a day (QID) | ORAL | 0 refills | Status: DC | PRN

## 2024-01-19 MED ORDER — OXYCODONE HCL 5 MG PO TABS
5.0000 mg | ORAL_TABLET | ORAL | Status: AC
  Administered 2024-01-19: 5 mg via ORAL
  Filled 2024-01-19: qty 1

## 2024-01-19 MED ORDER — DEXAMETHASONE SOD PHOSPHATE PF 10 MG/ML IJ SOLN
10.0000 mg | Freq: Once | INTRAMUSCULAR | Status: AC
  Administered 2024-01-19: 10 mg via INTRAMUSCULAR

## 2024-01-19 MED ORDER — HYDROMORPHONE HCL 1 MG/ML IJ SOLN
1.0000 mg | Freq: Once | INTRAMUSCULAR | Status: AC
  Administered 2024-01-19: 1 mg via INTRAMUSCULAR
  Filled 2024-01-19: qty 1

## 2024-01-19 MED ORDER — METHOCARBAMOL 500 MG PO TABS: 500.0000 mg | ORAL_TABLET | Freq: Four times a day (QID) | ORAL | 0 refills | Status: DC | PRN

## 2024-01-19 NOTE — ED Triage Notes (Signed)
 Pt c.o severe lower back pain, pt taking tramadol , robaxin  and prednisone  without relief.

## 2024-01-19 NOTE — ED Provider Notes (Signed)
 [place] EMERGENCY DEPARTMENT AT [facility] Provider Note   CSN: [id number] Arrival date & time: 01/19/24  1248     Patient presents with: Back Pain   Cassandra Montoya is a 68 y.o. female.   68 year old female history of L4-L5 spinal fusion 2 years ago presents emergency department back pain.  Patient reports that for the past 3 weeks she has been having back pain.  Initially radiate radiated down her right leg now radiates down her left leg as well.  Worsened with movement.  No known injuries.  No bowel or bladder incontinence.  Says the pain is making it difficult to walk but denies any saddle anesthesia or weakness of one leg more than the other.  No fevers or IV drug use.  No history of cancer.  This is her third visit to the emergency department.  Was seen 3 days ago and was given a dose of Decadron  and a prescription for meloxicam  and methocarbamol .  Also is taking tramadol  at home for it as well       Prior to Admission medications   Medication Sig Start Date End Date Taking? Authorizing Provider  albuterol  (PROVENTIL  HFA;VENTOLIN  HFA) 108 (90 Base) MCG/ACT inhaler Inhale 1-2 puffs into the lungs every 6 (six) hours as needed for wheezing or shortness of breath.    [provider]  albuterol  (PROVENTIL ) (2.5 MG/3ML) 0.083% nebulizer solution Take 3 mLs (2.5 mg total) by nebulization every 6 (six) hours as needed for wheezing or shortness of breath. 07/24/20   Triplett, Tammy, PA-C  azithromycin  (ZITHROMAX ) 250 MG tablet Take 1 tablet (250 mg total) by mouth daily. 12/12/22   Geroldine Berg, MD  Fluticasone -Umeclidin-Vilant 100-62.5-25 MCG/INH AEPB Inhale 1 puff into the lungs daily. Trelegy Patient not taking: Reported on 05/01/2021    [provider]  guaiFENesin -codeine  100-10 MG/5ML syrup Take 10 mLs by mouth every 6 (six) hours as needed for cough. 12/12/22   Geroldine Berg, MD  lisinopril  (ZESTRIL ) 20 MG tablet Take 1 tablet (20 mg total) by mouth  daily. Patient taking differently: Take 20 mg by mouth daily as needed (high blood presssure). 10/01/20   Lorriane Holmes, MD  meloxicam  (MOBIC ) 15 MG tablet Take 1 tablet (15 mg total) by mouth daily for 14 days. 01/16/24 01/30/24  Cleotilde Rogue, MD  methocarbamol  (ROBAXIN ) 500 MG tablet Take 1 tablet (500 mg total) by mouth 2 (two) times daily as needed for muscle spasms. 01/16/24   Cleotilde Rogue, MD  predniSONE  (DELTASONE ) 10 MG tablet Take 2 tablets (20 mg total) by mouth 2 (two) times daily. 12/12/22   Geroldine Berg, MD  tiZANidine  (ZANAFLEX ) 2 MG tablet Take 1 tablet (2 mg total) by mouth every 6 (six) hours as needed for muscle spasms. 05/04/21   Meyran, Suzen Lacks, NP    Allergies: Patient has no known allergies.    Review of Systems  Updated Vital Signs BP (!) 166/82   Pulse 72   Temp 97.7 F (36.5 C)   Resp 15   Ht 5' 1 (1.549 m)   Wt 52.1 kg   SpO2 95%   BMI 21.70 kg/m   Physical Exam Vitals and nursing note reviewed.  Constitutional:      General: She is not in acute distress.    Appearance: She is well-developed.  HENT:     Head: Normocephalic and atraumatic.     Right Ear: External ear normal.     Left Ear: External ear normal.  Nose: Nose normal.  Eyes:     Extraocular Movements: Extraocular movements intact.     Conjunctiva/sclera: Conjunctivae normal.     Pupils: Pupils are equal, round, and reactive to light.  Musculoskeletal:     Right lower leg: No edema.     Left lower leg: No edema.     Comments: No spinal midline TTP in cervical, thoracic, or lumbar spine. No stepoffs noted.   Motor: Muscle bulk and tone are normal. Strength is 4/5 limited 2/2 pain in hip flexion, knee flexion and extension, ankle dorsiflexion and plantar flexion bilaterally. Full strength of great toe dorsiflexion bilaterally.  Sensory: Intact sensation to light touch in L2 though S1 dermatomes bilaterally.   Reflexes: Patellar 2+ bilaterally, Achilles 2+ bilaterally, no  ankle clonus bilaterally  Skin:    General: Skin is warm and dry.  Neurological:     Mental Status: She is alert and oriented to person, place, and time. Mental status is at baseline.  Psychiatric:        Mood and Affect: Mood normal.     (all labs ordered are listed, but only abnormal results are displayed) Labs Reviewed - No data to display  EKG: None  Radiology: No results found.   Procedures   Medications Ordered in the ED  lidocaine  (LIDODERM ) 5 % 1 patch (1 patch Transdermal Patch Applied 01/19/24 1533)  diazepam  (VALIUM ) tablet 5 mg (has no administration in time range)  oxyCODONE  (Oxy IR/ROXICODONE ) immediate release tablet 5 mg (5 mg Oral Given 01/19/24 1533)    Clinical Course as of 01/19/24 1704  Sun Jan 19, 2024  1618 Signed out to Dr Armenta.  [RP]    Clinical Course User Index [RP] Yolande Lamar BROCKS, MD                                 Medical Decision Making Amount and/or Complexity of Data Reviewed Radiology: ordered.  Risk Prescription drug management.   Cassandra Montoya is a 68 year old female history of L4-L5 spinal fusion 2 years ago presents emergency department back pain.  Initial Ddx:  Lumbar radiculopathy, spinal cord compression, cauda equina  MDM/Course:  Patient resents emergency department back pain for 3 weeks.  Radiates down both her legs.  No bowel or bladder incontinence.  Says that she is having some difficulty walking because of this.  This is her third visit for this and has had fairly refractory symptoms despite being on multiple medications.  Her current exam is limited due to pain.  She does have diffusely 4 out of 5 strength in her lower extremities.  Was given some pain medication here in the emergency department and upon re-evaluation still had significant weakness.  MRI was ordered with refractory symptoms of weakness.  Valium  ordered to help facilitate MRI because she is claustrophobic.  This patient presents to the ED for  concern of complaints listed in HPI, this involves an extensive number of treatment options, and is a complaint that carries with it a high risk of complications and morbidity. Disposition including potential need for admission considered.   Dispo: Pending remainder of workup  Additional history obtained from sister Records reviewed ED Visit Notes I have reviewed the patients home medications and made adjustments as needed  Portions of this note were generated with [employer] dictation software. Dictation errors may occur despite best attempts at proofreading.     Final diagnoses:  Acute bilateral low  back pain with bilateral sciatica    ED Discharge Orders     None          Yolande Lamar BROCKS, MD 01/19/24 1704

## 2024-01-19 NOTE — ED Triage Notes (Signed)
 First Nurse Note: Pt having low back pain with radiation down both legs for 3 weeks. Denies any urinary/fecal incontinence or saddle anesthesia. Pt has had multiple Dr. Visits for the same, but the pain remains uncontrolled.

## 2024-01-19 NOTE — Discharge Instructions (Addendum)
 1.  You are given a steroid shot in the emergency department called Decadron .  You do not need to take any more steroid pills at this time.  This will continue to work over the next days to week. 2.  You may continue the muscle relaxer Robaxin .  You may take 1-2 Percocet tablets every 6 hours for severe pain.  Percocet is a narcotic medication that can both be addictive as well as cause constipation.  Try to use sparingly when your pain is improving.  When you are experiencing improvement, you may change to extra strength Tylenol  every 6 hours per package instructions.  Do not take Percocet and Tylenol  together.  To avoid constipation, take over-the-counter Colace or other stool softener twice daily or as per package instructions.  If you are not having a bowel movement at least every 2 to 3 days start taking MiraLAX  daily per package instructions. 3.  You have multiple places in your back where you have discs that are protruding and causing pain on nerves.  You have a lot of arthritic and degenerative changes in the back.  You need to call Dr. Gillie on Monday to schedule your follow-up appointment. 4.  At this time you are experiencing pain at is migrating from 1 side to the other.  Return to the emergency department if you get weakness or numbness that is impairing your gait or your function.  Return if you get bowel or bladder incontinence or inability to go to the bathroom.

## 2024-01-19 NOTE — ED Provider Notes (Signed)
 Patient is third visit for ongoing problems with pain and weakness with history of lumbar spinal fusion approximately 2 years ago.  Patient is getting MRI.  She has been treated for pain and is still having functional disability, appears secondary to pain limitation.  If MRI negative reassess for discharge. Physical Exam  BP (!) 166/82   Pulse 72   Temp 97.6 F (36.4 C)   Resp 15   Ht 5' 1 (1.549 m)   Wt 52.1 kg   SpO2 95%   BMI 21.70 kg/m   Physical Exam  Procedures  Procedures  ED Course / MDM   Clinical Course as of 01/19/24 1621  Sun Jan 19, 2024  1618 Signed out to Dr Armenta.  [RP]    Clinical Course User Index [RP] Yolande Lamar BROCKS, MD   Medical Decision Making Amount and/or Complexity of Data Reviewed Radiology: ordered.  Risk Prescription drug management.   Patient has significantly intractable pain with multiple ER visits.  She reports that she has gotten limited relief from a number of different medication therapies.  She is just finishing an oral steroid taper.  She has her last tablet of Robaxin  right now.  She reports that does seem to help a little bit.  She reports despite the fact the medications do not seem to be helping a lot she does continue to take them.  She reports that the pain has migrated from left to right and this time she has her most severe pain in the left leg.  She is however ambulatory.  Patient can bear weight on both legs and ambulate with antalgic gait.  No focal weakness.  On exam no significant peripheral edema.  Both feet are warm and dry.  Dorsalis pedis pulses are 2+ and symmetric.  MRI is returned and shows multiple levels of disc protrusion, degenerative facet disease and moderate but noncritical stenosis.  At this time, the patient will need to follow-up with neurosurgery again.  She has seen Dr. Cabbell for surgical management 2 years ago.  She does not have emergent impingement type syndrome or neurologic dysfunction.  I do  feel she stable for discharge but will need augmented pain control.  I will give another IM dose of Decadron  in the emergency department for anti-inflammatory coverage, refill Robaxin  and give Percocet 1 to 2 tablets every 6 hours for acute pain control.  Patient understands plan for follow-up and return precautions.  Findings and plan were reviewed with companion at bedside.       Armenta Canning, MD 01/19/24 [phone]

## 2024-01-19 NOTE — ED Notes (Signed)
Ambulatory to bathroom with cane

## 2024-01-30 ENCOUNTER — Other Ambulatory Visit: Payer: Self-pay

## 2024-01-30 ENCOUNTER — Emergency Department (HOSPITAL_COMMUNITY)
Admission: EM | Admit: 2024-01-30 | Discharge: 2024-01-30 | Disposition: A | Discharge status: Home / Self Care | Attending: Emergency Medicine | Admitting: Emergency Medicine

## 2024-01-30 ENCOUNTER — Encounter (HOSPITAL_COMMUNITY): Payer: Self-pay

## 2024-01-30 DIAGNOSIS — M5432 Sciatica, left side: Secondary | ICD-10-CM | POA: Insufficient documentation

## 2024-01-30 DIAGNOSIS — M545 Low back pain, unspecified: Secondary | ICD-10-CM | POA: Diagnosis present

## 2024-01-30 MED ORDER — OXYCODONE-ACETAMINOPHEN 5-325 MG PO TABS: 1.0000 | ORAL_TABLET | Freq: Four times a day (QID) | ORAL | 0 refills | Status: DC | PRN

## 2024-01-30 MED ORDER — OXYCODONE-ACETAMINOPHEN 5-325 MG PO TABS
2.0000 | ORAL_TABLET | Freq: Once | ORAL | Status: AC
  Administered 2024-01-30: 2 via ORAL
  Filled 2024-01-30: qty 2

## 2024-01-30 NOTE — Discharge Instructions (Addendum)
 Opiate medications such as Percocet or Vicodin or morphine  are very strong pain medications that are also very addictive if they are taken for too long, even a single dose can predispose someone to become addicted so be very careful when taking this medication.  You should take the smallest amount possible to relieve your pain, these medications may cause constipation or nausea, please follow-up with your doctor if you are having the need for ongoing pain control despite these medications.  Additionally please be aware that these medications may cause sedation or sleepiness or alter judgment so you should not take this if you are driving a vehicle or operating heavy machinery or taking care of small children.  Call the office of Dr. Gillie to secure your visit on the 15th to make sure that you know the timing or location.  Unfortunately there is no other medications that we can give you today, I have refilled your meloxicam  which you can take once a day and the Percocet which can be taken 1 tablet every 6 hours.  Thank you for allowing us  to treat you in the emergency department today.  After reviewing your examination and potential testing that was done it appears that you are safe to go home.  I would like for you to follow-up with your doctor within the next several days, have them obtain your records and follow-up with them to review all potential tests and results from your visit.  If you should develop severe or worsening symptoms return to the emergency department immediately

## 2024-01-30 NOTE — ED Provider Notes (Signed)
 [place] EMERGENCY DEPARTMENT AT [facility] Provider Note   CSN: [id number] Arrival date & time: 01/30/24  1140     Patient presents with: Back Pain   Cassandra Montoya is a 68 y.o. female.    Back Pain  This patient is a 68 year old female with some chronic back pain presenting with worsening and ongoing pain in her lower back radiating into her left leg.  This is the same thing that I had seen her for on November 27, she had presented on November 30 and had even been seen for an MRI at that visit on the 30th.  She had no acute surgical pathology, she has been given multiple different medications including some Percocet, she has been given muscle relaxers and anti-inflammatories, she reports that none of this helps and she continues to have pain.  This is kept her out of work because it is difficult for her to walk with the pain is radiating down her leg.  She denies numbness or weakness, she has a follow-up with Dr. Gillie on 15 December according to her report    Prior to Admission medications  Medication Sig Start Date End Date Taking? Authorizing Provider  oxyCODONE -acetaminophen  (PERCOCET/ROXICET) 5-325 MG tablet Take 1 tablet by mouth every 6 (six) hours as needed for severe pain (pain score 7-10). 01/30/24  Yes Cleotilde Rogue, MD  albuterol  (PROVENTIL  HFA;VENTOLIN  HFA) 108 (90 Base) MCG/ACT inhaler Inhale 1-2 puffs into the lungs every 6 (six) hours as needed for wheezing or shortness of breath.    [provider]  albuterol  (PROVENTIL ) (2.5 MG/3ML) 0.083% nebulizer solution Take 3 mLs (2.5 mg total) by nebulization every 6 (six) hours as needed for wheezing or shortness of breath. 07/24/20   Triplett, Tammy, PA-C  azithromycin  (ZITHROMAX ) 250 MG tablet Take 1 tablet (250 mg total) by mouth daily. 12/12/22   Geroldine Berg, MD  Fluticasone -Umeclidin-Vilant 100-62.5-25 MCG/INH AEPB Inhale 1 puff into the lungs daily. Trelegy Patient not taking: Reported on 05/01/2021     [provider]  guaiFENesin -codeine  100-10 MG/5ML syrup Take 10 mLs by mouth every 6 (six) hours as needed for cough. 12/12/22   Geroldine Berg, MD  lisinopril  (ZESTRIL ) 20 MG tablet Take 1 tablet (20 mg total) by mouth daily. Patient taking differently: Take 20 mg by mouth daily as needed (high blood presssure). 10/01/20   Lorriane Holmes, MD  meloxicam  (MOBIC ) 15 MG tablet Take 1 tablet (15 mg total) by mouth daily for 14 days. 01/30/24 02/13/24  Cleotilde Rogue, MD    Allergies: Patient has no known allergies.    Review of Systems  Musculoskeletal:  Positive for back pain.  All other systems reviewed and are negative.   Updated Vital Signs BP 118/76 (BP Location: Right Arm)   Pulse (!) 102   Temp 97.6 F (36.4 C) (Oral)   Resp 18   Ht 1.549 m (5' 1)   Wt 52.1 kg   SpO2 98%   BMI 21.70 kg/m   Physical Exam Vitals and nursing note reviewed.  Constitutional:      General: She is not in acute distress.    Appearance: She is well-developed.  HENT:     Head: Normocephalic and atraumatic.     Mouth/Throat:     Pharynx: No oropharyngeal exudate.  Eyes:     General: No scleral icterus.       Right eye: No discharge.        Left eye: No discharge.  Conjunctiva/sclera: Conjunctivae normal.     Pupils: Pupils are equal, round, and reactive to light.  Neck:     Thyroid : No thyromegaly.     Vascular: No JVD.  Cardiovascular:     Rate and Rhythm: Normal rate and regular rhythm.     Heart sounds: Normal heart sounds. No murmur heard.    No friction rub. No gallop.  Pulmonary:     Effort: Pulmonary effort is normal. No respiratory distress.     Breath sounds: Normal breath sounds. No wheezing or rales.  Abdominal:     General: Bowel sounds are normal. There is no distension.     Palpations: Abdomen is soft. There is no mass.     Tenderness: There is no abdominal tenderness.  Musculoskeletal:        General: No tenderness. Normal range of motion.     Cervical  back: Normal range of motion and neck supple.     Right lower leg: No edema.     Left lower leg: No edema.     Comments: I have been able to move both patient's legs around without difficulty, she has normal passive range of motion of the hips knees and ankles but also has the ability to straight leg raise flex and extend his knees and dorsiflex and plantarflex at the ankles with normal strength.  Sensation is bilaterally symmetrical equal and normal and she has preserved patellar reflexes.  Lymphadenopathy:     Cervical: No cervical adenopathy.  Skin:    General: Skin is warm and dry.     Findings: No erythema or rash.  Neurological:     General: No focal deficit present.     Mental Status: She is alert.     Coordination: Coordination normal.  Psychiatric:        Behavior: Behavior normal.     (all labs ordered are listed, but only abnormal results are displayed) Labs Reviewed - No data to display  EKG: None  Radiology: No results found.   Procedures   Medications Ordered in the ED  oxyCODONE -acetaminophen  (PERCOCET/ROXICET) 5-325 MG per tablet 2 tablet (2 tablets Oral Given 01/30/24 1205)                                    Medical Decision Making Risk Prescription drug management.   Unfortunately this patient has some degree of some developing chronic pain mostly on the left side, this could be sciatic in nature but she has no focal neurologic deficits.  She has a follow-up scheduled with her surgeon, I think this is the best pathway forward for her.  She had an MRI clarifying that there was no acute surgical pathology which was also important.  She will be given 10 tablets of Percocet for home and informed that she will likely need to see a pain clinic if this is not helping.  I see no reason to admit the patient obtain neurosurgical emergent evaluation or transfer.  She is agreeable to the plan to follow-up with Dr. Gillie     Final diagnoses:  Sciatica, left side     ED Discharge Orders          Ordered    meloxicam  (MOBIC ) 15 MG tablet  Daily        01/30/24 1204    oxyCODONE -acetaminophen  (PERCOCET/ROXICET) 5-325 MG tablet  Every 6 hours PRN  01/30/24 1204               Cleotilde Rogue, MD 01/30/24 [phone]

## 2024-01-30 NOTE — ED Triage Notes (Signed)
 Pt arrived via POV c/o recurrent left lower back pain with radiation down her left leg. Pt reports being seen here recently and the medications have not helped.

## 2024-02-07 ENCOUNTER — Other Ambulatory Visit (HOSPITAL_COMMUNITY): Payer: Self-pay | Admitting: Neurosurgery

## 2024-02-07 DIAGNOSIS — M4316 Spondylolisthesis, lumbar region: Secondary | ICD-10-CM

## 2024-02-17 ENCOUNTER — Encounter (HOSPITAL_COMMUNITY): Payer: Self-pay | Admitting: *Deleted

## 2024-02-17 ENCOUNTER — Emergency Department (HOSPITAL_COMMUNITY)

## 2024-02-17 ENCOUNTER — Other Ambulatory Visit: Payer: Self-pay

## 2024-02-17 ENCOUNTER — Emergency Department (HOSPITAL_COMMUNITY)
Admission: EM | Admit: 2024-02-17 | Discharge: 2024-02-17 | Disposition: A | Discharge status: Home / Self Care | Attending: Emergency Medicine | Admitting: Emergency Medicine

## 2024-02-17 DIAGNOSIS — I10 Essential (primary) hypertension: Secondary | ICD-10-CM | POA: Diagnosis not present

## 2024-02-17 DIAGNOSIS — M545 Low back pain, unspecified: Secondary | ICD-10-CM | POA: Diagnosis present

## 2024-02-17 DIAGNOSIS — Z79899 Other long term (current) drug therapy: Secondary | ICD-10-CM | POA: Diagnosis not present

## 2024-02-17 DIAGNOSIS — R109 Unspecified abdominal pain: Secondary | ICD-10-CM | POA: Diagnosis not present

## 2024-02-17 DIAGNOSIS — J4489 Other specified chronic obstructive pulmonary disease: Secondary | ICD-10-CM | POA: Diagnosis not present

## 2024-02-17 DIAGNOSIS — G8929 Other chronic pain: Secondary | ICD-10-CM | POA: Diagnosis not present

## 2024-02-17 DIAGNOSIS — F17200 Nicotine dependence, unspecified, uncomplicated: Secondary | ICD-10-CM | POA: Insufficient documentation

## 2024-02-17 LAB — URINALYSIS, ROUTINE W REFLEX MICROSCOPIC
Bilirubin Urine: NEGATIVE
Glucose, UA: NEGATIVE mg/dL
Hgb urine dipstick: NEGATIVE
Ketones, ur: NEGATIVE mg/dL
Leukocytes,Ua: NEGATIVE
Nitrite: NEGATIVE
Protein, ur: NEGATIVE mg/dL
Specific Gravity, Urine: 1.02 (ref 1.005–1.030)
pH: 6 (ref 5.0–8.0)

## 2024-02-17 LAB — COMPREHENSIVE METABOLIC PANEL WITH GFR
ALT: 12 U/L (ref 0–44)
AST: 23 U/L (ref 15–41)
Albumin: 3.8 g/dL (ref 3.5–5.0)
Alkaline Phosphatase: 160 U/L — ABNORMAL HIGH (ref 38–126)
Anion gap: 14 (ref 5–15)
BUN: 10 mg/dL (ref 8–23)
CO2: 24 mmol/L (ref 22–32)
Calcium: 8.6 mg/dL — ABNORMAL LOW (ref 8.9–10.3)
Chloride: 100 mmol/L (ref 98–111)
Creatinine, Ser: 0.39 mg/dL — ABNORMAL LOW (ref 0.44–1.00)
GFR, Estimated: >60 mL/min
Glucose, Bld: 90 mg/dL (ref 70–99)
Potassium: 3.3 mmol/L — ABNORMAL LOW (ref 3.5–5.1)
Sodium: 138 mmol/L (ref 135–145)
Total Bilirubin: 0.2 mg/dL (ref 0.0–1.2)
Total Protein: 7 g/dL (ref 6.5–8.1)

## 2024-02-17 LAB — CBC
HCT: 44.8 % (ref 36.0–46.0)
Hemoglobin: 14.5 g/dL (ref 12.0–15.0)
MCH: 31.9 pg (ref 26.0–34.0)
MCHC: 32.4 g/dL (ref 30.0–36.0)
MCV: 98.7 fL (ref 80.0–100.0)
Platelets: 382 K/uL (ref 150–400)
RBC: 4.54 MIL/uL (ref 3.87–5.11)
RDW: 12.7 % (ref 11.5–15.5)
WBC: 3.7 K/uL — ABNORMAL LOW (ref 4.0–10.5)
nRBC: 0 % (ref 0.0–0.2)

## 2024-02-17 LAB — LIPASE, BLOOD: Lipase: <10 U/L — ABNORMAL LOW (ref 11–51)

## 2024-02-17 MED ORDER — IOHEXOL 300 MG/ML  SOLN
100.0000 mL | Freq: Once | INTRAMUSCULAR | Status: AC | PRN
  Administered 2024-02-17: 100 mL via INTRAVENOUS

## 2024-02-17 MED ORDER — OXYCODONE-ACETAMINOPHEN 5-325 MG PO TABS
2.0000 | ORAL_TABLET | Freq: Once | ORAL | Status: AC
  Administered 2024-02-17: 2 via ORAL
  Filled 2024-02-17: qty 2

## 2024-02-17 MED ORDER — CYCLOBENZAPRINE HCL 5 MG PO TABS: 5.0000 mg | ORAL_TABLET | Freq: Two times a day (BID) | ORAL | 0 refills | Status: AC | PRN

## 2024-02-17 MED ORDER — ACETAMINOPHEN 325 MG PO TABS: 650.0000 mg | ORAL_TABLET | Freq: Four times a day (QID) | ORAL | 0 refills | Status: AC | PRN

## 2024-02-17 MED ORDER — LIDOCAINE 5 % EX PTCH: 1.0000 | MEDICATED_PATCH | Freq: Every day | CUTANEOUS | 0 refills | Status: AC | PRN

## 2024-02-17 MED ORDER — IBUPROFEN 600 MG PO TABS: 600.0000 mg | ORAL_TABLET | Freq: Four times a day (QID) | ORAL | 0 refills | Status: AC | PRN

## 2024-02-17 NOTE — ED Notes (Signed)
 Patient transported to CT

## 2024-02-17 NOTE — Discharge Instructions (Addendum)
 Please follow-up with your Primary Care Physician within the next week. Please take your medications as instructed and discuss any changes to your medications with your primary care physician.    Please return to the Emergency Department if you have any leg numbness, leg weakness, difficulty walking, fevers, worsening of pain, lightheadedness, lose consciousness, severe abdominal pain, severe headache, difficulty urinating, or difficulty having a bowel movement.   Please return to the emergency department immediately for any new or concerning symptoms, or if you get worse.     RESOURCE GUIDE  Chronic Pain Problems: Contact [facility]  [phone] Patients need to be referred by their primary care doctor.  Insufficient Money for Medicine: Contact [employer]:  call [phone]  No Primary Care Doctor: Call [facility]  [phone] - can help you locate a primary care doctor that  accepts your insurance, provides certain services, etc. Physician Referral Service- [phone]  Agencies that provide inexpensive medical care: [facility]  [phone] [facility]  [phone] [facility]  [phone] [facility]  [phone] [facility]  [phone] [facility]  [phone]  Medicaid-accepting [facility] Providers: [facility]- [address], [address]  [phone], Mon-Fri 9am-7pm, Sat 9am-1pm [facility]- [address] [place], [address] [place]  [phone] [facility]- [address], [address] [place]  [phone] [facility]- [address]  [phone] Kennieth Leech- [address] [place], [address], [phone]  Only accepts [place] patients after they have their name  applied to their card  Self Pay (no insurance) in [facility]: Sickle Cell Patients - [facility]  [address] [place], [phone] [facility]  Urgent Care- [address] Kevork Joyce  [phone]       [place] [facility] [place]- [address]       -     [facility]- see information above (Speak to [employer] if you do not have insurance)       -  [facility]- [address],  [phone]       -  [facility]- [address], [phone]       -  Dr [facility]ina-  [address], [place], [phone]       -  Urgent Medical and [facility] - [address] [phone]       -  [facility]- [address], [phone], also [address], [phone]       -     [facility]- [address] [place], [phone], 1st & 3rd Saturday         every month, 10am-1pm  -     [facility] and [facility]   [address] [place], [place].   Phone:  [phone], Fax:  [phone]. Hours of Operation:  9 am - 6 pm, M-F.  -     [facility]   [address], [address], [place]   Phone: [phone], Fax: [phone]. Hours of Operation:  8:30 am - 5:30 pm, M-F.    Dental Assistance If unable to pay or uninsured, contact:  [facility]. to become qualified for the adult dental clinic.  Patients with Medicaid: [facility] [phone] [address], [phone]  MICAEL Jama Cassis, [phone]  If unable to pay, or uninsured, contact [facility] [phone] in [place], [phone] in [facility]) to become qualified for the adult dental clinic  [facility] [address] [place], [place] [address] [phone] [email or web address]  Other Proofreader Services: [facility]- [address] [place], [place], [place], [address], [phone], Ext. [phone], 2nd and 4th Thursday of the month at 6:30am.  10 clients each day by appointment, can sometimes see walk-in patients if someone does not show for an appointment. [facility]- [address] [place], [place],  [address], [phone] [facility] [address], [place], [place], [address], [phone] [facility] Department- [phone] [facility] Health Department- [phone] [facility] [phone]

## 2024-02-17 NOTE — ED Triage Notes (Signed)
 Pt BIB [facility] for abd pain and back pain ongoing for a month. Hx of back surgery.

## 2024-02-17 NOTE — ED Provider Notes (Signed)
 " [place] EMERGENCY DEPARTMENT AT [facility] Provider Note  CSN: [id number] Arrival date & time: 02/17/24 1437  Chief Complaint(s) Abdominal Pain  HPI Cassandra Montoya is a 68 y.o. female with past medical history as below, significant for asthma, COPD, arthritis, spondylolisthesis, adjustment disorder, hypertension who presents to the ED with complaint of abdominal pain, back pain for a month  She was seen in the ER on 12/11, 11/30 and 11/27 with low back pain  She received a prescription for Percocet 11/20 and 12/11 from the ER. She also received 30 tablets of oxycodone  on 12/15 from neurosurgery Dr. Gillie.   Patient is here today because her pain is ongoing, she has use last of her oxycodone  that Dr. Cabbell prescribed her.  No recent falls or injuries, no changes to bowel or bladder function.  No sensation changes to her legs that are acute.  Pain has not acutely changed in the interim.  No fevers, chills, nausea or vomiting.  Pain is essentially unchanged at this time.  She is due to see Dr. Lindalee next month for further evaluation and possible further diagnostics per patient.  Past Medical History Past Medical History:  Diagnosis Date   Arthritis    in back   Asthma    COPD (chronic obstructive pulmonary disease) (HCC)    Hypertension    Ovarian cyst    Right ovarian cyst,    Pneumonia    Patient Active Problem List   Diagnosis Date Noted   Spondylolisthesis of lumbar region 05/03/2021   Adjustment disorder with depressed mood 01/17/2021   Anosmia 01/17/2021   Anxiety 01/17/2021   Chronic pain 01/17/2021   Essential (primary) hypertension 01/17/2021   Generalized anxiety disorder 01/17/2021   Loss of taste 01/17/2021   Major depression 01/17/2021   Sciatica 01/17/2021   Gastric ulcer 05/26/2018   Abdominal pain, epigastric 01/30/2018   Abnormal weight loss 01/30/2018   Dysphagia 01/30/2018   Loss of appetite 01/30/2018   Nonpulsatile abdominal mass  01/30/2018   Influenza A 02/14/2017   Hypokalemia 02/14/2017   Hyperglycemia, drug-induced 08/13/2015   COPD with acute exacerbation (HCC) 08/12/2015   Acute respiratory distress 08/12/2015   Tobacco abuse 05/25/2014   Home Medication(s) Prior to Admission medications  Medication Sig Start Date End Date Taking? Authorizing Provider  acetaminophen  (TYLENOL ) 325 MG tablet Take 2 tablets (650 mg total) by mouth every 6 (six) hours as needed. 02/17/24  Yes Elnor Savant A, DO  cyclobenzaprine  (FLEXERIL ) 5 MG tablet Take 1 tablet (5 mg total) by mouth 2 (two) times daily as needed for muscle spasms. 02/17/24  Yes Elnor Savant LABOR, DO  ibuprofen  (ADVIL ) 600 MG tablet Take 1 tablet (600 mg total) by mouth every 6 (six) hours as needed. 02/17/24  Yes Elnor Savant A, DO  lidocaine  (LIDODERM ) 5 % Place 1 patch onto the skin daily as needed. Remove & Discard patch within 12 hours or as directed by MD 02/17/24  Yes Elnor Savant LABOR, DO  albuterol  (PROVENTIL  HFA;VENTOLIN  HFA) 108 (90 Base) MCG/ACT inhaler Inhale 1-2 puffs into the lungs every 6 (six) hours as needed for wheezing or shortness of breath.    [provider]  albuterol  (PROVENTIL ) (2.5 MG/3ML) 0.083% nebulizer solution Take 3 mLs (2.5 mg total) by nebulization every 6 (six) hours as needed for wheezing or shortness of breath. 07/24/20   Triplett, Tammy, PA-C  azithromycin  (ZITHROMAX ) 250 MG tablet Take 1 tablet (250 mg total) by mouth daily. 12/12/22  Geroldine Berg, MD  Fluticasone -Umeclidin-Vilant 100-62.5-25 MCG/INH AEPB Inhale 1 puff into the lungs daily. Trelegy Patient not taking: Reported on 05/01/2021    [provider]  guaiFENesin -codeine  100-10 MG/5ML syrup Take 10 mLs by mouth every 6 (six) hours as needed for cough. 12/12/22   Geroldine Berg, MD  lisinopril  (ZESTRIL ) 20 MG tablet Take 1 tablet (20 mg total) by mouth daily. Patient taking differently: Take 20 mg by mouth daily as needed (high blood presssure). 10/01/20    Lorriane Holmes, MD  oxyCODONE -acetaminophen  (PERCOCET/ROXICET) 5-325 MG tablet Take 1 tablet by mouth every 6 (six) hours as needed for severe pain (pain score 7-10). 01/30/24   Cleotilde Rogue, MD                                                                                                                                    Past Surgical History Past Surgical History:  Procedure Laterality Date   BIOPSY  03/26/2018   Procedure: BIOPSY;  Surgeon: Shaaron Lamar HERO, MD;  [facility];  Service: Endoscopy;;  gastric   BIOPSY  08/13/2018   Procedure: BIOPSY;  Surgeon: Shaaron Lamar HERO, MD;  Location: [facility];  Service: Endoscopy;;  gastric   COLONOSCOPY N/A 03/26/2018   Dr. 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  10-year screening colonoscopy   ESOPHAGOGASTRODUODENOSCOPY N/A 03/26/2018   Dr. Thersia: Esophagitis, mild Schatzki ring, gastric ulcer with multiple satellite erosions.  Esophagus was dilated.  Gastric biopsies were benign.  No H. pylori.  Recommend 17-month surveillance EGD.   ESOPHAGOGASTRODUODENOSCOPY N/A 08/13/2018   Procedure: ESOPHAGOGASTRODUODENOSCOPY (EGD);  Surgeon: Shaaron Lamar HERO, MD;  Location: [facility];  Service: Endoscopy;  Laterality: N/A;  12:15pm   MALONEY DILATION N/A 03/26/2018   Procedure: AGAPITO DILATION;  Surgeon: Shaaron Lamar HERO, MD;  Location: [facility];  Service: Endoscopy;  Laterality: N/A;   MALONEY DILATION N/A 08/13/2018   Procedure: AGAPITO DILATION;  Surgeon: Shaaron Lamar HERO, MD;  Location: [facility];  Service: Endoscopy;  Laterality: N/A;   TUBAL LIGATION     Family History Family History  Problem Relation Age of Onset   Stroke Brother    Hypertension Mother    Colon cancer Neg Hx     Social History Social History[1] Allergies Patient has no known allergies.  Review of Systems A thorough review of systems was obtained and all systems are negative except as noted in the HPI and PMH.   Physical Exam Vital  Signs  I have reviewed the triage vital signs BP (!) 148/78   Pulse 68   Temp 97.8 F (36.6 C) (Oral)   Resp 18   Ht 5' 1 (1.549 m)   Wt 49.9 kg   SpO2 94%   BMI 20.78 kg/m  Physical Exam Vitals and nursing note reviewed.  Constitutional:      General: She is not in acute  distress.    Appearance: Normal appearance. She is well-developed. She is not ill-appearing.  HENT:     Head: Normocephalic and atraumatic.     Right Ear: External ear normal.     Left Ear: External ear normal.     Nose: Nose normal.     Mouth/Throat:     Mouth: Mucous membranes are moist.  Eyes:     General: No scleral icterus.       Right eye: No discharge.        Left eye: No discharge.  Cardiovascular:     Rate and Rhythm: Normal rate.  Pulmonary:     Effort: Pulmonary effort is normal. No respiratory distress.     Breath sounds: No stridor.  Abdominal:     General: Abdomen is flat. There is no distension.     Tenderness: There is no guarding.  Musculoskeletal:        General: No deformity.     Cervical back: No rigidity.  Skin:    General: Skin is warm and dry.     Coloration: Skin is not cyanotic, jaundiced or pale.  Neurological:     Mental Status: She is alert and oriented to person, place, and time.     GCS: GCS eye subscore is 4. GCS verbal subscore is 5. GCS motor subscore is 6.     Cranial Nerves: No dysarthria or facial asymmetry.     Sensory: No sensory deficit.     Motor: No weakness or tremor.     Coordination: Coordination normal.     Comments: Moving extremities spontaneously  Psychiatric:        Speech: Speech normal.        Behavior: Behavior normal. Behavior is cooperative.     ED Results and Treatments Labs (all labs ordered are listed, but only abnormal results are displayed) Labs Reviewed  LIPASE, BLOOD - Abnormal; Notable for the following components:      Result Value   Lipase <10 (*)    All other components within normal limits  COMPREHENSIVE METABOLIC PANEL  WITH GFR - Abnormal; Notable for the following components:   Potassium 3.3 (*)    Creatinine, Ser 0.39 (*)    Calcium 8.6 (*)    Alkaline Phosphatase 160 (*)    All other components within normal limits  CBC - Abnormal; Notable for the following components:   WBC 3.7 (*)    All other components within normal limits  URINALYSIS, ROUTINE W REFLEX MICROSCOPIC                                                                                                                          Radiology CT L-SPINE NO CHARGE Result Date: 02/17/2024 EXAM: CT OF THE LUMBAR SPINE WITHOUT CONTRAST 02/17/2024 07:43:46 PM TECHNIQUE: CT of the lumbar spine was performed without the administration of intravenous contrast. Multiplanar reformatted images are provided for review. Automated exposure control, iterative reconstruction, and/or weight based adjustment of  the mA/kV was utilized to reduce the radiation dose to as low as reasonably achievable. COMPARISON: None available. CLINICAL HISTORY: FINDINGS: BONES AND ALIGNMENT: Normal vertebral body heights. Grade 1 anterolisthesis at L4-L5. Status post L4-L5 PLIF (Posterior Lumbar Interbody Fusion) with no hardware abnormality. No acute fracture or suspicious bone lesion. DEGENERATIVE CHANGES: Severe right L5-S1 facet arthrosis with severe right foraminal stenosis. Moderate bilateral L4 neural foraminal stenosis. Moderate left L5 neural foraminal stenosis. SOFT TISSUES: No acute abnormality. IMPRESSION: 1. Grade 1 anterolisthesis at L4-L5, status post L4-L5 PLIF with no hardware abnormality. 2. Severe right L5-S1 facet arthrosis with severe right foraminal stenosis. 3. Moderate bilateral L4 and moderate left L5 neural foraminal stenosis. Electronically signed by: Franky Stanford MD 02/17/2024 10:03 PM EST RP Workstation: [id number]   CT ABDOMEN PELVIS W CONTRAST Result Date: 02/17/2024 EXAM: CT ABDOMEN AND PELVIS WITH CONTRAST 02/17/2024 07:43:46 PM TECHNIQUE: CT of the abdomen  and pelvis was performed with the administration of 100 mL of iohexol  (OMNIPAQUE ) 300 MG/ML solution. Multiplanar reformatted images are provided for review. Automated exposure control, iterative reconstruction, and/or weight-based adjustment of the mA/kV was utilized to reduce the radiation dose to as low as reasonably achievable. COMPARISON: 08/18/2020 CLINICAL HISTORY: Abdominal pain, acute, nonlocalized. FINDINGS: LOWER CHEST: No acute abnormality. LIVER: The liver is unremarkable. GALLBLADDER AND BILE DUCTS: Gallbladder is unremarkable. No biliary ductal dilatation. SPLEEN: No acute abnormality. PANCREAS: No acute abnormality. ADRENAL GLANDS: No acute abnormality. KIDNEYS, URETERS AND BLADDER: No stones in the kidneys or ureters. No hydronephrosis. No perinephric or periureteral stranding. Urinary bladder is unremarkable. GI AND BOWEL: Stomach demonstrates no acute abnormality. There is no bowel obstruction. PERITONEUM AND RETROPERITONEUM: No ascites. No free air. VASCULATURE: Aorta is normal in caliber. LYMPH NODES: No lymphadenopathy. REPRODUCTIVE ORGANS: No acute abnormality. BONES AND SOFT TISSUES: Grade 1 anterolisthesis at L4-L5; status post L4-L5 PLIF. Chronic height loss at L2. No focal soft tissue abnormality. IMPRESSION: 1. No acute findings in the abdomen or pelvis. Electronically signed by: Franky Stanford MD 02/17/2024 10:01 PM EST RP Workstation: [id number]    Pertinent labs & imaging results that were available during my care of the patient were reviewed by me and considered in my medical decision making (see MDM for details).  Medications Ordered in ED Medications  iohexol  (OMNIPAQUE ) 300 MG/ML solution 100 mL (100 mLs Intravenous Contrast Given 02/17/24 1929)  oxyCODONE -acetaminophen  (PERCOCET/ROXICET) 5-325 MG per tablet 2 tablet (2 tablets Oral Given 02/17/24 2252)                                                                                                                                      Procedures Procedures  (including critical care time)  Medical Decision Making / ED Course    Medical Decision Making:    Cassandra Montoya is a 68 y.o. female with past medical history as below, significant for asthma, COPD, arthritis, spondylolisthesis, adjustment disorder, hypertension who presents  to the ED with complaint of abdominal pain, back pain for a month. The complaint involves an extensive differential diagnosis and also carries with it a high risk of complications and morbidity.  Serious etiology was considered. Ddx includes but is not limited to: Differential diagnosis includes but is not exclusive to musculoskeletal back pain, renal colic, urinary tract infection, pyelonephritis, intra-abdominal causes of back pain, aortic aneurysm or dissection, cauda equina syndrome, sciatica, lumbar disc disease, thoracic disc disease, ovarian cyst, ovarian torsion, acute appendicitis, urinary tract infection, endometriosis, bowel obstruction, hernia, colitis, renal colic, gastroenteritis, volvulus etc.   Complete initial physical exam performed, notably the patient was in no acute stress, sitting comfortably.    Reviewed and confirmed nursing documentation for past medical history, family history, social history.  Vital signs reviewed.    Back pain Abdominal pain> - She received a prescription for Percocet 11/20 and 12/11 from the ER. She also received 30 tablets of oxycodone  on 12/15 from neurosurgery Dr. Cabbell per PDMP - concern developing chronic pain at this point, given information regarding pain clinic - She had recent MRI which was reassuring - Labs and imaging here today are stable, we will give her a dose of Percocet here, encouraged her to follow-up with her surgery and with pain management as her pain appears more chronic in nature at this time. - I have sent nonnecrotic pain medication to our pharmacy  Clinical Course as of 02/17/24 2254  Mon Feb 17, 2024  1856  Labs stable [SG]  2211 CT stable [SG]    Clinical Course User Index [SG] Elnor Jayson LABOR, DO   10:54 PM:  I have discussed the diagnosis/risks/treatment options with the patient and family.  Evaluation and diagnostic testing in the emergency department does not suggest an emergent condition requiring admission or immediate intervention beyond what has been performed at this time.  They will follow up with pcp/pain mgmt/ nsgy. We also discussed returning to the ED immediately if new or worsening sx occur. We discussed the sx which are most concerning (e.g., sudden worsening pain, fever, inability to tolerate by mouth) that necessitate immediate return.    The patient appears reasonably screened and/or stabilized for discharge and I doubt any other medical condition or other [facility] requiring further screening, evaluation, or treatment in the ED at this time prior to discharge.                  Additional history obtained: -Additional history obtained from family -External records from outside source obtained and reviewed including: Chart review including previous notes, labs, imaging, consultation notes including  MRI lumbar spine on 01/19/2024 Neurosurgery documentation, surgery 3/23 with Dr. Gillie, lumbar interbody fusion  Lab Tests: -I ordered, reviewed, and interpreted labs.   The pertinent results include:   Labs Reviewed  LIPASE, BLOOD - Abnormal; Notable for the following components:      Result Value   Lipase <10 (*)    All other components within normal limits  COMPREHENSIVE METABOLIC PANEL WITH GFR - Abnormal; Notable for the following components:   Potassium 3.3 (*)    Creatinine, Ser 0.39 (*)    Calcium 8.6 (*)    Alkaline Phosphatase 160 (*)    All other components within normal limits  CBC - Abnormal; Notable for the following components:   WBC 3.7 (*)    All other components within normal limits  URINALYSIS, ROUTINE W REFLEX MICROSCOPIC    Notable for  labs stable  EKG  EKG Interpretation Date/Time:    Ventricular Rate:    PR Interval:    QRS Duration:    QT Interval:    QTC Calculation:   R Axis:      Text Interpretation:           Imaging Studies ordered: I ordered imaging studies including CTL CTAP I independently visualized the following imaging with scope of interpretation limited to determining acute life threatening conditions related to emergency care; findings noted above I agree with the radiologist interpretation If any imaging was obtained with contrast I closely monitored patient for any possible adverse reaction a/w contrast administration in the emergency department   Medicines ordered and prescription drug management: Meds ordered this encounter  Medications   iohexol  (OMNIPAQUE ) 300 MG/ML solution 100 mL   oxyCODONE -acetaminophen  (PERCOCET/ROXICET) 5-325 MG per tablet 2 tablet    Refill:  0   cyclobenzaprine  (FLEXERIL ) 5 MG tablet    Sig: Take 1 tablet (5 mg total) by mouth 2 (two) times daily as needed for muscle spasms.    Dispense:  20 tablet    Refill:  0   lidocaine  (LIDODERM ) 5 %    Sig: Place 1 patch onto the skin daily as needed. Remove & Discard patch within 12 hours or as directed by MD    Dispense:  15 patch    Refill:  0   acetaminophen  (TYLENOL ) 325 MG tablet    Sig: Take 2 tablets (650 mg total) by mouth every 6 (six) hours as needed.    Dispense:  36 tablet    Refill:  0   ibuprofen  (ADVIL ) 600 MG tablet    Sig: Take 1 tablet (600 mg total) by mouth every 6 (six) hours as needed.    Dispense:  30 tablet    Refill:  0    -I have reviewed the patients home medicines and have made adjustments as needed   Consultations Obtained: na   Cardiac Monitoring: Continuous pulse oximetry interpreted by myself, 98% on RA.    Social Determinants of Health:  Diagnosis or treatment significantly limited by social determinants of health: current smoker   Reevaluation: After the  interventions noted above, I reevaluated the patient and found that they have improved  Co morbidities that complicate the patient evaluation  Past Medical History:  Diagnosis Date   Arthritis    in back   Asthma    COPD (chronic obstructive pulmonary disease) (HCC)    Hypertension    Ovarian cyst    Right ovarian cyst,    Pneumonia       Dispostion: Disposition decision including need for hospitalization was considered, and patient discharged from emergency department.    Final Clinical Impression(s) / ED Diagnoses Final diagnoses:  Chronic low back pain, unspecified back pain laterality, unspecified whether sciatica present          [1]  Social History Tobacco Use   Smoking status: Every Day    Current packs/day: 0.50    Types: Cigarettes    Passive exposure: Current   Smokeless tobacco: Never  Vaping Use   Vaping status: Never Used  Substance Use Topics   Alcohol use: No   Drug use: No     Elnor Jayson LABOR, DO 02/17/24 2254  "

## 2024-02-26 ENCOUNTER — Ambulatory Visit (HOSPITAL_COMMUNITY)
Admission: RE | Admit: 2024-02-26 | Discharge: 2024-02-26 | Disposition: A | Discharge status: Home / Self Care | Source: Ambulatory Visit | Attending: Neurosurgery | Admitting: Neurosurgery

## 2024-02-26 DIAGNOSIS — M4316 Spondylolisthesis, lumbar region: Secondary | ICD-10-CM | POA: Insufficient documentation

## 2024-02-26 DIAGNOSIS — M4856XD Collapsed vertebra, not elsewhere classified, lumbar region, subsequent encounter for fracture with routine healing: Secondary | ICD-10-CM | POA: Diagnosis not present

## 2024-02-26 DIAGNOSIS — M48061 Spinal stenosis, lumbar region without neurogenic claudication: Secondary | ICD-10-CM | POA: Insufficient documentation

## 2024-02-26 DIAGNOSIS — Z981 Arthrodesis status: Secondary | ICD-10-CM | POA: Diagnosis not present

## 2024-02-26 DIAGNOSIS — M4858XD Collapsed vertebra, not elsewhere classified, sacral and sacrococcygeal region, subsequent encounter for fracture with routine healing: Secondary | ICD-10-CM | POA: Insufficient documentation

## 2024-02-26 MED ORDER — OXYCODONE HCL 5 MG PO TABS: 5.0000 mg | ORAL_TABLET | ORAL | Status: DC | PRN

## 2024-02-26 MED ORDER — IOHEXOL 180 MG/ML  SOLN
20.0000 mL | Freq: Once | INTRAMUSCULAR | Status: AC | PRN
  Administered 2024-02-26: 20 mL via INTRATHECAL

## 2024-02-26 MED ORDER — ONDANSETRON HCL 4 MG/2ML IJ SOLN: 4.0000 mg | Freq: Four times a day (QID) | INTRAMUSCULAR | Status: DC | PRN

## 2024-02-26 MED ORDER — LIDOCAINE HCL (PF) 1 % IJ SOLN
5.0000 mL | Freq: Once | INTRAMUSCULAR | Status: AC
  Administered 2024-02-26: 5 mL via INTRADERMAL

## 2024-02-26 NOTE — Progress Notes (Signed)
 Discharge instructions reviewed with patient and sister Lauraine at the bedside denies questions or concerns. No s/s of complications at the incision site. PT ambulated to the bathroom was able to void without difficulty. PT tolerated PO intake no complaints of n/v. PT escorted from the unit via wheel chair to personal vehicle.

## 2024-02-26 NOTE — Discharge Instructions (Signed)
Myelogram and Lumbar Puncture Discharge Instructions  Go home and rest quietly for the next 24 hours.  It is important to lie flat for the next 24 hours.  Get up only to go to the restroom.  You may lie in the bed or on a couch on your back, your stomach, your left side or your right side.  You may have one pillow under your head.  You may have pillows between your knees while you are on your side or under your knees while you are on your back.  DO NOT drive today.  Recline the seat as far back as it will go, while still wearing your seat belt, on the way home.  You may get up to go to the bathroom as needed.  You may sit up for 10 minutes to eat.  You may resume your normal diet and medications unless otherwise indicated.  The incidence of headache, nausea, or vomiting is about 5% (one in 20 patients).  If you develop a headache, lie flat and drink plenty of fluids until the headache goes away.  Caffeinated beverages may be helpful.  If you develop severe nausea and vomiting or a headache that does not go away with flat bed rest, call [phone].  You may resume normal activities after your 24 hours of bed rest is over; however, do not exert yourself strongly or do any heavy lifting tomorrow.  Call your physician for a follow-up appointment.  The results of your myelogram will be sent directly to your physician by the following day.  Discharge instructions have been explained to the patient.  The patient, or the person responsible for the patient, fully understands these instructions.   

## 2024-02-26 NOTE — Op Note (Signed)
*   No surgery found * lumbar Myelogram  PATIENT:  Cassandra Montoya is a 69 y.o. female With lumbar stenosis and neurogenic claudication PRE-OPERATIVE DIAGNOSIS:  lumbar stenosis  POST-OPERATIVE DIAGNOSIS:  same  PROCEDURE:  Lumbar Myelogram  SURGEON:  Areesha Dehaven  ANESTHESIA:   local LOCAL MEDICATIONS USED:  LIDOCAINE   Procedure Note: Cassandra Montoya is a 69 y.o. female Was taken to the fluoroscopy suite and  positioned prone on the fluoroscopy table. her back was prepared and draped in a sterile manner. I infiltrated 6 cc into the lumbar region. I then introduced a spinal needle into the thecal sac at the L4/5  interlaminar space. I infiltrated 20cc of Isovue  180 into the thecal sac. Fluoroscopy showed the needle and contrast in the thecal sac. Cassandra Montoya Pesa tolerated the procedure well. she Will be taken to CT for evaluation.     PATIENT DISPOSITION:  Short Stay

## 2024-03-08 ENCOUNTER — Emergency Department (HOSPITAL_COMMUNITY)

## 2024-03-08 ENCOUNTER — Emergency Department (HOSPITAL_COMMUNITY): Admission: EM | Admit: 2024-03-08 | Discharge: 2024-03-08 | Disposition: A | Discharge status: Home / Self Care

## 2024-03-08 DIAGNOSIS — Z7951 Long term (current) use of inhaled steroids: Secondary | ICD-10-CM | POA: Insufficient documentation

## 2024-03-08 DIAGNOSIS — R1011 Right upper quadrant pain: Secondary | ICD-10-CM | POA: Diagnosis present

## 2024-03-08 DIAGNOSIS — I10 Essential (primary) hypertension: Secondary | ICD-10-CM | POA: Diagnosis not present

## 2024-03-08 DIAGNOSIS — K2970 Gastritis, unspecified, without bleeding: Secondary | ICD-10-CM | POA: Insufficient documentation

## 2024-03-08 DIAGNOSIS — K5903 Drug induced constipation: Secondary | ICD-10-CM | POA: Diagnosis not present

## 2024-03-08 DIAGNOSIS — Z79899 Other long term (current) drug therapy: Secondary | ICD-10-CM | POA: Diagnosis not present

## 2024-03-08 DIAGNOSIS — J45909 Unspecified asthma, uncomplicated: Secondary | ICD-10-CM | POA: Diagnosis not present

## 2024-03-08 LAB — COMPREHENSIVE METABOLIC PANEL WITH GFR
ALT: 9 U/L (ref 0–44)
AST: 17 U/L (ref 15–41)
Albumin: 3.7 g/dL (ref 3.5–5.0)
Alkaline Phosphatase: 105 U/L (ref 38–126)
Anion gap: 10 (ref 5–15)
BUN: 11 mg/dL (ref 8–23)
CO2: 28 mmol/L (ref 22–32)
Calcium: 8.7 mg/dL — ABNORMAL LOW (ref 8.9–10.3)
Chloride: 104 mmol/L (ref 98–111)
Creatinine, Ser: 0.44 mg/dL (ref 0.44–1.00)
GFR, Estimated: >60 mL/min
Glucose, Bld: 98 mg/dL (ref 70–99)
Potassium: 3.7 mmol/L (ref 3.5–5.1)
Sodium: 142 mmol/L (ref 135–145)
Total Bilirubin: 0.3 mg/dL (ref 0.0–1.2)
Total Protein: 6.8 g/dL (ref 6.5–8.1)

## 2024-03-08 LAB — CBC WITH DIFFERENTIAL/PLATELET
Abs Immature Granulocytes: 0.04 K/uL (ref 0.00–0.07)
Basophils Absolute: 0 K/uL (ref 0.0–0.1)
Basophils Relative: 0 %
Eosinophils Absolute: 0 K/uL (ref 0.0–0.5)
Eosinophils Relative: 0 %
HCT: 41.5 % (ref 36.0–46.0)
Hemoglobin: 13 g/dL (ref 12.0–15.0)
Immature Granulocytes: 0 %
Lymphocytes Relative: 30 %
Lymphs Abs: 3 K/uL (ref 0.7–4.0)
MCH: 31.3 pg (ref 26.0–34.0)
MCHC: 31.3 g/dL (ref 30.0–36.0)
MCV: 99.8 fL (ref 80.0–100.0)
Monocytes Absolute: 1.1 K/uL — ABNORMAL HIGH (ref 0.1–1.0)
Monocytes Relative: 11 %
Neutro Abs: 5.6 K/uL (ref 1.7–7.7)
Neutrophils Relative %: 59 %
Platelets: 486 K/uL — ABNORMAL HIGH (ref 150–400)
RBC: 4.16 MIL/uL (ref 3.87–5.11)
RDW: 12.8 % (ref 11.5–15.5)
WBC: 9.8 K/uL (ref 4.0–10.5)
nRBC: 0 % (ref 0.0–0.2)

## 2024-03-08 LAB — URINALYSIS, ROUTINE W REFLEX MICROSCOPIC
Bilirubin Urine: NEGATIVE
Glucose, UA: NEGATIVE mg/dL
Hgb urine dipstick: NEGATIVE
Ketones, ur: NEGATIVE mg/dL
Leukocytes,Ua: NEGATIVE
Nitrite: NEGATIVE
Protein, ur: NEGATIVE mg/dL
Specific Gravity, Urine: 1.019 (ref 1.005–1.030)
pH: 5 (ref 5.0–8.0)

## 2024-03-08 LAB — LIPASE, BLOOD: Lipase: 12 U/L (ref 11–51)

## 2024-03-08 MED ORDER — MORPHINE SULFATE (PF) 4 MG/ML IV SOLN
4.0000 mg | Freq: Once | INTRAVENOUS | Status: AC
  Administered 2024-03-08: 4 mg via INTRAVENOUS
  Filled 2024-03-08: qty 1

## 2024-03-08 MED ORDER — PANTOPRAZOLE SODIUM 20 MG PO TBEC: 20.0000 mg | DELAYED_RELEASE_TABLET | Freq: Every day | ORAL | 0 refills | Status: AC

## 2024-03-08 MED ORDER — BISACODYL 10 MG RE SUPP
10.0000 mg | Freq: Once | RECTAL | Status: AC
  Administered 2024-03-08: 10 mg via RECTAL
  Filled 2024-03-08: qty 1

## 2024-03-08 MED ORDER — POLYETHYLENE GLYCOL 3350 17 G PO PACK: 17.0000 g | PACK | Freq: Every day | ORAL | 0 refills | Status: AC

## 2024-03-08 MED ORDER — IOHEXOL 300 MG/ML  SOLN
100.0000 mL | Freq: Once | INTRAMUSCULAR | Status: AC | PRN
  Administered 2024-03-08: 100 mL via INTRAVENOUS

## 2024-03-08 MED ORDER — OXYCODONE-ACETAMINOPHEN 5-325 MG PO TABS: 1.0000 | ORAL_TABLET | Freq: Four times a day (QID) | ORAL | 0 refills | Status: AC | PRN

## 2024-03-08 MED ORDER — MAGNESIUM CITRATE PO SOLN
0.5000 | Freq: Once | ORAL | Status: AC
  Administered 2024-03-08: 0.5 via ORAL
  Filled 2024-03-08: qty 296

## 2024-03-08 MED ORDER — ONDANSETRON HCL 4 MG/2ML IJ SOLN
4.0000 mg | Freq: Once | INTRAMUSCULAR | Status: AC
  Administered 2024-03-08: 4 mg via INTRAVENOUS
  Filled 2024-03-08: qty 2

## 2024-03-08 NOTE — ED Provider Notes (Cosign Needed Addendum)
 " [facility] [facility] AT [facility] Provider Note   CSN: [id number] Arrival date & time: 03/08/24  9147     Patient presents with: Abdominal Pain   Cassandra Montoya is a 69 y.o. female with a history significant for hypertension, asthma, history of PUD without significant surgical history presenting with a 4 to 5-day history of waxing and waning right upper quadrant and right lateral abdominal pain.  She has found no alleviators for symptoms but endorses when the pain is severe she has difficulty moving as it makes it worse.  She has had a reduced appetite, she last drink Ensure yesterday which did not affect her pain symptoms.  She has had nausea without emesis.  Her last bowel movement was approximately 5 days ago, she states this is not abnormal for her as she struggles with constipation.  She denies abdominal distention, no chest pain, no shortness of breath.  She has taken no medications for her symptoms.     The history is provided by the patient.       Prior to Admission medications  Medication Sig Start Date End Date Taking? Authorizing Provider  oxyCODONE -acetaminophen  (PERCOCET/ROXICET) 5-325 MG tablet Take 1 tablet by mouth every 6 (six) hours as needed for severe pain (pain score 7-10). 03/08/24  Yes Jeter Tomey, PA-C  pantoprazole  (PROTONIX ) 20 MG tablet Take 1 tablet (20 mg total) by mouth daily. 03/08/24  Yes Axil Copeman, PA-C  polyethylene glycol (MIRALAX ) 17 g packet Take 17 g by mouth daily. 03/08/24  Yes Elsi Stelzer, PA-C  acetaminophen  (TYLENOL ) 325 MG tablet Take 2 tablets (650 mg total) by mouth every 6 (six) hours as needed. 02/17/24   Elnor Jayson LABOR, DO  albuterol  (PROVENTIL  HFA;VENTOLIN  HFA) 108 (90 Base) MCG/ACT inhaler Inhale 1-2 puffs into the lungs every 6 (six) hours as needed for wheezing or shortness of breath.    [provider]  albuterol  (PROVENTIL ) (2.5 MG/3ML) 0.083% nebulizer solution Take 3 mLs (2.5 mg total) by nebulization  every 6 (six) hours as needed for wheezing or shortness of breath. 07/24/20   Triplett, Tammy, PA-C  azithromycin  (ZITHROMAX ) 250 MG tablet Take 1 tablet (250 mg total) by mouth daily. 12/12/22   Geroldine Berg, MD  cyclobenzaprine  (FLEXERIL ) 5 MG tablet Take 1 tablet (5 mg total) by mouth 2 (two) times daily as needed for muscle spasms. 02/17/24   Elnor Jayson LABOR, DO  Fluticasone -Umeclidin-Vilant 100-62.5-25 MCG/INH AEPB Inhale 1 puff into the lungs daily. Trelegy    [provider]  guaiFENesin -codeine  100-10 MG/5ML syrup Take 10 mLs by mouth every 6 (six) hours as needed for cough. 12/12/22   Geroldine Berg, MD  ibuprofen  (ADVIL ) 600 MG tablet Take 1 tablet (600 mg total) by mouth every 6 (six) hours as needed. 02/17/24   Elnor Jayson LABOR, DO  lidocaine  (LIDODERM ) 5 % Place 1 patch onto the skin daily as needed. Remove & Discard patch within 12 hours or as directed by MD 02/17/24   Elnor Jayson LABOR, DO  lisinopril  (ZESTRIL ) 20 MG tablet Take 1 tablet (20 mg total) by mouth daily. Patient taking differently: Take 20 mg by mouth daily as needed (high blood presssure). 10/01/20   Lorriane Holmes, MD    Allergies: Patient has no known allergies.    Review of Systems  Constitutional:  Negative for chills and fever.  HENT:  Negative for congestion and sore throat.   Eyes: Negative.   Respiratory:  Negative for chest tightness and shortness of  breath.   Cardiovascular:  Negative for chest pain.  Gastrointestinal:  Positive for abdominal pain, constipation and nausea. Negative for vomiting.  Genitourinary: Negative.   Musculoskeletal:  Negative for arthralgias, joint swelling and neck pain.  Skin: Negative.  Negative for rash and wound.  Neurological:  Negative for dizziness, weakness, light-headedness, numbness and headaches.  Psychiatric/Behavioral: Negative.      Updated Vital Signs BP (!) 161/85   Pulse 85   Temp 97.8 F (36.6 C)   Resp 20   SpO2 92%   Physical Exam Vitals and  nursing note reviewed. Exam conducted with a chaperone present [employer] Adviser present).  Constitutional:      Appearance: She is well-developed.  HENT:     Head: Normocephalic and atraumatic.  Eyes:     Conjunctiva/sclera: Conjunctivae normal.  Cardiovascular:     Rate and Rhythm: Normal rate and regular rhythm.     Heart sounds: Normal heart sounds.  Pulmonary:     Effort: Pulmonary effort is normal.     Breath sounds: Normal breath sounds. No wheezing.  Abdominal:     General: Bowel sounds are normal. There is no distension.     Palpations: Abdomen is soft.     Tenderness: There is abdominal tenderness in the right upper quadrant. There is no guarding. Negative signs include Murphy's sign.      Comments: Patient is most tender to palpation over her right lateral abdominal wall/lower rib region.  There is no guarding.  Negative Murphy sign.  Genitourinary:    Rectum: Normal. No mass, tenderness or external hemorrhoid.     Comments: No fecal impaction Musculoskeletal:        General: Normal range of motion.     Cervical back: Normal range of motion.  Skin:    General: Skin is warm and dry.  Neurological:     Mental Status: She is alert.     (all labs ordered are listed, but only abnormal results are displayed) Labs Reviewed  CBC WITH DIFFERENTIAL/PLATELET - Abnormal; Notable for the following components:      Result Value   Platelets 486 (*)    Monocytes Absolute 1.1 (*)    All other components within normal limits  COMPREHENSIVE METABOLIC PANEL WITH GFR - Abnormal; Notable for the following components:   Calcium 8.7 (*)    All other components within normal limits  LIPASE, BLOOD  URINALYSIS, ROUTINE W REFLEX MICROSCOPIC    EKG: None  Radiology: CT ABDOMEN PELVIS W CONTRAST Result Date: 03/08/2024 EXAM: CT ABDOMEN AND PELVIS WITH CONTRAST 03/08/2024 04:28:25 PM TECHNIQUE: CT of the abdomen and pelvis was performed with the administration of 100 mL of iohexol   (OMNIPAQUE ) 300 MG/ML solution. Multiplanar reformatted images are provided for review. Automated exposure control, iterative reconstruction, and/or weight-based adjustment of the mA/kV was utilized to reduce the radiation dose to as low as reasonably achievable. COMPARISON: 02/17/2024 CLINICAL HISTORY: Bowel obstruction suspected; constipation, severe pain RUQ. FINDINGS: LOWER CHEST: No acute abnormality. LIVER: The liver is unremarkable. GALLBLADDER AND BILE DUCTS: Gallbladder is unremarkable. No biliary ductal dilatation. SPLEEN: No acute abnormality. PANCREAS: No acute abnormality. ADRENAL GLANDS: No acute abnormality. KIDNEYS, URETERS AND BLADDER: No stones in the kidneys or ureters. No hydronephrosis. No perinephric or periureteral stranding. Urinary bladder is unremarkable. GI AND BOWEL: Wall thickening noted in the distal stomach compatible with gastritis. Moderate stool burden in the colon. There is no bowel obstruction. PERITONEUM AND RETROPERITONEUM: No ascites. No free air. VASCULATURE: Aorta is normal in  caliber. Aortic atherosclerosis. LYMPH NODES: No lymphadenopathy. REPRODUCTIVE ORGANS: No acute abnormality. BONES AND SOFT TISSUES: Small ventral wall hernia in the upper abdomen containing fat and fluid, unchanged since prior study. No acute osseous abnormality. IMPRESSION: 1. Wall thickening in the distal stomach compatible with gastritis. 2. No evidence of bowel obstruction. 3. Moderate stool burden in the colon. Electronically signed by: Franky Crease MD 03/08/2024 05:17 PM EST RP Workstation: [id number]   DG ABD ACUTE 2+V W 1V CHEST Result Date: 03/08/2024 EXAM: UPRIGHT AND SUPINE XRAY VIEWS OF THE ABDOMEN AND FRONTAL VIEW(S) OF THE CHEST 03/08/2024 09:52:25 AM COMPARISON: None available. CLINICAL HISTORY: RUQ abdominal pain, constipation. FINDINGS: LUNGS AND PLEURA: No consolidation or pulmonary edema. No pleural effusion or pneumothorax. HEART AND MEDIASTINUM: No acute abnormality of the  cardiac and mediastinal silhouettes. BOWEL: Moderate colonic fecal material without dilatation. No bowel obstruction. PERITONEUM AND SOFT TISSUES: No abnormal calcifications. No free air. BONES: Lumbar fixation hardware L4-L5. No acute osseous abnormality. IMPRESSION: 1. Moderate colonic stool burden. Nonobstructive bowel gas pattern. Electronically signed by: Michaeline Blanch MD 03/08/2024 10:23 AM EST RP Workstation: [id number]     Procedures   Medications Ordered in the ED  bisacodyl  (DULCOLAX) suppository 10 mg (10 mg Rectal Given 03/08/24 1141)  magnesium  citrate solution 0.5 Bottle (0.5 Bottles Oral Given 03/08/24 1212)  morphine  (PF) 4 MG/ML injection 4 mg (4 mg Intravenous Given 03/08/24 1634)  ondansetron  (ZOFRAN ) injection 4 mg (4 mg Intravenous Given 03/08/24 1634)  iohexol  (OMNIPAQUE ) 300 MG/ML solution 100 mL (100 mLs Intravenous Contrast Given 03/08/24 1621)                                    Medical Decision Making Patient with acute on chronic pain in her right flank and right upper quadrant, differential diagnosis including kidney stone, acute cholecystitis, constipation, bowel obstruction, pyelonephritis, gastritis, diverticulitis although location is a little atypical for this.  Her labs and imaging are reassuring as outlined below.  Patient will be treated for constipation with MiraLAX , also added a PPI given CT suggesting some degree of gastritis.  She is referred to her PCP for recheck if not improving with this treatment plan.  She is also scheduled to see Dr. Lorre for further evaluation of her chronic back pain.  I have given her a few pain pills for her back pain, with strict instructions to use these very sparingly as this can complicate constipation.  Encouraged increase fluid intake.  Amount and/or Complexity of Data Reviewed External Data Reviewed: notes. Labs: ordered.    Details: Labs including urinalysis, CBC, lipase and CMET are all unremarkable. Radiology: ordered.     Details: Acute abdominal series confirming constipation only.  No obvious bowel obstruction.  Proceeded to CT imaging given patient's persistent pain and no relief of constipation after receiving meds including citrate of magnesia and Dulcolax suppository although she did have a very small stool but no improvement in her abdominal pain.  CT scan suggest gastritis and reconfirms constipation without impaction.  Risk OTC drugs. Prescription drug management.        Final diagnoses:  Gastritis without bleeding, unspecified chronicity, unspecified gastritis type  Drug-induced constipation    ED Discharge Orders          Ordered    polyethylene glycol (MIRALAX ) 17 g packet  Daily        03/08/24 1723    pantoprazole  (PROTONIX ) 20 MG  tablet  Daily        03/08/24 1723    oxyCODONE -acetaminophen  (PERCOCET/ROXICET) 5-325 MG tablet  Every 6 hours PRN        03/08/24 1724               Najeeb Uptain, PA-C 03/08/24 1729    Rica Heather, PA-C 03/08/24 1732  "

## 2024-03-08 NOTE — ED Triage Notes (Signed)
 Pt arrives via [facility] from home for RUQ abd pain that started 4-5 days ago with intermittent N/V. Pt received 100 mcg fentanyl  PTA.

## 2024-03-08 NOTE — Discharge Instructions (Signed)
 Your labs and CT scan are reassuring today although you do have significant constipation and it appears you may have an inflammation of your stomach lining, condition called gastritis.  I am prescribing you some medications to help with both of these conditions.  I understand that you have significant back pain chronically but narcotic pain medications can worsen constipation.  I am prescribed you a few pain pills to help you with your back but use these very sparingly and make sure you are taking the MiraLAX  daily to help prevent constipation.  Plan follow-up care with your primary doctor and with Dr. Cabbell for your back issues.

## 2024-03-12 ENCOUNTER — Other Ambulatory Visit: Payer: Self-pay | Admitting: Neurosurgery

## 2024-04-03 ENCOUNTER — Ambulatory Visit (HOSPITAL_COMMUNITY): Admit: 2024-04-03 | Admitting: Neurosurgery

## 2024-04-03 SURGERY — POSTERIOR LUMBAR FUSION 1 LEVEL
Anesthesia: General
